# Patient Record
Sex: Female | Born: 1965 | Race: White | Hispanic: No | Smoking: Current every day smoker
Health system: Southern US, Community
[De-identification: ages and names within clinical notes are randomized; demographics above are authoritative.]

## PROBLEM LIST (undated history)

## (undated) DIAGNOSIS — J449 Chronic obstructive pulmonary disease, unspecified: Secondary | ICD-10-CM

## (undated) DIAGNOSIS — Z803 Family history of malignant neoplasm of breast: Secondary | ICD-10-CM

## (undated) DIAGNOSIS — T7840XA Allergy, unspecified, initial encounter: Secondary | ICD-10-CM

## (undated) DIAGNOSIS — I10 Essential (primary) hypertension: Secondary | ICD-10-CM

## (undated) DIAGNOSIS — Z860100 Personal history of colon polyps, unspecified: Secondary | ICD-10-CM

## (undated) HISTORY — DX: Family history of malignant neoplasm of breast: Z80.3

## (undated) HISTORY — DX: Personal history of colonic polyps: Z86.010

## (undated) HISTORY — DX: Personal history of colon polyps, unspecified: Z86.0100

## (undated) HISTORY — DX: Allergy, unspecified, initial encounter: T78.40XA

## (undated) HISTORY — DX: Essential (primary) hypertension: I10

## (undated) HISTORY — PX: BREAST BIOPSY: SHX20

## (undated) HISTORY — PX: REVISION OF SCAR ON FACE/HEAD: SHX2349

## (undated) SURGERY — Surgical Case
Anesthesia: *Unknown

---

## 2001-06-30 ENCOUNTER — Emergency Department (HOSPITAL_COMMUNITY): Admission: EM | Admit: 2001-06-30 | Discharge: 2001-06-30 | Payer: Self-pay | Admitting: Emergency Medicine

## 2004-09-12 ENCOUNTER — Ambulatory Visit (HOSPITAL_COMMUNITY): Admission: RE | Admit: 2004-09-12 | Discharge: 2004-09-12 | Payer: Self-pay | Admitting: *Deleted

## 2004-09-28 ENCOUNTER — Ambulatory Visit: Payer: Self-pay | Admitting: Obstetrics & Gynecology

## 2004-10-05 ENCOUNTER — Ambulatory Visit (HOSPITAL_COMMUNITY): Admission: RE | Admit: 2004-10-05 | Discharge: 2004-10-05 | Payer: Self-pay | Admitting: *Deleted

## 2004-10-05 ENCOUNTER — Ambulatory Visit: Payer: Self-pay | Admitting: *Deleted

## 2004-10-09 ENCOUNTER — Inpatient Hospital Stay (HOSPITAL_COMMUNITY): Admission: AD | Admit: 2004-10-09 | Discharge: 2004-10-09 | Payer: Self-pay | Admitting: *Deleted

## 2004-10-12 ENCOUNTER — Ambulatory Visit: Payer: Self-pay | Admitting: Obstetrics & Gynecology

## 2004-10-18 ENCOUNTER — Ambulatory Visit: Payer: Self-pay | Admitting: Obstetrics and Gynecology

## 2004-10-18 ENCOUNTER — Inpatient Hospital Stay (HOSPITAL_COMMUNITY): Admission: AD | Admit: 2004-10-18 | Discharge: 2004-10-20 | Payer: Self-pay | Admitting: *Deleted

## 2004-11-02 ENCOUNTER — Ambulatory Visit: Payer: Self-pay | Admitting: Obstetrics and Gynecology

## 2004-12-11 ENCOUNTER — Ambulatory Visit (HOSPITAL_COMMUNITY): Admission: RE | Admit: 2004-12-11 | Discharge: 2004-12-11 | Payer: Self-pay | Admitting: Obstetrics and Gynecology

## 2004-12-11 ENCOUNTER — Ambulatory Visit: Payer: Self-pay | Admitting: Obstetrics and Gynecology

## 2004-12-28 ENCOUNTER — Ambulatory Visit: Payer: Self-pay | Admitting: Obstetrics and Gynecology

## 2011-01-19 ENCOUNTER — Emergency Department (HOSPITAL_COMMUNITY)
Admission: EM | Admit: 2011-01-19 | Discharge: 2011-01-19 | Disposition: A | Discharge status: Home / Self Care | Payer: PRIVATE HEALTH INSURANCE | Attending: Emergency Medicine | Admitting: Emergency Medicine

## 2011-01-19 ENCOUNTER — Emergency Department (HOSPITAL_COMMUNITY): Payer: PRIVATE HEALTH INSURANCE

## 2011-01-19 DIAGNOSIS — R0989 Other specified symptoms and signs involving the circulatory and respiratory systems: Secondary | ICD-10-CM | POA: Insufficient documentation

## 2011-01-19 DIAGNOSIS — R079 Chest pain, unspecified: Secondary | ICD-10-CM | POA: Insufficient documentation

## 2011-01-19 DIAGNOSIS — I10 Essential (primary) hypertension: Secondary | ICD-10-CM | POA: Insufficient documentation

## 2011-01-19 DIAGNOSIS — R05 Cough: Secondary | ICD-10-CM | POA: Insufficient documentation

## 2011-01-19 DIAGNOSIS — R0609 Other forms of dyspnea: Secondary | ICD-10-CM | POA: Insufficient documentation

## 2011-01-19 DIAGNOSIS — R0602 Shortness of breath: Secondary | ICD-10-CM | POA: Insufficient documentation

## 2011-01-19 DIAGNOSIS — M25519 Pain in unspecified shoulder: Secondary | ICD-10-CM | POA: Insufficient documentation

## 2011-01-19 DIAGNOSIS — R059 Cough, unspecified: Secondary | ICD-10-CM | POA: Insufficient documentation

## 2011-01-19 DIAGNOSIS — J40 Bronchitis, not specified as acute or chronic: Secondary | ICD-10-CM | POA: Insufficient documentation

## 2011-01-19 LAB — DIFFERENTIAL
Basophils Absolute: 0 10*3/uL (ref 0.0–0.1)
Basophils Relative: 1 % (ref 0–1)
Eosinophils Absolute: 0.9 10*3/uL — ABNORMAL HIGH (ref 0.0–0.7)
Eosinophils Relative: 11 % — ABNORMAL HIGH (ref 0–5)
Lymphocytes Relative: 25 % (ref 12–46)
Lymphs Abs: 2.2 10*3/uL (ref 0.7–4.0)
Monocytes Absolute: 0.4 10*3/uL (ref 0.1–1.0)
Monocytes Relative: 4 % (ref 3–12)
Neutro Abs: 5.3 10*3/uL (ref 1.7–7.7)
Neutrophils Relative %: 60 % (ref 43–77)

## 2011-01-19 LAB — CBC
HCT: 39.2 % (ref 36.0–46.0)
Hemoglobin: 13.4 g/dL (ref 12.0–15.0)
MCH: 30.8 pg (ref 26.0–34.0)
MCHC: 34.2 g/dL (ref 30.0–36.0)
MCV: 90.1 fL (ref 78.0–100.0)
Platelets: 236 10*3/uL (ref 150–400)
RBC: 4.35 MIL/uL (ref 3.87–5.11)
RDW: 14.1 % (ref 11.5–15.5)
WBC: 8.8 10*3/uL (ref 4.0–10.5)

## 2011-01-19 LAB — POCT I-STAT, CHEM 8
BUN: 4 mg/dL — ABNORMAL LOW (ref 6–23)
Calcium, Ion: 1.11 mmol/L — ABNORMAL LOW (ref 1.12–1.32)
Chloride: 106 mEq/L (ref 96–112)
Creatinine, Ser: 0.8 mg/dL (ref 0.4–1.2)
Glucose, Bld: 93 mg/dL (ref 70–99)
HCT: 43 % (ref 36.0–46.0)
Hemoglobin: 14.6 g/dL (ref 12.0–15.0)
Potassium: 3.6 mEq/L (ref 3.5–5.1)
Sodium: 140 mEq/L (ref 135–145)
TCO2: 22 mmol/L (ref 0–100)

## 2011-04-05 ENCOUNTER — Other Ambulatory Visit: Payer: Self-pay | Admitting: Emergency Medicine

## 2011-04-05 DIAGNOSIS — Z1231 Encounter for screening mammogram for malignant neoplasm of breast: Secondary | ICD-10-CM

## 2011-04-12 ENCOUNTER — Ambulatory Visit
Admission: RE | Admit: 2011-04-12 | Discharge: 2011-04-12 | Disposition: A | Discharge status: Home / Self Care | Payer: PRIVATE HEALTH INSURANCE | Source: Ambulatory Visit | Attending: Emergency Medicine | Admitting: Emergency Medicine

## 2011-04-12 DIAGNOSIS — Z1231 Encounter for screening mammogram for malignant neoplasm of breast: Secondary | ICD-10-CM

## 2011-04-13 ENCOUNTER — Other Ambulatory Visit: Payer: Self-pay | Admitting: Emergency Medicine

## 2011-04-13 DIAGNOSIS — R928 Other abnormal and inconclusive findings on diagnostic imaging of breast: Secondary | ICD-10-CM

## 2011-04-20 ENCOUNTER — Other Ambulatory Visit: Payer: Self-pay | Admitting: Diagnostic Radiology

## 2011-04-20 ENCOUNTER — Other Ambulatory Visit: Payer: Self-pay | Admitting: Emergency Medicine

## 2011-04-20 ENCOUNTER — Ambulatory Visit
Admission: RE | Admit: 2011-04-20 | Discharge: 2011-04-20 | Disposition: A | Discharge status: Home / Self Care | Payer: PRIVATE HEALTH INSURANCE | Source: Ambulatory Visit | Attending: Emergency Medicine | Admitting: Emergency Medicine

## 2011-04-20 ENCOUNTER — Ambulatory Visit (HOSPITAL_COMMUNITY): Admission: RE | Admit: 2011-04-20 | Payer: PRIVATE HEALTH INSURANCE | Source: Ambulatory Visit

## 2011-04-20 DIAGNOSIS — R928 Other abnormal and inconclusive findings on diagnostic imaging of breast: Secondary | ICD-10-CM

## 2011-11-26 ENCOUNTER — Other Ambulatory Visit: Payer: Self-pay

## 2011-11-26 ENCOUNTER — Emergency Department (HOSPITAL_COMMUNITY)
Admission: EM | Admit: 2011-11-26 | Discharge: 2011-11-26 | Disposition: A | Discharge status: Home / Self Care | Payer: No Typology Code available for payment source | Attending: Emergency Medicine | Admitting: Emergency Medicine

## 2011-11-26 ENCOUNTER — Emergency Department (HOSPITAL_COMMUNITY): Payer: No Typology Code available for payment source

## 2011-11-26 ENCOUNTER — Encounter (HOSPITAL_COMMUNITY): Payer: Self-pay | Admitting: Emergency Medicine

## 2011-11-26 DIAGNOSIS — M2550 Pain in unspecified joint: Secondary | ICD-10-CM | POA: Insufficient documentation

## 2011-11-26 DIAGNOSIS — J45909 Unspecified asthma, uncomplicated: Secondary | ICD-10-CM | POA: Insufficient documentation

## 2011-11-26 DIAGNOSIS — R0609 Other forms of dyspnea: Secondary | ICD-10-CM | POA: Insufficient documentation

## 2011-11-26 DIAGNOSIS — IMO0001 Reserved for inherently not codable concepts without codable children: Secondary | ICD-10-CM | POA: Insufficient documentation

## 2011-11-26 DIAGNOSIS — R509 Fever, unspecified: Secondary | ICD-10-CM | POA: Insufficient documentation

## 2011-11-26 DIAGNOSIS — J159 Unspecified bacterial pneumonia: Secondary | ICD-10-CM | POA: Insufficient documentation

## 2011-11-26 DIAGNOSIS — R05 Cough: Secondary | ICD-10-CM | POA: Insufficient documentation

## 2011-11-26 DIAGNOSIS — F17200 Nicotine dependence, unspecified, uncomplicated: Secondary | ICD-10-CM | POA: Insufficient documentation

## 2011-11-26 DIAGNOSIS — R197 Diarrhea, unspecified: Secondary | ICD-10-CM | POA: Insufficient documentation

## 2011-11-26 DIAGNOSIS — R0602 Shortness of breath: Secondary | ICD-10-CM | POA: Insufficient documentation

## 2011-11-26 DIAGNOSIS — R059 Cough, unspecified: Secondary | ICD-10-CM | POA: Insufficient documentation

## 2011-11-26 DIAGNOSIS — R0989 Other specified symptoms and signs involving the circulatory and respiratory systems: Secondary | ICD-10-CM | POA: Insufficient documentation

## 2011-11-26 DIAGNOSIS — R1110 Vomiting, unspecified: Secondary | ICD-10-CM | POA: Insufficient documentation

## 2011-11-26 HISTORY — DX: Chronic obstructive pulmonary disease, unspecified: J44.9

## 2011-11-26 MED ORDER — ALBUTEROL SULFATE (5 MG/ML) 0.5% IN NEBU
INHALATION_SOLUTION | RESPIRATORY_TRACT | Status: AC
  Filled 2011-11-26: qty 0.5

## 2011-11-26 MED ORDER — ALBUTEROL SULFATE HFA 108 (90 BASE) MCG/ACT IN AERS: 2.0000 | INHALATION_SPRAY | RESPIRATORY_TRACT | Status: DC | PRN

## 2011-11-26 MED ORDER — AMOXICILLIN 500 MG PO CAPS
ORAL_CAPSULE | ORAL | Status: AC
  Filled 2011-11-26: qty 2

## 2011-11-26 MED ORDER — AMOXICILLIN 500 MG PO CAPS
1000.0000 mg | ORAL_CAPSULE | Freq: Once | ORAL | Status: AC
  Administered 2011-11-26: 1000 mg via ORAL

## 2011-11-26 MED ORDER — IPRATROPIUM BROMIDE 0.02 % IN SOLN
RESPIRATORY_TRACT | Status: AC
  Filled 2011-11-26: qty 2.5

## 2011-11-26 MED ORDER — ALBUTEROL SULFATE (5 MG/ML) 0.5% IN NEBU
2.5000 mg | INHALATION_SOLUTION | Freq: Once | RESPIRATORY_TRACT | Status: AC
  Administered 2011-11-26: 2.5 mg via RESPIRATORY_TRACT

## 2011-11-26 MED ORDER — PREDNISONE 50 MG PO TABS: 50.0000 mg | ORAL_TABLET | Freq: Every day | ORAL | Status: DC

## 2011-11-26 MED ORDER — PREDNISONE 20 MG PO TABS
60.0000 mg | ORAL_TABLET | Freq: Once | ORAL | Status: AC
  Administered 2011-11-26: 60 mg via ORAL
  Filled 2011-11-26: qty 3

## 2011-11-26 MED ORDER — IPRATROPIUM BROMIDE 0.02 % IN SOLN
0.5000 mg | Freq: Once | RESPIRATORY_TRACT | Status: AC
  Administered 2011-11-26: 0.5 mg via RESPIRATORY_TRACT

## 2011-11-26 MED ORDER — AMOXICILLIN 500 MG PO CAPS: 1000.0000 mg | ORAL_CAPSULE | Freq: Three times a day (TID) | ORAL | Status: AC

## 2011-11-26 NOTE — ED Notes (Signed)
Per Pt- Pt states that she has been getting increasingly short of breath especially when she tries to go to bed. Pt states she has COPD, current smoker, approx.  1 pack a day.

## 2011-11-26 NOTE — ED Provider Notes (Addendum)
History     CSN: [id number]  Arrival date & time 11/26/11  0508   First MD Initiated Contact with Patient 11/26/11 [id number]      Chief Complaint  Patient presents with  . Shortness of Breath    (Consider location/radiation/quality/duration/timing/severity/associated sxs/prior treatment) Patient is a 45 y.o. female presenting with shortness of breath. The history is provided by the patient.  Shortness of Breath  Associated symptoms include shortness of breath.   For the last 3 days, she has had a cough productive of green sputum and low-grade fevers and chills. She's also complaining of post tussive emesis and diarrhea. She has had generalized myalgias and arthralgias. Body pains are severe and she rates them at 8/10. She has noted increasing dyspnea with this although that would dyspnea tends to wax and wane. She gets temporary relief with using her inhalers. Breathing tends to be especially worse at night. She continues to smoke one pack of cigarettes a day. She was diagnosed with COPD one year ago.  No past medical history on file.  No past surgical history on file.  No family history on file.  History  Substance Use Topics  . Smoking status: Not on file  . Smokeless tobacco: Not on file  . Alcohol Use: Not on file    OB History    Grav Para Term Preterm Abortions TAB SAB Ect Mult Living                  Review of Systems  Respiratory: Positive for shortness of breath.   All other systems reviewed and are negative.    Allergies  Review of patient's allergies indicates no known allergies.  Home Medications  No current outpatient prescriptions on file.  There were no vitals taken for this visit.  Physical Exam  Nursing note and vitals reviewed.  45 year old female who appears moderately dyspneic. Vital signs are significant for tachycardia with heart rate 108. Oxygen saturation is 95% which is normal. Head is normocephalic and atraumatic. There is no sinus  tenderness. PERRLA, EOMI. Oropharynx is clear. Neck is supple without adenopathy or JVD. Lungs have diminished air flow, prolonged exhalation phase, with slight expiratory wheezing. Heart has regular rate and rhythm without murmur. Abdomen is soft, flat, nontender without masses or hepatosplenomegaly and peristalsis is present. Extremities have no cyanosis or edema, full range of motion present. Skin is warm and moist without rash. Neurologic: Mental status is normal, cranial nerves are intact, there no focal motor or sensory deficits. Psychiatric: No abnormalities of mood or affect.  ED Course  Procedures (including critical care time)  Labs Reviewed - No data to display No results found.  Results for orders placed during the hospital encounter of 01/19/11  POCT I-STAT, CHEM 8      Component Value Range   Sodium 140  135 - 145 (mEq/L)   Potassium 3.6  3.5 - 5.1 (mEq/L)   Chloride 106  96 - 112 (mEq/L)   BUN 4 (*) 6 - 23 (mg/dL)   Creatinine, Ser 0.8  0.4 - 1.2 (mg/dL)   Glucose, Bld 93  70 - 99 (mg/dL)   Calcium, Ion 5.78 (*) 1.12 - 1.32 (mmol/L)   TCO2 22  0 - 100 (mmol/L)   Hemoglobin 14.6  12.0 - 15.0 (g/dL)   HCT 46.9  62.9 - 52.8 (%)  DIFFERENTIAL      Component Value Range   Neutrophils Relative 60  43 - 77 (%)   Neutro Abs 5.3  1.7 - 7.7 (K/uL)   Lymphocytes Relative 25  12 - 46 (%)   Lymphs Abs 2.2  0.7 - 4.0 (K/uL)   Monocytes Relative 4  3 - 12 (%)   Monocytes Absolute 0.4  0.1 - 1.0 (K/uL)   Eosinophils Relative 11 (*) 0 - 5 (%)   Eosinophils Absolute 0.9 (*) 0.0 - 0.7 (K/uL)   Basophils Relative 1  0 - 1 (%)   Basophils Absolute 0.0  0.0 - 0.1 (K/uL)  CBC      Component Value Range   WBC 8.8  4.0 - 10.5 (K/uL)   RBC 4.35  3.87 - 5.11 (MIL/uL)   Hemoglobin 13.4  12.0 - 15.0 (g/dL)   HCT 62.9  52.8 - 41.3 (%)   MCV 90.1  78.0 - 100.0 (fL)   MCH 30.8  26.0 - 34.0 (pg)   MCHC 34.2  30.0 - 36.0 (g/dL)   RDW 24.4  01.0 - 27.2 (%)   Platelets 236  150 - 400 (K/uL)    Dg Chest 2 View  11/26/2011  *RADIOLOGY REPORT*  Clinical Data: Cough and fever; history of smoking.  CHEST - 2 VIEW  Comparison: Chest radiograph performed 01/19/2011  Findings: The lungs are well-aerated.  There is suggestion of minimal right basilar opacity, on comparison with the prior study, also characterized on the lateral view.  There is no evidence of pleural effusion or pneumothorax.  The heart is normal in size; the mediastinal contour is within normal limits.  No acute osseous abnormalities are seen.  IMPRESSION: Suspect minimal right lower lobe pneumonia.  Original Report Authenticated By: Tonia Ghent, M.D.   ECG shows normal sinus rhythm with a rate of 100, no ectopy. Normal axis. Normal P wave. Normal QRS. Normal intervals. Normal ST and T waves. Impression: normal ECG. When compared with ECG of 01/19/2011, no significant change is seen.   No diagnosis found.  She's given an albuterol and Atrovent nebulizer treatment and an oral dose of prednisone. Following this she is feeling much better and on auscultation the lungs are clear. Chest x-ray has shown a probable early pneumonia. She is started on amoxicillin for this. She will be sent home with a five-day course of prednisone 50 mg a day and a prescription for amoxicillin 1 g 3 times a day. She's advised to stop smoking.  MDM  Exacerbation of COPD which is possibly related to influenza, rule out pneumonia.        Dione Booze, MD 11/26/11 5366  Dione Booze, MD 11/26/11 [phone]

## 2016-01-07 ENCOUNTER — Encounter (HOSPITAL_COMMUNITY): Payer: Self-pay | Admitting: Emergency Medicine

## 2016-01-07 ENCOUNTER — Emergency Department (HOSPITAL_COMMUNITY)
Admission: EM | Admit: 2016-01-07 | Discharge: 2016-01-07 | Disposition: A | Discharge status: Home / Self Care | Payer: No Typology Code available for payment source | Attending: Emergency Medicine | Admitting: Emergency Medicine

## 2016-01-07 ENCOUNTER — Emergency Department (HOSPITAL_COMMUNITY): Payer: No Typology Code available for payment source

## 2016-01-07 DIAGNOSIS — F17200 Nicotine dependence, unspecified, uncomplicated: Secondary | ICD-10-CM | POA: Insufficient documentation

## 2016-01-07 DIAGNOSIS — Z7951 Long term (current) use of inhaled steroids: Secondary | ICD-10-CM | POA: Insufficient documentation

## 2016-01-07 DIAGNOSIS — Z79899 Other long term (current) drug therapy: Secondary | ICD-10-CM | POA: Insufficient documentation

## 2016-01-07 DIAGNOSIS — J441 Chronic obstructive pulmonary disease with (acute) exacerbation: Secondary | ICD-10-CM | POA: Insufficient documentation

## 2016-01-07 DIAGNOSIS — R059 Cough, unspecified: Secondary | ICD-10-CM

## 2016-01-07 DIAGNOSIS — M549 Dorsalgia, unspecified: Secondary | ICD-10-CM | POA: Insufficient documentation

## 2016-01-07 DIAGNOSIS — R05 Cough: Secondary | ICD-10-CM

## 2016-01-07 DIAGNOSIS — R0602 Shortness of breath: Secondary | ICD-10-CM

## 2016-01-07 LAB — CBC
HEMATOCRIT: 40.7 % (ref 36.0–46.0)
HEMOGLOBIN: 13.6 g/dL (ref 12.0–15.0)
MCH: 31.3 pg (ref 26.0–34.0)
MCHC: 33.4 g/dL (ref 30.0–36.0)
MCV: 93.6 fL (ref 78.0–100.0)
Platelets: 243 10*3/uL (ref 150–400)
RBC: 4.35 MIL/uL (ref 3.87–5.11)
RDW: 13.3 % (ref 11.5–15.5)
WBC: 7.2 10*3/uL (ref 4.0–10.5)

## 2016-01-07 LAB — BASIC METABOLIC PANEL
ANION GAP: 11 (ref 5–15)
BUN: 8 mg/dL (ref 6–20)
CHLORIDE: 108 mmol/L (ref 101–111)
CO2: 25 mmol/L (ref 22–32)
Calcium: 9.4 mg/dL (ref 8.9–10.3)
Creatinine, Ser: 0.79 mg/dL (ref 0.44–1.00)
GFR calc Af Amer: >60 mL/min (ref >60)
GFR calc non Af Amer: >60 mL/min (ref >60)
GLUCOSE: 100 mg/dL — AB (ref 65–99)
POTASSIUM: 3.7 mmol/L (ref 3.5–5.1)
Sodium: 144 mmol/L (ref 135–145)

## 2016-01-07 LAB — BRAIN NATRIURETIC PEPTIDE: B Natriuretic Peptide: 37.6 pg/mL (ref 0.0–100.0)

## 2016-01-07 LAB — I-STAT TROPONIN, ED: Troponin i, poc: 0 ng/mL (ref 0.00–0.08)

## 2016-01-07 MED ORDER — PREDNISONE 20 MG PO TABS
60.0000 mg | ORAL_TABLET | Freq: Once | ORAL | Status: AC
  Administered 2016-01-07: 60 mg via ORAL
  Filled 2016-01-07: qty 3

## 2016-01-07 MED ORDER — ALBUTEROL SULFATE HFA 108 (90 BASE) MCG/ACT IN AERS
2.0000 | INHALATION_SPRAY | Freq: Once | RESPIRATORY_TRACT | Status: AC
  Administered 2016-01-07: 2 via RESPIRATORY_TRACT
  Filled 2016-01-07: qty 6.7

## 2016-01-07 MED ORDER — BUDESONIDE-FORMOTEROL FUMARATE 160-4.5 MCG/ACT IN AERO
2.0000 | INHALATION_SPRAY | Freq: Two times a day (BID) | RESPIRATORY_TRACT | Status: DC
  Administered 2016-01-07: 2 via RESPIRATORY_TRACT
  Filled 2016-01-07: qty 6

## 2016-01-07 MED ORDER — GUAIFENESIN ER 600 MG PO TB12: 600.0000 mg | ORAL_TABLET | Freq: Two times a day (BID) | ORAL | Status: DC

## 2016-01-07 MED ORDER — BENZONATATE 100 MG PO CAPS: 100.0000 mg | ORAL_CAPSULE | Freq: Three times a day (TID) | ORAL | Status: DC

## 2016-01-07 MED ORDER — IPRATROPIUM-ALBUTEROL 0.5-2.5 (3) MG/3ML IN SOLN
3.0000 mL | Freq: Once | RESPIRATORY_TRACT | Status: AC
  Administered 2016-01-07: 3 mL via RESPIRATORY_TRACT
  Filled 2016-01-07: qty 3

## 2016-01-07 MED ORDER — ALBUTEROL SULFATE HFA 108 (90 BASE) MCG/ACT IN AERS: 2.0000 | INHALATION_SPRAY | RESPIRATORY_TRACT | Status: DC | PRN

## 2016-01-07 MED ORDER — ALBUTEROL SULFATE (2.5 MG/3ML) 0.083% IN NEBU
2.5000 mg | INHALATION_SOLUTION | Freq: Once | RESPIRATORY_TRACT | Status: AC
  Administered 2016-01-07: 2.5 mg via RESPIRATORY_TRACT
  Filled 2016-01-07: qty 3

## 2016-01-07 MED ORDER — AZITHROMYCIN 250 MG PO TABS
500.0000 mg | ORAL_TABLET | Freq: Once | ORAL | Status: AC
  Administered 2016-01-07: 500 mg via ORAL
  Filled 2016-01-07: qty 2

## 2016-01-07 MED ORDER — AZITHROMYCIN 250 MG PO TABS: 250.0000 mg | ORAL_TABLET | Freq: Every day | ORAL | Status: DC

## 2016-01-07 MED ORDER — BUDESONIDE-FORMOTEROL FUMARATE 160-4.5 MCG/ACT IN AERO: 2.0000 | INHALATION_SPRAY | Freq: Two times a day (BID) | RESPIRATORY_TRACT | Status: DC

## 2016-01-07 NOTE — ED Notes (Signed)
Pt A&OX4, ambulatory at d/c with steady gait, NAD 

## 2016-01-07 NOTE — ED Notes (Signed)
This RN just called pharmacy to find out when the inhaler would be sent and this RN was informed they will tube it now.

## 2016-01-07 NOTE — ED Notes (Signed)
This RN has called case management and left a message regarding patient.

## 2016-01-07 NOTE — Discharge Instructions (Signed)
You have been seen today for shortness of breath and a cough. Your imaging and lab tests showed no abnormalities. Follow up with PCP as soon as possible for reevaluation and chronic management of this issue. Return to ED should symptoms worsen. Please take all of your antibiotics until finished!   You may develop abdominal discomfort or diarrhea from the antibiotic.  You may help offset this with probiotics which you can buy or get in yogurt. Do not eat or take the probiotics until 2 hours after your antibiotic.    Emergency Department Resource Guide 1) Find a Doctor and Pay Out of Pocket Although you won't have to find out who is covered by your insurance plan, it is a good idea to ask around and get recommendations. You will then need to call the office and see if the doctor you have chosen will accept you as a new patient and what types of options they offer for patients who are self-pay. Some doctors offer discounts or will set up payment plans for their patients who do not have insurance, but you will need to ask so you aren't surprised when you get to your appointment.  2) Contact Your Local Health Department Not all health departments have doctors that can see patients for sick visits, but many do, so it is worth a call to see if yours does. If you don't know where your local health department is, you can check in your phone book. The CDC also has a tool to help you locate your state's health department, and many state websites also have listings of all of their local health departments.  3) Find a [facility] If your illness is not likely to be very severe or complicated, you may want to try a walk in clinic. These are popping up all over the country in pharmacies, drugstores, and shopping centers. They're usually staffed by nurse practitioners or physician assistants that have been trained to treat common illnesses and complaints. They're usually fairly quick and inexpensive. However, if you  have serious medical issues or chronic medical problems, these are probably not your best option.  No Primary Care Doctor: - Call Health Connect at  [phone] - they can help you locate a primary care doctor that  accepts your insurance, provides certain services, etc. - Physician Referral Service- [phone]  Chronic Pain Problems: Organization         Address  Phone   Notes  [facility]  [phone] Patients need to be referred by their primary care doctor.   Medication Assistance: Organization         Address  Phone   Notes  [facility] Medication [facility] [address] [place], [place] [address] [phone] --Must be a resident of [facility] -- Must have NO insurance coverage whatsoever (no Medicaid/ Medicare, etc.) -- The pt. MUST have a primary care doctor that directs their care regularly and follows them in the community   MedAssist  [phone]   [employer]  [phone]    Agencies that provide inexpensive medical care: Organization         Address  Phone   Notes  [place]  [phone]   [facility]    [phone]   [facility] [place],  [address] [phone]   [place] [address] [place] [phone]   Planned  P[facility]thood    [phone]   [facility]    [phone]   Community [facility] and [place] [address], [place] Phone:  [phone], Fax:  [phone] Hours of Operation:  9 am - 6 pm, M-F.  Also accepts Medicaid/Medicare and self-pay.  [facility] [place], [address], [place] Phone: [phone], Fax: [phone]. Hours of Operation:  8:30 am - 5:30 pm, M-F.  Also accepts Medicaid and self-pay.  [facility] [place] [address], [place] Phone: [phone]   [place], [place], [place] [phone], Ext. [phone] Mondays & Thursdays: 7-9 AM.  First 15 patients are seen on a first come, first serve basis.    [place] Providers:  Organization         Address  Phone   Notes  [facility] [address], [place] [phone] Also accepts self-pay patients.  [facility] [address], [place]  [phone]   [place], [address], [place] [phone]   [facility] [address] [place] [phone]   Lucianne Lei [address], [place]   [phone] Only accepts [place] Access [place] patients after they have their name applied to their card.   Self-Pay (no insurance) in [facility]:  Organization         Address  Phone   Notes  Sickle Cell Patients, [facility] Internal [facility] [place] [phone]   [facility] [place] [phone]   [facility] [place]  [place], [place], [place] [phone]   [facility]/Dr. Osei-Bonsu  [address] [place] or [address], [place] [phone] Phone number for both [place] and [place] locations is the same.  Urgent Medical and [facility] [address] [place] [phone]   [facility] [address] [place] or [address] [phone]   [facility] [address] [place] [phone], phone; [phone], fax Sees patients 1st and 3rd Saturday of every month.  Must not qualify for public or private insurance (i.e. Medicaid, Medicare, [place] Health Choice, Veterans' Benefits)  Household income should be no more than 200% of the poverty level The clinic cannot treat you if you are pregnant or think you are pregnant   Sexually transmitted diseases are not treated at the clinic.    Dental Care: Organization         Address  Phone  Notes  [facility] Department of [facility] [phone] Accepts children up to age 4 who are enrolled in [place] or [place]; pregnant women with a Medicaid card; and children who have applied for Medicaid or [place] Health Choice, but were declined, whose parents can pay a reduced fee at time of service.  [facility] Department of [facility]  [address], [place] [phone] Accepts children up to age 19 who are enrolled in [place] or [place]; pregnant women with a Medicaid card; and children who have applied for Medicaid or [place], but were declined, whose  parents can pay a reduced fee at time of service.  [place] Adult Dental Access PROGRAM  [place] [phone] Patients are seen by appointment only. Walk-ins are not accepted. [place] will see patients 69 years of age and older. Monday - Tuesday (8am-5pm) Most Wednesdays (8:30-5pm) $30 per visit, cash only  [facility] Adult Dental Access PROGRAM  [address], [facility] [phone] Patients are seen by appointment only. Walk-ins are not accepted. [place] will see patients 85 years of age and older. One Wednesday Evening (Monthly: Volunteer Based).  $30 per visit, cash only  [place]  [phone] for adults; Children under age 3, call Graduate Pediatric Dentistry at [phone]. Children aged 68-14, please call [phone] to request a pediatric application.  Dental services are provided in all areas of dental care including fillings, crowns and bridges, complete and partial dentures, implants, gum treatment, root canals, and extractions. Preventive care is also provided. Treatment is provided to both adults and children. Patients  are selected via a lottery and there is often a waiting list.   [facility] [address], [place]  [phone] [email or web address]   [facility] [address] [place], [place] [phone], Ext. [phone] Second and Fourth Thursday of each month, opens at 6:30 AM; Clinic ends at 9 AM.  Patients are seen on a first-come first-served basis, and a limited number are seen during each clinic.   [facility]  [address], [place] [phone]   Eligibility Requirements You must have lived in [place], [place], or [place] counties for at least the last three months.   You cannot be eligible for state or federal sponsored [employer], including [employer], [place], or [employer].   You generally cannot be eligible for healthcare insurance through your employer.    How to apply: Eligibility screenings are held every Tuesday and Wednesday afternoon from 1:00 pm until 4:00 pm. You do not need an appointment for the interview!  [facility] [address], [place], [place]   [place]  [place] Department  [place]  [phone]    Behavioral Health Resources in the Community: Intensive Outpatient Programs Organization         Address  Phone  Notes  [facility] [place]. [address], [place], [place] [phone]   [facility] Outpatient [address] [place], [place]   ADS: Alcohol & Drug Svcs [address], [place], [place]   [place] [address]  [place], [place] or [phone]   Substance Abuse Resources Organization         Address  Phone  Notes  Alcohol and Drug Services  [phone]   [place]  [phone]   [place]   Chinita Pester  [phone]    Residential & Outpatient Substance Abuse Program  [phone]   Psychological Services Organization         Address  Phone  Notes  [facility] [place]  [place]  [phone]   [place] [address], [place] or [phone]    Mobile Crisis Teams Organization         Address  Phone  Notes  [facility]rapeutic Alternatives, Mobile Crisis  Care Unit  [phone]   [facility] Psychotherapeutic Services  [address] [place], [place]   [facility] [address] [place] [phone]    Self-Help/Support Groups Organization         Address  Phone             Notes  [place]. of [place] - variety of support groups  [place] Call for more information  Narcotics Anonymous (NA), [facility] [address], [employer] [place]  2 meetings at this location   Special educational needs teacher         Address  Phone  Notes  ASAP Residential Treatment [place],    [place]  [phone]   [facility]  [address] [place] [id number], [place], [place]   [place], [place] [phone] Admissions: 8am-3pm M-F  Incentives Substance [place] [address]    [place], [place] [phone]   [facility] [address] [place], [place], [place]   [facility] [address]  [place], [place]   Insight Programs - Intensive Outpatient [address], [place], [place]   [facility] [place].,  [place], [place] [phone] or [phone]   [facility]idential Treatment Services (RTS) [address], [place], [place] Accepts Medicaid  [facility] [address],  [place] [phone] Substance Abuse/Addiction Treatment   [facility] Organization         Address  Phone  Notes  [facility]  [phone]   Domenic Schwab, PhD [address] [place]lis [place], [place]   [phone] or [phone]   [place], [place] [phone]   [facility] [address] [place], [place] [phone] Insurance/Medicaid/sponsorship through [facility] and Families [address]                                    [place], [place] [phone] [place] [address], [place] [phone]    Dr. Adele Schilder  [phone]   [facility] Dept. 1) [address], [place] 2) [place] 3)  [place] [address], [place] [phone]  [phone]   [place] [phone] or [phone] (After Hours)

## 2016-01-07 NOTE — ED Provider Notes (Signed)
CSN: [id number]     Arrival date & time 01/07/16  1437 History   First MD Initiated Contact with Patient 01/07/16 1653     Chief Complaint  Patient presents with  . Shortness of Breath  . Cough  . Back Pain     (Consider location/radiation/quality/duration/timing/severity/associated sxs/prior Treatment) HPI   Tanya Jordan is a 50 y.o. female, with a history of COPD, presenting to the ED with increased shortness of breath accompanied by a productive cough with clear sputum for the past 5 days. Pt states that she has not had her inhalers for weeks, due to not having insurance. Pt has not taken anything for the cough or other symptoms. Pt denies fever/chills, N/V, chest pain, abdominal pain, or any other complaints. Pt is a 1.5 PPD smoker.    Past Medical History  Diagnosis Date  . COPD (chronic obstructive pulmonary disease) ([place])    History reviewed. No pertinent past surgical history. No family history on file. Social History  Substance Use Topics  . Smoking status: Current Every Day Smoker -- 1.00 packs/day  . Smokeless tobacco: None  . Alcohol Use: None   OB History    No data available     Review of Systems  Constitutional: Negative for fever, chills and diaphoresis.  Respiratory: Positive for cough and shortness of breath.   Cardiovascular: Negative for chest pain.  Gastrointestinal: Negative for nausea and vomiting.  Neurological: Negative for dizziness, light-headedness and headaches.  All other systems reviewed and are negative.     Allergies  Review of patient's allergies indicates no known allergies.  Home Medications   Prior to Admission medications   Medication Sig Start Date End Date Taking? Authorizing Provider  albuterol (PROVENTIL HFA;VENTOLIN HFA) 108 (90 BASE) MCG/ACT inhaler Inhale 2 puffs into the lungs every 6 (six) hours as needed. For shortness of breath    Historical Provider, MD  albuterol (PROVENTIL HFA;VENTOLIN HFA) 108 (90 BASE) MCG/ACT  inhaler Inhale 2 puffs into the lungs every 4 (four) hours as needed for wheezing (or cough). 11/26/11 34/74/25  Delora Fuel, MD  albuterol (PROVENTIL HFA;VENTOLIN HFA) 108 (90 Base) MCG/ACT inhaler Inhale 2 puffs into the lungs every 4 (four) hours as needed for wheezing or shortness of breath. 01/07/16   Shawn C Joy, PA-C  azithromycin (ZITHROMAX Z-PAK) 250 MG tablet Take 1 tablet (250 mg total) by mouth daily. 01/07/16   Shawn C Joy, PA-C  benzonatate (TESSALON) 100 MG capsule Take 1 capsule (100 mg total) by mouth every 8 (eight) hours. 01/07/16   Shawn C Joy, PA-C  budesonide-formoterol (SYMBICORT) 160-4.5 MCG/ACT inhaler Inhale 2 puffs into the lungs 2 (two) times daily.      Historical Provider, MD  budesonide-formoterol (SYMBICORT) 160-4.5 MCG/ACT inhaler Inhale 2 puffs into the lungs 2 (two) times daily. 01/07/16   Shawn C Joy, PA-C  guaiFENesin (MUCINEX) 600 MG 12 hr tablet Take 1 tablet (600 mg total) by mouth 2 (two) times daily. 01/07/16   Shawn C Joy, PA-C   BP 146/62 mmHg  Pulse 72  Temp(Src) 98 F (36.7 C) (Oral)  Resp 25  Ht '5\' 4"'$  (1.626 m)  Wt 60.13 kg  BMI 22.74 kg/m2  SpO2 98%  LMP 03/29/2011 Physical Exam  Constitutional: She appears well-developed and well-nourished. No distress.  HENT:  Head: Normocephalic and atraumatic.  Eyes: Conjunctivae are normal. Pupils are equal, round, and reactive to light.  Neck: Neck supple.  Cardiovascular: Normal rate, regular rhythm, normal heart sounds and intact  distal pulses.   Pulmonary/Chest: Effort normal. No respiratory distress. She has wheezes in the right upper field, the right middle field, the right lower field, the left upper field, the left middle field and the left lower field.  Patient shows no increased work of breathing and speaks in full sentences without difficulty.  Abdominal: Soft. Bowel sounds are normal. There is no tenderness. There is no guarding.  Musculoskeletal: She exhibits no edema or tenderness.   Lymphadenopathy:    She has no cervical adenopathy.  Neurological: She is alert.  Skin: Skin is warm and dry. She is not diaphoretic.  Nursing note and vitals reviewed.   ED Course  Procedures (including critical care time) Labs Review Labs Reviewed  BASIC METABOLIC PANEL - Abnormal; Notable for the following:    Glucose, Bld 100 (*)    All other components within normal limits  CBC  BRAIN NATRIURETIC PEPTIDE  I-STAT TROPOININ, ED    Imaging Review Dg Chest 2 View  01/07/2016  CLINICAL DATA:  Shortness of breath, cough, and lower back pain for 3 days. EXAM: CHEST  2 VIEW COMPARISON:  11/26/2011 FINDINGS: The cardiomediastinal silhouette is within normal limits. The lungs are hyperinflated. Minimal right lower lung opacity described on the prior study has resolved. There is no evidence of airspace consolidation, edema, pleural effusion, or pneumothorax. Slight peribronchial thickening is chronic. No acute osseous abnormality is identified. IMPRESSION: No active cardiopulmonary disease. Electronically Signed   By: Logan Bores M.D.   On: 01/07/2016 15:58   I have personally reviewed and evaluated these images and lab results as part of my medical decision-making.   EKG Interpretation   Date/Time:  Saturday January 07 2016 14:45:58 EST Ventricular Rate:  91 PR Interval:  110 QRS Duration: 84 QT Interval:  354 QTC Calculation: 435 R Axis:   43 Text Interpretation:  Sinus rhythm with short PR Otherwise normal ECG  agree. no change from old Confirmed by Johnney Killian, MD, Jeannie Done [phone]) on  01/07/2016 6:05:49 PM      MDM   Final diagnoses:  Shortness of breath  Cough    Tanya Jordan presents with a productive cough and increased shortness of breath over the past 5 days.  Findings and plan of care discussed with Charlesetta Shanks, MD.  Suspect an acute exacerbation of COPD and part of this patient's issue is that she has been out of her medications. Case management consult was  entered. Patient is nontoxic appearing, afebrile, not tachycardic, not tachypneic, maintains SPO2 of 97% on room air, and is in no apparent distress. Patient has no signs of sepsis or other serious or life-threatening condition. Chest x-ray shows no evidence of acute infiltrate. Patient treated with breathing treatment, prednisone, and azithromycin. Patient was advised that she would need to follow-up with PCP as soon as possible for chronic management and was given resources to help her do so. Patient was reassessed multiple times while here in the ED and found to improve each time. Patient's lung sounds also improved. The patient was given instructions for home care as well as return precautions. Patient voices understanding of these instructions, accepts the plan, and is comfortable with discharge.   Filed Vitals:   01/07/16 1447 01/07/16 1859 01/07/16 1900  BP: 168/109  146/62  Pulse: 85 87 72  Temp: 98 F (36.7 C)    TempSrc: Oral    Resp: '20 25 25  '$ Height: '5\' 4"'$  (1.626 m)    Weight: 60.13 kg  SpO2: 97% 94% 98%       Lorayne Bender, PA-C 01/07/16 1949  Charlesetta Shanks, MD 01/08/16 2354

## 2016-01-07 NOTE — ED Notes (Signed)
Pt c/o history of COPD. Reports shortness of breath, cough and back pain. Symptoms worse than her normal COPD symptoms.

## 2016-01-14 ENCOUNTER — Encounter (HOSPITAL_COMMUNITY): Payer: Self-pay

## 2016-01-14 ENCOUNTER — Emergency Department (HOSPITAL_COMMUNITY): Payer: No Typology Code available for payment source

## 2016-01-14 ENCOUNTER — Inpatient Hospital Stay (HOSPITAL_COMMUNITY)
Admission: EM | Admit: 2016-01-14 | Discharge: 2016-01-16 | DRG: 190 | Disposition: A | Discharge status: Home / Self Care | Payer: Self-pay | Attending: Internal Medicine | Admitting: Internal Medicine

## 2016-01-14 DIAGNOSIS — J9601 Acute respiratory failure with hypoxia: Secondary | ICD-10-CM | POA: Diagnosis present

## 2016-01-14 DIAGNOSIS — J441 Chronic obstructive pulmonary disease with (acute) exacerbation: Principal | ICD-10-CM | POA: Diagnosis present

## 2016-01-14 DIAGNOSIS — Z23 Encounter for immunization: Secondary | ICD-10-CM

## 2016-01-14 DIAGNOSIS — Z6822 Body mass index (BMI) 22.0-22.9, adult: Secondary | ICD-10-CM

## 2016-01-14 DIAGNOSIS — F17200 Nicotine dependence, unspecified, uncomplicated: Secondary | ICD-10-CM | POA: Diagnosis present

## 2016-01-14 DIAGNOSIS — Z7951 Long term (current) use of inhaled steroids: Secondary | ICD-10-CM

## 2016-01-14 DIAGNOSIS — Z720 Tobacco use: Secondary | ICD-10-CM | POA: Diagnosis present

## 2016-01-14 DIAGNOSIS — J4541 Moderate persistent asthma with (acute) exacerbation: Secondary | ICD-10-CM

## 2016-01-14 DIAGNOSIS — R0602 Shortness of breath: Secondary | ICD-10-CM

## 2016-01-14 DIAGNOSIS — E669 Obesity, unspecified: Secondary | ICD-10-CM | POA: Diagnosis present

## 2016-01-14 DIAGNOSIS — Z79899 Other long term (current) drug therapy: Secondary | ICD-10-CM

## 2016-01-14 LAB — BASIC METABOLIC PANEL
ANION GAP: 13 (ref 5–15)
BUN: 11 mg/dL (ref 6–20)
CALCIUM: 9.5 mg/dL (ref 8.9–10.3)
CO2: 24 mmol/L (ref 22–32)
Chloride: 108 mmol/L (ref 101–111)
Creatinine, Ser: 0.9 mg/dL (ref 0.44–1.00)
GFR calc Af Amer: >60 mL/min (ref >60)
GFR calc non Af Amer: >60 mL/min (ref >60)
GLUCOSE: 146 mg/dL — AB (ref 65–99)
POTASSIUM: 3.9 mmol/L (ref 3.5–5.1)
SODIUM: 145 mmol/L (ref 135–145)

## 2016-01-14 LAB — CBC
HEMATOCRIT: 41.3 % (ref 36.0–46.0)
HEMOGLOBIN: 13.7 g/dL (ref 12.0–15.0)
MCH: 31.2 pg (ref 26.0–34.0)
MCHC: 33.2 g/dL (ref 30.0–36.0)
MCV: 94.1 fL (ref 78.0–100.0)
PLATELETS: 258 10*3/uL (ref 150–400)
RBC: 4.39 MIL/uL (ref 3.87–5.11)
RDW: 13.1 % (ref 11.5–15.5)
WBC: 12.3 10*3/uL — ABNORMAL HIGH (ref 4.0–10.5)

## 2016-01-14 MED ORDER — ALBUTEROL SULFATE (2.5 MG/3ML) 0.083% IN NEBU: 5.0000 mg | INHALATION_SOLUTION | Freq: Once | RESPIRATORY_TRACT | Status: DC

## 2016-01-14 MED ORDER — ALBUTEROL (5 MG/ML) CONTINUOUS INHALATION SOLN
10.0000 mg/h | INHALATION_SOLUTION | RESPIRATORY_TRACT | Status: DC
  Administered 2016-01-14: 10 mg/h via RESPIRATORY_TRACT
  Filled 2016-01-14: qty 20

## 2016-01-14 NOTE — ED Notes (Signed)
Bed: RESB Expected date:  Expected time:  Means of arrival:  Comments: 50 yo F  Shortness of breath

## 2016-01-14 NOTE — ED Notes (Signed)
Pt desaturated to 85% on RA following breathing treatment. Pt placed on 4L [place], SPO2 92% as result

## 2016-01-14 NOTE — ED Notes (Signed)
According to EMS, pt was smoking a cigarette and became SOB. Pt also had anxiety attack. Pt was given '125mg'$  Solumedrol, Epi IM, 2g of Mag IV. Pt has received '15mg'$  of Albuterol. Sinus Tach rate of 124.    SPO2 RA 82%

## 2016-01-14 NOTE — ED Provider Notes (Signed)
CSN: [id number]     Arrival date & time 01/14/16  2204 History   First MD Initiated Contact with Patient 01/14/16 2215     Chief Complaint  Patient presents with  . Shortness of Breath  . Cough     (Consider location/radiation/quality/duration/timing/severity/associated sxs/prior Treatment) HPI Comments: Patient here complaining of acute onset of shortness of breath after smoking a cigarette. Patient states that she became very anxious and EMS was called. Patient found to be a respirator distress and was given Solu-Medrol 125 IV push as well as epinephrine, 2 g of magnesium citrate as well as 15 on his albuterol. Patient has a history of COPD continues to smoke about a pack and half of cigarettes a day. Seen here week ago for similar symptoms and treated for COPD exacerbation. Denies any fever or chills. No vomiting or diarrhea. No recent travel history  Patient is a 50 y.o. female presenting with shortness of breath and cough. The history is provided by the patient.  Shortness of Breath Associated symptoms: cough   Cough Associated symptoms: shortness of breath     Past Medical History  Diagnosis Date  . COPD (chronic obstructive pulmonary disease) ([place])    History reviewed. No pertinent past surgical history. History reviewed. No pertinent family history. Social History  Substance Use Topics  . Smoking status: Current Every Day Smoker -- 1.00 packs/day  . Smokeless tobacco: None  . Alcohol Use: None   OB History    No data available     Review of Systems  Respiratory: Positive for cough and shortness of breath.   All other systems reviewed and are negative.     Allergies  Review of patient's allergies indicates no known allergies.  Home Medications   Prior to Admission medications   Medication Sig Start Date End Date Taking? Authorizing Provider  albuterol (PROVENTIL HFA;VENTOLIN HFA) 108 (90 BASE) MCG/ACT inhaler Inhale 2 puffs into the lungs every 6 (six) hours  as needed. For shortness of breath    Historical Provider, MD  albuterol (PROVENTIL HFA;VENTOLIN HFA) 108 (90 BASE) MCG/ACT inhaler Inhale 2 puffs into the lungs every 4 (four) hours as needed for wheezing (or cough). 11/26/11 40/81/44  Delora Fuel, MD  albuterol (PROVENTIL HFA;VENTOLIN HFA) 108 (90 Base) MCG/ACT inhaler Inhale 2 puffs into the lungs every 4 (four) hours as needed for wheezing or shortness of breath. 01/07/16   Shawn C Joy, PA-C  azithromycin (ZITHROMAX Z-PAK) 250 MG tablet Take 1 tablet (250 mg total) by mouth daily. 01/07/16   Shawn C Joy, PA-C  benzonatate (TESSALON) 100 MG capsule Take 1 capsule (100 mg total) by mouth every 8 (eight) hours. 01/07/16   Shawn C Joy, PA-C  budesonide-formoterol (SYMBICORT) 160-4.5 MCG/ACT inhaler Inhale 2 puffs into the lungs 2 (two) times daily.      Historical Provider, MD  budesonide-formoterol (SYMBICORT) 160-4.5 MCG/ACT inhaler Inhale 2 puffs into the lungs 2 (two) times daily. 01/07/16   Shawn C Joy, PA-C  guaiFENesin (MUCINEX) 600 MG 12 hr tablet Take 1 tablet (600 mg total) by mouth 2 (two) times daily. 01/07/16   Shawn C Joy, PA-C   BP 116/72 mmHg  Pulse 112  Resp 19  SpO2 86%  LMP 03/29/2011 Physical Exam  Constitutional: She is oriented to person, place, and time. She appears well-developed and well-nourished.  Non-toxic appearance. No distress.  HENT:  Head: Normocephalic and atraumatic.  Eyes: Conjunctivae, EOM and lids are normal. Pupils are equal, round, and reactive to light.  Neck: Normal range of motion. Neck supple. No tracheal deviation present. No thyroid mass present.  Cardiovascular: Normal rate, regular rhythm and normal heart sounds.  Exam reveals no gallop.   No murmur heard. Pulmonary/Chest: Effort normal. No stridor. No respiratory distress. She has decreased breath sounds. She has wheezes. She has no rhonchi. She has no rales.  Abdominal: Soft. Normal appearance and bowel sounds are normal. She exhibits no distension.  There is no tenderness. There is no rebound and no CVA tenderness.  Musculoskeletal: Normal range of motion. She exhibits no edema or tenderness.  Neurological: She is alert and oriented to person, place, and time. She has normal strength. No cranial nerve deficit or sensory deficit. GCS eye subscore is 4. GCS verbal subscore is 5. GCS motor subscore is 6.  Skin: Skin is warm and dry. No abrasion and no rash noted.  Psychiatric: She has a normal mood and affect. Her speech is normal and behavior is normal.  Nursing note and vitals reviewed.   ED Course  Procedures (including critical care time) Labs Review Labs Reviewed  BASIC METABOLIC PANEL - Abnormal; Notable for the following:    Glucose, Bld 146 (*)    All other components within normal limits  CBC - Abnormal; Notable for the following:    WBC 12.3 (*)    All other components within normal limits    Imaging Review Dg Chest Port 1 View  01/14/2016  CLINICAL DATA:  Acute onset of shortness of breath. Initial encounter. EXAM: PORTABLE CHEST 1 VIEW COMPARISON:  Chest radiograph performed 01/07/2016 FINDINGS: The lungs are well-aerated. Vascular congestion is noted. Peribronchial thickening is seen. A small right pleural effusion is noted. No pneumothorax is seen. The cardiomediastinal silhouette is normal in size. No acute osseous abnormalities are identified. IMPRESSION: Vascular congestion noted. Peribronchial thickening seen. Small right pleural effusion noted. Electronically Signed   By: Garald Balding M.D.   On: 01/14/2016 22:29   I have personally reviewed and evaluated these images and lab results as part of my medical decision-making.   EKG Interpretation None      MDM   Final diagnoses:  SOB (shortness of breath)    Patient had initially arrived on BiPAP when she arrived. Oxygenation had improved after treatment by EMS and patient was transitioned to a nonrebreather. She was given a 10 mg continuous albuterol  treatment. She was reassessed afterwards and pulse oximetry was 90% on 2 L but she desats to 85%. She was increased to 4 L nasal cannula. Will be admitted for COPD exacerbation.   CRITICAL CARE Performed by: Leota Jacobsen Total critical care time: 50 minutes Critical care time was exclusive of separately billable procedures and treating other patients. Critical care was necessary to treat or prevent imminent or life-threatening deterioration. Critical care was time spent personally by me on the following activities: development of treatment plan with patient and/or surrogate as well as nursing, discussions with consultants, evaluation of patient's response to treatment, examination of patient, obtaining history from patient or surrogate, ordering and performing treatments and interventions, ordering and review of laboratory studies, ordering and review of radiographic studies, pulse oximetry and re-evaluation of patient's condition.    Lacretia Leigh, MD 01/14/16 2351

## 2016-01-15 ENCOUNTER — Encounter (HOSPITAL_COMMUNITY): Payer: Self-pay | Admitting: Internal Medicine

## 2016-01-15 DIAGNOSIS — R0602 Shortness of breath: Secondary | ICD-10-CM

## 2016-01-15 DIAGNOSIS — J45909 Unspecified asthma, uncomplicated: Secondary | ICD-10-CM | POA: Insufficient documentation

## 2016-01-15 DIAGNOSIS — Z720 Tobacco use: Secondary | ICD-10-CM | POA: Diagnosis present

## 2016-01-15 DIAGNOSIS — J441 Chronic obstructive pulmonary disease with (acute) exacerbation: Principal | ICD-10-CM

## 2016-01-15 DIAGNOSIS — J9601 Acute respiratory failure with hypoxia: Secondary | ICD-10-CM | POA: Diagnosis present

## 2016-01-15 LAB — CBC
HCT: 39.1 % (ref 36.0–46.0)
HEMOGLOBIN: 12.7 g/dL (ref 12.0–15.0)
MCH: 31.1 pg (ref 26.0–34.0)
MCHC: 32.5 g/dL (ref 30.0–36.0)
MCV: 95.6 fL (ref 78.0–100.0)
PLATELETS: 273 10*3/uL (ref 150–400)
RBC: 4.09 MIL/uL (ref 3.87–5.11)
RDW: 13.5 % (ref 11.5–15.5)
WBC: 4.4 10*3/uL (ref 4.0–10.5)

## 2016-01-15 LAB — BASIC METABOLIC PANEL
Anion gap: 12 (ref 5–15)
BUN: 13 mg/dL (ref 6–20)
CHLORIDE: 105 mmol/L (ref 101–111)
CO2: 23 mmol/L (ref 22–32)
CREATININE: 0.8 mg/dL (ref 0.44–1.00)
Calcium: 9.3 mg/dL (ref 8.9–10.3)
GFR calc Af Amer: >60 mL/min (ref >60)
GFR calc non Af Amer: >60 mL/min (ref >60)
Glucose, Bld: 170 mg/dL — ABNORMAL HIGH (ref 65–99)
Potassium: 3.7 mmol/L (ref 3.5–5.1)
SODIUM: 140 mmol/L (ref 135–145)

## 2016-01-15 MED ORDER — HYDROMORPHONE HCL 1 MG/ML IJ SOLN: 0.5000 mg | INTRAMUSCULAR | Status: DC | PRN

## 2016-01-15 MED ORDER — SODIUM CHLORIDE 0.9% FLUSH: 3.0000 mL | INTRAVENOUS | Status: DC | PRN

## 2016-01-15 MED ORDER — METHYLPREDNISOLONE SODIUM SUCC 40 MG IJ SOLR
40.0000 mg | Freq: Four times a day (QID) | INTRAMUSCULAR | Status: DC
  Administered 2016-01-15 – 2016-01-16 (×4): 40 mg via INTRAVENOUS
  Filled 2016-01-15 (×8): qty 1

## 2016-01-15 MED ORDER — OXYCODONE HCL 5 MG PO TABS
5.0000 mg | ORAL_TABLET | ORAL | Status: DC | PRN
Start: 2016-01-15 — End: 2016-01-16

## 2016-01-15 MED ORDER — ALUM & MAG HYDROXIDE-SIMETH 200-200-20 MG/5ML PO SUSP: 30.0000 mL | Freq: Four times a day (QID) | ORAL | Status: DC | PRN

## 2016-01-15 MED ORDER — IPRATROPIUM-ALBUTEROL 0.5-2.5 (3) MG/3ML IN SOLN: 3.0000 mL | Freq: Three times a day (TID) | RESPIRATORY_TRACT | Status: DC

## 2016-01-15 MED ORDER — ALBUTEROL SULFATE (2.5 MG/3ML) 0.083% IN NEBU: 2.5000 mg | INHALATION_SOLUTION | Freq: Three times a day (TID) | RESPIRATORY_TRACT | Status: DC

## 2016-01-15 MED ORDER — SODIUM CHLORIDE 0.9% FLUSH
3.0000 mL | Freq: Two times a day (BID) | INTRAVENOUS | Status: DC
  Administered 2016-01-15: 3 mL via INTRAVENOUS

## 2016-01-15 MED ORDER — ACETAMINOPHEN 650 MG RE SUPP: 650.0000 mg | Freq: Four times a day (QID) | RECTAL | Status: DC | PRN

## 2016-01-15 MED ORDER — SODIUM CHLORIDE 0.9 % IV SOLN: 250.0000 mL | INTRAVENOUS | Status: DC | PRN

## 2016-01-15 MED ORDER — IPRATROPIUM-ALBUTEROL 0.5-2.5 (3) MG/3ML IN SOLN
3.0000 mL | Freq: Three times a day (TID) | RESPIRATORY_TRACT | Status: DC
  Administered 2016-01-15 – 2016-01-16 (×2): 3 mL via RESPIRATORY_TRACT
  Filled 2016-01-15 (×2): qty 3

## 2016-01-15 MED ORDER — SODIUM CHLORIDE 0.9% FLUSH
3.0000 mL | Freq: Two times a day (BID) | INTRAVENOUS | Status: DC
  Administered 2016-01-15 – 2016-01-16 (×3): 3 mL via INTRAVENOUS

## 2016-01-15 MED ORDER — PNEUMOCOCCAL VAC POLYVALENT 25 MCG/0.5ML IJ INJ
0.5000 mL | INJECTION | INTRAMUSCULAR | Status: AC
  Administered 2016-01-16: 0.5 mL via INTRAMUSCULAR
  Filled 2016-01-15 (×2): qty 0.5

## 2016-01-15 MED ORDER — ACETAMINOPHEN 325 MG PO TABS
650.0000 mg | ORAL_TABLET | Freq: Four times a day (QID) | ORAL | Status: DC | PRN
Start: 2016-01-15 — End: 2016-01-16
  Administered 2016-01-15 – 2016-01-16 (×3): 650 mg via ORAL
  Filled 2016-01-15 (×3): qty 2

## 2016-01-15 MED ORDER — ONDANSETRON HCL 4 MG/2ML IJ SOLN: 4.0000 mg | Freq: Four times a day (QID) | INTRAMUSCULAR | Status: DC | PRN

## 2016-01-15 MED ORDER — NICOTINE 21 MG/24HR TD PT24
21.0000 mg | MEDICATED_PATCH | Freq: Every day | TRANSDERMAL | Status: DC
Start: 2016-01-15 — End: 2016-01-16
  Administered 2016-01-15 – 2016-01-16 (×2): 21 mg via TRANSDERMAL
  Filled 2016-01-15 (×2): qty 1

## 2016-01-15 MED ORDER — ENOXAPARIN SODIUM 40 MG/0.4ML ~~LOC~~ SOLN
40.0000 mg | SUBCUTANEOUS | Status: DC
Start: 2016-01-15 — End: 2016-01-16
  Administered 2016-01-15 – 2016-01-16 (×2): 40 mg via SUBCUTANEOUS
  Filled 2016-01-15 (×2): qty 0.4

## 2016-01-15 MED ORDER — IPRATROPIUM-ALBUTEROL 0.5-2.5 (3) MG/3ML IN SOLN
3.0000 mL | RESPIRATORY_TRACT | Status: DC
  Administered 2016-01-15 (×3): 3 mL via RESPIRATORY_TRACT
  Filled 2016-01-15 (×3): qty 3

## 2016-01-15 MED ORDER — INFLUENZA VAC SPLIT QUAD 0.5 ML IM SUSY
0.5000 mL | PREFILLED_SYRINGE | INTRAMUSCULAR | Status: AC
  Administered 2016-01-16: 0.5 mL via INTRAMUSCULAR
  Filled 2016-01-15 (×2): qty 0.5

## 2016-01-15 MED ORDER — ONDANSETRON HCL 4 MG PO TABS: 4.0000 mg | ORAL_TABLET | Freq: Four times a day (QID) | ORAL | Status: DC | PRN

## 2016-01-15 MED ORDER — METHYLPREDNISOLONE SODIUM SUCC 125 MG IJ SOLR
125.0000 mg | Freq: Once | INTRAMUSCULAR | Status: AC
  Administered 2016-01-15: 125 mg via INTRAVENOUS
  Filled 2016-01-15: qty 2

## 2016-01-15 NOTE — Progress Notes (Signed)
Patient admitted after midnight-- please see H&P.  Plan to continue to smoke despite COPD exacerbation -wean O2 to RA -plan to change to PO abx in AM and d/c if able  Tanya Bear DO

## 2016-01-15 NOTE — H&P (Signed)
[facility] Admission History and Physical       Tanya Jordan YWV:[id number] DOB: Nov 17, 1966 DOA: 01/14/2016  Referring physician:  EDP PCP: No PCP Per Patient  Specialists:   Chief Complaint: Worsening SOB  HPI: Tanya Jordan is a 50 y.o. female with a history of COPD and Tobacco Abuse who presents to the ED with worsening SOB since the afternoon.  She reports smoking a cigarette and having increased SOB afterward with wheezing.   EMS was called and found her O2 sats at 85%, and she was placed on CPAP and administered IV Solumedrol, IV Magnesium, Albuterol Neb Rx, and Epinephrine on route to the ED.    She was weaned down to 4 liters NCO2 in the ED and was referred for admission.   Her O2 sats were 92% on 4 liters NCO2, and she reports mild improvement.       Review of Systems:  Constitutional: No Weight Loss, No Weight Gain, Night Sweats, Fevers, Chills, Dizziness, Light Headedness, Fatigue, or Generalized Weakness HEENT: No Headaches, Difficulty Swallowing,Tooth/Dental Problems,Sore Throat,  No Sneezing, Rhinitis, Ear Ache, Nasal Congestion, or Post Nasal Drip,  Cardio-vascular:  No Chest pain, Orthopnea, PND, Edema in Lower Extremities, Anasarca, Dizziness, Palpitations  Resp:  +Dyspnea, No DOE, No Productive Cough, No Non-Productive Cough, No Hemoptysis, +Wheezing.    GI: No Heartburn, Indigestion, Abdominal Pain, Nausea, Vomiting, Diarrhea, Constipation, Hematemesis, Hematochezia, Melena, Change in Bowel Habits,  Loss of Appetite  GU: No Dysuria, No Change in Color of Urine, No Urgency or Urinary Frequency, No Flank pain.  Musculoskeletal: No Joint Pain or Swelling, No Decreased Range of Motion, No Back Pain.  Neurologic: No Syncope, No Seizures, Muscle Weakness, Paresthesia, Vision Disturbance or Loss, No Diplopia, No Vertigo, No Difficulty Walking,  Skin: No Rash or Lesions. Psych: No Change in Mood or Affect, No Depression or Anxiety, No Memory loss, No Confusion, or  Hallucinations   Past Medical History  Diagnosis Date  . COPD (chronic obstructive pulmonary disease) ([place])      History reviewed. No pertinent past surgical history.    Prior to Admission medications   Medication Sig Start Date End Date Taking? Authorizing Provider  albuterol (PROVENTIL HFA;VENTOLIN HFA) 108 (90 BASE) MCG/ACT inhaler Inhale 2 puffs into the lungs every 4 (four) hours as needed for wheezing (or cough). 11/26/11 85/46/27  Delora Fuel, MD  albuterol (PROVENTIL HFA;VENTOLIN HFA) 108 (90 Base) MCG/ACT inhaler Inhale 2 puffs into the lungs every 4 (four) hours as needed for wheezing or shortness of breath. 01/07/16   Shawn C Joy, PA-C  azithromycin (ZITHROMAX Z-PAK) 250 MG tablet Take 1 tablet (250 mg total) by mouth daily. 01/07/16   Shawn C Joy, PA-C  benzonatate (TESSALON) 100 MG capsule Take 1 capsule (100 mg total) by mouth every 8 (eight) hours. 01/07/16   Shawn C Joy, PA-C  budesonide-formoterol (SYMBICORT) 160-4.5 MCG/ACT inhaler Inhale 2 puffs into the lungs 2 (two) times daily. 01/07/16   Shawn C Joy, PA-C  guaiFENesin (MUCINEX) 600 MG 12 hr tablet Take 1 tablet (600 mg total) by mouth 2 (two) times daily. 01/07/16   Shawn C Joy, PA-C     No Known Allergies  Social History:  reports that she has been smoking.  She does not have any smokeless tobacco history on file. Her alcohol and drug histories are not on file.    History reviewed. No pertinent family history.     Physical Exam:  GEN:  Pleasant Obese 50 y.o. Caucasian female  examined and in no acute distress; cooperative with exam Filed Vitals:   01/14/16 2257 01/14/16 2300 01/14/16 2339 01/15/16 0000  BP: 146/82 143/84 116/72 116/66  Pulse: 108 114 112 100  Resp: '20 19 19 25  '$ SpO2: 100% 91% 86% 95%   Blood pressure 116/66, pulse 100, resp. rate 25, last menstrual period 03/29/2011, SpO2 95 %. PSYCH: She is alert and oriented x4; does not appear anxious does not appear depressed; affect is normal HEENT:  Normocephalic and Atraumatic, Mucous membranes pink; PERRLA; EOM intact; Fundi:  Benign;  No scleral icterus, Nares: Patent, Oropharynx: Clear, Fair Dentition,    Neck:  FROM, No Cervical Lymphadenopathy nor Thyromegaly or Carotid Bruit; No JVD; Breasts:: Not examined CHEST WALL: No tenderness CHEST: Decreased Breath Sounds with occasional Wheezes  No Rales, No Rhonchi     HEART: Regular rate and rhythm; no murmurs rubs or gallops BACK: No kyphosis or scoliosis; No CVA tenderness ABDOMEN: Positive Bowel Sounds, Obese, Soft Non-Tender, No Rebound or Guarding; No Masses, No Organomegaly. Rectal Exam: Not done EXTREMITIES: No Cyanosis, Clubbing, or Edema; No Ulcerations. Genitalia: not examined PULSES: 2+ and symmetric SKIN: Normal hydration no rash or ulceration CNS:  Alert and Oriented x 4, No Focal Deficits Vascular: pulses palpable throughout    Labs on Admission:  Basic Metabolic Panel:  Recent Labs Lab 01/14/16 2232  NA 145  K 3.9  CL 108  CO2 24  GLUCOSE 146*  BUN 11  CREATININE 0.90  CALCIUM 9.5   Liver Function Tests: No results for input(s): AST, ALT, ALKPHOS, BILITOT, PROT, ALBUMIN in the last 168 hours. No results for input(s): LIPASE, AMYLASE in the last 168 hours. No results for input(s): AMMONIA in the last 168 hours. CBC:  Recent Labs Lab 01/14/16 2232  WBC 12.3*  HGB 13.7  HCT 41.3  MCV 94.1  PLT 258   Cardiac Enzymes: No results for input(s): CKTOTAL, CKMB, CKMBINDEX, TROPONINI in the last 168 hours.  BNP (last 3 results)  Recent Labs  01/07/16 1453  BNP 37.6    ProBNP (last 3 results) No results for input(s): PROBNP in the last 8760 hours.  CBG: No results for input(s): GLUCAP in the last 168 hours.  Radiological Exams on Admission: Dg Chest Port 1 View  01/14/2016  CLINICAL DATA:  Acute onset of shortness of breath. Initial encounter. EXAM: PORTABLE CHEST 1 VIEW COMPARISON:  Chest radiograph performed 01/07/2016 FINDINGS: The lungs  are well-aerated. Vascular congestion is noted. Peribronchial thickening is seen. A small right pleural effusion is noted. No pneumothorax is seen. The cardiomediastinal silhouette is normal in size. No acute osseous abnormalities are identified. IMPRESSION: Vascular congestion noted. Peribronchial thickening seen. Small right pleural effusion noted. Electronically Signed   By: Garald Balding M.D.   On: 01/14/2016 22:29      Assessment/Plan:      50 y.o. female with  Active Problems:    1.    COPD exacerbation ([place])    DuoNebs    IV Steroids then taper    NCO2 PRN      2.    Acute respiratory failure with hypoxia (H[facility])    Monitor O2 Sats    O2 PRN     3.   Tobacco abuse    Discussed and Counseled Patient     Patient is in Flat out Denial    4.   DVT Prophylaxis    Lovenox    Code Status:     FULL CODE  Family Communication:   Daughter at Bedside     Disposition Plan:    Inpatient Status    Time spent:  [facility] Pager [phone]   If [place] Please Contact the Day Rounding Team MD for [facility]  If 7PM-7AM, Please Contact Night-Floor Coverage  [email or web address] Password [email or web address] 01/15/2016, 12:23 AM     ADDENDUM:   Patient was seen and examined on 01/15/2016

## 2016-01-16 MED ORDER — PREDNISONE 10 MG PO TABS: ORAL_TABLET | ORAL | Status: DC

## 2016-01-16 MED ORDER — ALBUTEROL SULFATE (2.5 MG/3ML) 0.083% IN NEBU: 2.5000 mg | INHALATION_SOLUTION | Freq: Four times a day (QID) | RESPIRATORY_TRACT | Status: DC | PRN

## 2016-01-16 MED ORDER — PREDNISONE 50 MG PO TABS
60.0000 mg | ORAL_TABLET | Freq: Every day | ORAL | Status: DC
  Filled 2016-01-16: qty 1

## 2016-01-16 MED ORDER — IPRATROPIUM BROMIDE 0.02 % IN SOLN: 0.5000 mg | Freq: Four times a day (QID) | RESPIRATORY_TRACT | Status: DC

## 2016-01-16 MED FILL — predniSONE 10 MG TABS: 10 | 12 days supply | Qty: 42 | Fill #0

## 2016-01-16 MED FILL — ?ALBUTEROL SUL 2.5 MG/3 MLS: (2.5 MG/3ML | 30 days supply | Qty: 90 | Fill #0

## 2016-01-16 MED FILL — ?IPRATROPIUM BR 0.02% SOLN: 0.02 | 30 days supply | Qty: 188 | Fill #0

## 2016-01-16 NOTE — Care Management Note (Signed)
Case Management Note  Patient Details  Name: Tanya Jordan MRN: [id number] Date of Birth: 11-Dec-1965  Subjective/Objective:        50 yo admitted with COPD exacerbation            Action/Plan: From home alone  Expected Discharge Date:                  Expected Discharge Plan:  Home/Self Care  In-House Referral:     Discharge planning Services  CM Consult, [facility]  Post Acute Care Choice:    Choice offered to:     DME Arranged:    DME Agency:     HH Arranged:    [place] Agency:     Status of Service:  Completed, signed off  Medicare Important Message Given:    Date Medicare IM Given:    Medicare IM give by:    Date Additional Medicare IM Given:    Additional Medicare Important Message give by:     If discussed at [place] of Stay Meetings, dates discussed:    Additional Comments: This CM met with pt at bedside. Pt states she does not have a PCP and no insurance.  Pt states she does not have 02 at home and she uses a family member's nebulizer machine when she needs it.  This CM made pt a follow up appointment at the [facility] and placed on the AVS.  Pt given [facility] packet and services were explained to her. No other CM needs communicated.  Per RN, pt sats were 94% on room air with ambulation so will not need home 02. Lynnell Catalan, RN 01/16/2016, 1:34 PM

## 2016-01-16 NOTE — Progress Notes (Signed)
Tanya Jordan to be D/C'd Home per MD order.  Discussed prescriptions and follow up appointments with the patient. Prescriptions given to patient, medication list explained in detail. Pt verbalized understanding.    Medication List    STOP taking these medications        albuterol 108 (90 Base) MCG/ACT inhaler  Commonly known as:  PROVENTIL HFA;VENTOLIN HFA  Replaced by:  albuterol (2.5 MG/3ML) 0.083% nebulizer solution  You also have another medication with the same name that you need to continue taking as instructed.     azithromycin 250 MG tablet  Commonly known as:  ZITHROMAX Z-PAK      TAKE these medications        albuterol 108 (90 Base) MCG/ACT inhaler  Commonly known as:  PROVENTIL HFA;VENTOLIN HFA  Inhale 2 puffs into the lungs every 4 (four) hours as needed for wheezing or shortness of breath.     albuterol (2.5 MG/3ML) 0.083% nebulizer solution  Commonly known as:  PROVENTIL  Take 3 mLs (2.5 mg total) by nebulization every 6 (six) hours as needed for wheezing or shortness of breath.     benzonatate 100 MG capsule  Commonly known as:  TESSALON  Take 1 capsule (100 mg total) by mouth every 8 (eight) hours.     budesonide-formoterol 160-4.5 MCG/ACT inhaler  Commonly known as:  SYMBICORT  Inhale 2 puffs into the lungs 2 (two) times daily.     guaiFENesin 600 MG 12 hr tablet  Commonly known as:  MUCINEX  Take 1 tablet (600 mg total) by mouth 2 (two) times daily.     ipratropium 0.02 % nebulizer solution  Commonly known as:  ATROVENT  Take 2.5 mLs (0.5 mg total) by nebulization 4 (four) times daily.     predniSONE 10 MG tablet  Commonly known as:  DELTASONE  60 mg x 2 days, 50 mg x 2 days, 40 mg x 2 days, 30 mg x 2 days, 20 mg x 2 day, 81mg x 2 days then stop        Filed Vitals:   01/16/16 0224 01/16/16 0601  BP: 138/78 140/72  Pulse: 77 86  Temp: 97.5 F (36.4 C) 98 F (36.7 C)  Resp: 18 18    Skin clean, dry and intact without evidence of skin  break down, no evidence of skin tears noted. IV catheter discontinued intact. Site without signs and symptoms of complications. Dressing and pressure applied. Pt denies pain at this time. No complaints noted.  An After Visit Summary was printed and given to the patient. Patient escorted via [place] and D/C home via private auto.  DNonie HoyerS 01/16/2016 2:16 PM

## 2016-01-16 NOTE — Discharge Summary (Signed)
Physician Discharge Summary  Tanya Jordan SWF:[id number] DOB: Nov 17, 1966 DOA: 01/14/2016  PCP: No PCP Per Patient  Admit date: 01/14/2016 Discharge date: 01/16/2016  Time spent: 35 minutes  Recommendations for Outpatient Follow-up:  1. Stop smoking   Discharge Diagnoses:  Active Problems:   COPD exacerbation (HCC)   Tobacco abuse   Acute respiratory failure with hypoxia ([place])   Discharge Condition: improved  Diet recommendation: cardiac  Filed Weights   01/15/16 1400  Weight: 59.875 kg (132 lb)    History of present illness:  Tanya Jordan is a 50 y.o. female with a history of COPD and Tobacco Abuse who presents to the ED with worsening SOB since the afternoon. She reports smoking a cigarette and having increased SOB afterward with wheezing. EMS was called and found her O2 sats at 85%, and she was placed on CPAP and administered IV Solumedrol, IV Magnesium, Albuterol Neb Rx, and Epinephrine on route to the ED. She was weaned down to 4 liters NCO2 in the ED and was referred for admission. Her O2 sats were 92% on 4 liters NCO2, and she reports mild improvement.  Hospital Course:  COPD exacerbation due to tobacco abuse -wean steroids -nebs- $4 from [employer] -stop smoking -not requiring O2, feeling better  Procedures:    Consultations:    Discharge Exam: Filed Vitals:   01/16/16 0224 01/16/16 0601  BP: 138/78 140/72  Pulse: 77 86  Temp: 97.5 F (36.4 C) 98 F (36.7 C)  Resp: 18 18    General: awake, walking around room Cardiovascular: rrr Respiratory: no wheeezing  Discharge Instructions   Discharge Instructions    Diet - low sodium heart healthy    Complete by:  As directed      Discharge instructions    Complete by:  As directed   Stop smoking     Increase activity slowly    Complete by:  As directed           Current Discharge Medication List    START taking these medications   Details  albuterol (PROVENTIL) (2.5 MG/3ML) 0.083%  nebulizer solution Take 3 mLs (2.5 mg total) by nebulization every 6 (six) hours as needed for wheezing or shortness of breath. Qty: 75 mL, Refills: 12    ipratropium (ATROVENT) 0.02 % nebulizer solution Take 2.5 mLs (0.5 mg total) by nebulization 4 (four) times daily. Qty: 75 mL, Refills: 12    predniSONE (DELTASONE) 10 MG tablet 60 mg x 2 days, 50 mg x 2 days, 40 mg x 2 days, 30 mg x 2 days, 20 mg x 2 day, 104mg x 2 days then stop Qty: 42 tablet, Refills: 0      CONTINUE these medications which have NOT CHANGED   Details  albuterol (PROVENTIL HFA;VENTOLIN HFA) 108 (90 Base) MCG/ACT inhaler Inhale 2 puffs into the lungs every 4 (four) hours as needed for wheezing or shortness of breath. Qty: 1 Inhaler, Refills: 4    benzonatate (TESSALON) 100 MG capsule Take 1 capsule (100 mg total) by mouth every 8 (eight) hours. Qty: 21 capsule, Refills: 0    budesonide-formoterol (SYMBICORT) 160-4.5 MCG/ACT inhaler Inhale 2 puffs into the lungs 2 (two) times daily. Qty: 1 Inhaler, Refills: 12    guaiFENesin (MUCINEX) 600 MG 12 hr tablet Take 1 tablet (600 mg total) by mouth 2 (two) times daily. Qty: 30 tablet, Refills: 0      STOP taking these medications     azithromycin (ZITHROMAX Z-PAK) 250 MG tablet  No Known Allergies    The results of significant diagnostics from this hospitalization (including imaging, microbiology, ancillary and laboratory) are listed below for reference.    Significant Diagnostic Studies: Dg Chest 2 View  01/07/2016  CLINICAL DATA:  Shortness of breath, cough, and lower back pain for 3 days. EXAM: CHEST  2 VIEW COMPARISON:  11/26/2011 FINDINGS: The cardiomediastinal silhouette is within normal limits. The lungs are hyperinflated. Minimal right lower lung opacity described on the prior study has resolved. There is no evidence of airspace consolidation, edema, pleural effusion, or pneumothorax. Slight peribronchial thickening is chronic. No acute osseous  abnormality is identified. IMPRESSION: No active cardiopulmonary disease. Electronically Signed   By: Logan Bores M.D.   On: 01/07/2016 15:58   Dg Chest Port 1 View  01/14/2016  CLINICAL DATA:  Acute onset of shortness of breath. Initial encounter. EXAM: PORTABLE CHEST 1 VIEW COMPARISON:  Chest radiograph performed 01/07/2016 FINDINGS: The lungs are well-aerated. Vascular congestion is noted. Peribronchial thickening is seen. A small right pleural effusion is noted. No pneumothorax is seen. The cardiomediastinal silhouette is normal in size. No acute osseous abnormalities are identified. IMPRESSION: Vascular congestion noted. Peribronchial thickening seen. Small right pleural effusion noted. Electronically Signed   By: Garald Balding M.D.   On: 01/14/2016 22:29    Microbiology: No results found for this or any previous visit (from the past 240 hour(s)).   Labs: Basic Metabolic Panel:  Recent Labs Lab 01/14/16 2232 01/15/16 0610  NA 145 140  K 3.9 3.7  CL 108 105  CO2 24 23  GLUCOSE 146* 170*  BUN 11 13  CREATININE 0.90 0.80  CALCIUM 9.5 9.3   Liver Function Tests: No results for input(s): AST, ALT, ALKPHOS, BILITOT, PROT, ALBUMIN in the last 168 hours. No results for input(s): LIPASE, AMYLASE in the last 168 hours. No results for input(s): AMMONIA in the last 168 hours. CBC:  Recent Labs Lab 01/14/16 2232 01/15/16 0610  WBC 12.3* 4.4  HGB 13.7 12.7  HCT 41.3 39.1  MCV 94.1 95.6  PLT 258 273   Cardiac Enzymes: No results for input(s): CKTOTAL, CKMB, CKMBINDEX, TROPONINI in the last 168 hours. BNP: BNP (last 3 results)  Recent Labs  01/07/16 1453  BNP 37.6    ProBNP (last 3 results) No results for input(s): PROBNP in the last 8760 hours.  CBG: No results for input(s): GLUCAP in the last 168 hours.     Signed:  Geradine Girt DO.  [facility] 01/16/2016, 9:43 AM

## 2016-01-18 ENCOUNTER — Encounter: Payer: Self-pay | Admitting: Critical Care Medicine

## 2016-01-18 ENCOUNTER — Ambulatory Visit: Payer: Self-pay | Attending: Critical Care Medicine | Admitting: Critical Care Medicine

## 2016-01-18 VITALS — BP 154/85 | HR 85 | Temp 98.3°F | Resp 18 | Ht 64.0 in | Wt 135.0 lb

## 2016-01-18 DIAGNOSIS — J440 Chronic obstructive pulmonary disease with acute lower respiratory infection: Secondary | ICD-10-CM | POA: Insufficient documentation

## 2016-01-18 DIAGNOSIS — J45909 Unspecified asthma, uncomplicated: Secondary | ICD-10-CM | POA: Insufficient documentation

## 2016-01-18 DIAGNOSIS — F17210 Nicotine dependence, cigarettes, uncomplicated: Secondary | ICD-10-CM | POA: Insufficient documentation

## 2016-01-18 DIAGNOSIS — J449 Chronic obstructive pulmonary disease, unspecified: Secondary | ICD-10-CM

## 2016-01-18 DIAGNOSIS — J441 Chronic obstructive pulmonary disease with (acute) exacerbation: Secondary | ICD-10-CM

## 2016-01-18 DIAGNOSIS — J4489 Other specified chronic obstructive pulmonary disease: Secondary | ICD-10-CM

## 2016-01-18 DIAGNOSIS — J9601 Acute respiratory failure with hypoxia: Secondary | ICD-10-CM

## 2016-01-18 DIAGNOSIS — F17200 Nicotine dependence, unspecified, uncomplicated: Secondary | ICD-10-CM

## 2016-01-18 DIAGNOSIS — Z79899 Other long term (current) drug therapy: Secondary | ICD-10-CM | POA: Insufficient documentation

## 2016-01-18 DIAGNOSIS — Z720 Tobacco use: Secondary | ICD-10-CM

## 2016-01-18 DIAGNOSIS — J209 Acute bronchitis, unspecified: Secondary | ICD-10-CM

## 2016-01-18 MED ORDER — ALBUTEROL SULFATE HFA 108 (90 BASE) MCG/ACT IN AERS: 2.0000 | INHALATION_SPRAY | RESPIRATORY_TRACT | Status: DC | PRN

## 2016-01-18 MED ORDER — NICOTINE 21 MG/24HR TD PT24: 21.0000 mg | MEDICATED_PATCH | Freq: Every day | TRANSDERMAL | Status: DC

## 2016-01-18 MED FILL — VENTOLIN HFA 90 MCG INHALER: 108 (90 BAS | 30 days supply | Qty: 18 | Fill #0

## 2016-01-18 NOTE — Assessment & Plan Note (Signed)
COPD with asthmatic bronchitic component status post exacerbation now resolved Plan Continue Symbicort 2 puffs twice daily and nebulized therapy as needed Focus on smoking cessation with nicotine replacement therapy 5 minutes of smoking cessation counseling issued

## 2016-01-18 NOTE — Assessment & Plan Note (Signed)
Acute hypoxic respiratory failure now improved Note ambulatory saturations are normal No indication for supplemental oxygen at this time

## 2016-01-18 NOTE — Assessment & Plan Note (Signed)
Smoking cessation counseling given to the patient Patient will begin nicotine replacement therapy with nicotine patch

## 2016-01-18 NOTE — Progress Notes (Signed)
Patient is here for HFU for COPD  Patient denies pain, SOB, wheezing and swelling at this time.  Patient just filled all medications.

## 2016-01-18 NOTE — Patient Instructions (Signed)
Focus on smoking cessation, use nicoderm patch Stay on symbicort two puff twice daily Use nebulizer as needed  A primary care MD will be obtained Return 2 months

## 2016-01-18 NOTE — Progress Notes (Signed)
Subjective:    Patient ID: Tanya Jordan, female    DOB: 04-20-1966, 50 y.o.   MRN: [id number]  HPI Comments: Hosp f/u for copd [facility]11 to 2/13 [facility]. [facility] rx steroids now better.    Shortness of Breath This is a chronic (hx for several years of copd ) problem. The current episode started more than 1 year ago. The problem occurs constantly. The problem has been rapidly improving. Associated symptoms include orthopnea, sputum production and wheezing. Pertinent negatives include no abdominal pain, chest pain, claudication, coryza, ear pain, fever, headaches, hemoptysis, leg pain, leg swelling, neck pain, PND, rash, rhinorrhea, sore throat, swollen glands, syncope or vomiting. The symptoms are aggravated by smoke, fumes, lying flat, exercise, any activity, weather changes and pollens. Risk factors include smoking. She has tried beta agonist inhalers and oral steroids for the symptoms. The treatment provided significant relief. Her past medical history is significant for asthma and COPD. There is no history of allergies, CAD, chronic lung disease, DVT, a heart failure, PE, pneumonia or a recent surgery.   Past Medical History  Diagnosis Date  . COPD (chronic obstructive pulmonary disease) (HCC)      Family History  Problem Relation Age of Onset  . Lung cancer Father   . Hypertension Mother      Social History   Social History  . Marital Status: Divorced    Spouse Name: N/A  . Number of Children: N/A  . Years of Education: N/A   Occupational History  . cashier    Social History Main Topics  . Smoking status: Current Every Day Smoker -- 1.00 packs/day  . Smokeless tobacco: Not on file  . Alcohol Use: Not on file  . Drug Use: Not on file  . Sexual Activity: Not on file   Other Topics Concern  . Not on file   Social History Narrative     No Known Allergies   Outpatient Prescriptions Prior to Visit  Medication Sig Dispense Refill  . albuterol (PROVENTIL) (2.5 MG/3ML)  0.083% nebulizer solution Take 3 mLs (2.5 mg total) by nebulization every 6 (six) hours as needed for wheezing or shortness of breath. 75 mL 12  . benzonatate (TESSALON) 100 MG capsule Take 1 capsule (100 mg total) by mouth every 8 (eight) hours. 21 capsule 0  . budesonide-formoterol (SYMBICORT) 160-4.5 MCG/ACT inhaler Inhale 2 puffs into the lungs 2 (two) times daily. 1 Inhaler 12  . guaiFENesin (MUCINEX) 600 MG 12 hr tablet Take 1 tablet (600 mg total) by mouth 2 (two) times daily. 30 tablet 0  . ipratropium (ATROVENT) 0.02 % nebulizer solution Take 2.5 mLs (0.5 mg total) by nebulization 4 (four) times daily. 75 mL 12  . predniSONE (DELTASONE) 10 MG tablet 60 mg x 2 days, 50 mg x 2 days, 40 mg x 2 days, 30 mg x 2 days, 20 mg x 2 day, 57mg x 2 days then stop 42 tablet 0  . albuterol (PROVENTIL HFA;VENTOLIN HFA) 108 (90 Base) MCG/ACT inhaler Inhale 2 puffs into the lungs every 4 (four) hours as needed for wheezing or shortness of breath. 1 Inhaler 4   No facility-administered medications prior to visit.       Review of Systems  Constitutional: Negative for fever, appetite change and fatigue.  HENT: Negative for ear discharge, ear pain, nosebleeds, postnasal drip, rhinorrhea, sore throat and trouble swallowing.   Respiratory: Positive for sputum production, shortness of breath and wheezing. Negative for hemoptysis.   Cardiovascular:  Positive for orthopnea. Negative for chest pain, claudication, leg swelling, syncope and PND.  Gastrointestinal: Negative for vomiting and abdominal pain.       No chronic heartburn  Musculoskeletal: Negative for neck pain.  Skin: Negative for rash.  Neurological: Negative for headaches.       Objective:   Physical Exam  Filed Vitals:   01/18/16 1635  BP: 154/85  Pulse: 85  Temp: 98.3 F (36.8 C)  TempSrc: Oral  Resp: 18  Height: '5\' 4"'$  (1.626 m)  Weight: 135 lb (61.236 kg)  SpO2: 100%    Gen: Pleasant, well-nourished, in no distress,  normal  affect  ENT: No lesions,  mouth clear,  oropharynx clear, no postnasal drip  Neck: No JVD, no TMG, no carotid bruits  Lungs: No use of accessory muscles, no dullness to percussion, clear without rales or rhonchi  Cardiovascular: RRR, heart sounds normal, no murmur or gallops, no peripheral edema  Abdomen: soft and NT, no HSM,  BS normal  Musculoskeletal: No deformities, no cyanosis or clubbing  Neuro: alert, non focal  Skin: Warm, no lesions or rashes  No results found.   amb sats normal on RA 96%     Assessment & Plan:  I personally reviewed all images and lab data in the [facility] system as well as any outside material available during this office visit and agree with the  radiology impressions.   Acute respiratory failure with hypoxia (HCC) Acute hypoxic respiratory failure now improved Note ambulatory saturations are normal No indication for supplemental oxygen at this time  COPD exacerbation (HCC) COPD with asthmatic bronchitic component status post exacerbation now resolved Plan Continue Symbicort 2 puffs twice daily and nebulized therapy as needed Focus on smoking cessation with nicotine replacement therapy 5 minutes of smoking cessation counseling issued   Tobacco abuse Smoking cessation counseling given to the patient Patient will begin nicotine replacement therapy with nicotine patch    Shelia was seen today for hospitalization follow-up.  Diagnoses and all orders for this visit:  COPD with chronic bronchitis ([place])  Acute bronchitis, unspecified organism  Tobacco use disorder  Acute respiratory failure with hypoxia (HCC)  COPD exacerbation (HCC)  Tobacco abuse  Other orders -     albuterol (PROVENTIL HFA;VENTOLIN HFA) 108 (90 Base) MCG/ACT inhaler; Inhale 2 puffs into the lungs every 4 (four) hours as needed for wheezing or shortness of breath. -     nicotine (NICODERM CQ) 21 mg/24hr patch; Place 1 patch (21 mg total) onto the skin  daily.

## 2016-02-16 MED FILL — VENTOLIN HFA 90 MCG INHALER: 108 (90 BAS | 30 days supply | Qty: 18 | Fill #1

## 2016-03-02 ENCOUNTER — Ambulatory Visit: Payer: PRIVATE HEALTH INSURANCE | Admitting: Family Medicine

## 2016-03-12 MED FILL — SYMBICORT 160-4.5 MCG INH: 160-4.5 | 30 days supply | Qty: 10 | Fill #0

## 2016-03-12 MED FILL — !VENTOLIN HFA INHALER: 108 (90 BAS | 30 days supply | Qty: 18 | Fill #2

## 2016-03-12 MED FILL — ALBUTEROL 0.083% INHAL SOLN: (2.5 MG/3ML | 30 days supply | Qty: 90 | Fill #1

## 2016-03-21 ENCOUNTER — Ambulatory Visit: Payer: Self-pay | Attending: Critical Care Medicine | Admitting: Critical Care Medicine

## 2016-03-21 ENCOUNTER — Encounter: Payer: Self-pay | Admitting: Critical Care Medicine

## 2016-03-21 VITALS — BP 138/82 | HR 77 | Temp 98.0°F | Resp 18 | Ht 64.0 in | Wt 136.0 lb

## 2016-03-21 DIAGNOSIS — J4489 Other specified chronic obstructive pulmonary disease: Secondary | ICD-10-CM

## 2016-03-21 DIAGNOSIS — J449 Chronic obstructive pulmonary disease, unspecified: Secondary | ICD-10-CM

## 2016-03-21 DIAGNOSIS — F17210 Nicotine dependence, cigarettes, uncomplicated: Secondary | ICD-10-CM | POA: Insufficient documentation

## 2016-03-21 DIAGNOSIS — J45901 Unspecified asthma with (acute) exacerbation: Secondary | ICD-10-CM | POA: Insufficient documentation

## 2016-03-21 DIAGNOSIS — Z0001 Encounter for general adult medical examination with abnormal findings: Secondary | ICD-10-CM | POA: Insufficient documentation

## 2016-03-21 DIAGNOSIS — J441 Chronic obstructive pulmonary disease with (acute) exacerbation: Secondary | ICD-10-CM

## 2016-03-21 MED ORDER — IPRATROPIUM BROMIDE 0.02 % IN SOLN: 0.5000 mg | Freq: Two times a day (BID) | RESPIRATORY_TRACT | Status: DC

## 2016-03-21 NOTE — Patient Instructions (Signed)
Use symbicort two puff twice daily USe atrovent ipratroprium in nebulizer twice daily Use albuterol as needed nebulizer or inhaler 4 times daily as needed Focus on reducing tobacco intake Return 4 months

## 2016-03-21 NOTE — Progress Notes (Signed)
Patient is here for 2 month FU  Patient denies pain at this time.  Patient complains of allergies.  Patient has not taken any medication nor has the patient eaten today.

## 2016-03-21 NOTE — Progress Notes (Signed)
   Subjective:    Patient ID: Tanya Jordan, female    DOB: 07-28-1966, 50 y.o.   MRN: [id number]  HPI Comments: Hosp f/u for copd [facility] to 2/13 [facility]. [facility] rx steroids now better.    03/21/2016 Chief Complaint  Patient presents with  . Follow-up    2 month  F/u copd   Pt on symbicort and was to focus on smoking cessation. Pt is down to < 10cig per day.  Pt with some cough past few days, nose is stuffy.  Notes mild wheeze.  Dyspnea is at baseline.  Pt now at work and doing well Pt denies any significant sore throat, nasal congestion or excess secretions, fever, chills, sweats, unintended weight loss, pleurtic or exertional chest pain, orthopnea PND, or leg swelling Pt denies any increase in rescue therapy over baseline, denies waking up needing it or having any early am or nocturnal exacerbations of coughing/wheezing/or dyspnea. Pt also denies any obvious fluctuation in symptoms with  weather or environmental change or other alleviating or aggravating factors      Review of Systems  Constitutional: Negative for appetite change and fatigue.  HENT: Negative for ear discharge, nosebleeds, postnasal drip and trouble swallowing.   Respiratory: Positive for cough and shortness of breath.   Gastrointestinal:       No chronic heartburn       Objective:   Physical Exam   Filed Vitals:   03/21/16 1201  BP: 138/82  Pulse: 77  Temp: 98 F (36.7 C)  TempSrc: Oral  Resp: 18  Height: '5\' 4"'$  (1.626 m)  Weight: 136 lb (61.689 kg)  SpO2: 98%    Gen: Pleasant, well-nourished, in no distress,  normal affect  ENT: No lesions,  mouth clear,  oropharynx clear, no postnasal drip  Neck: No JVD, no TMG, no carotid bruits  Lungs: No use of accessory muscles, no dullness to percussion, distant BS  Cardiovascular: RRR, heart sounds normal, no murmur or gallops, no peripheral edema  Abdomen: soft and NT, no HSM,  BS normal  Musculoskeletal: No deformities, no cyanosis or  clubbing  Neuro: alert, non focal  Skin: Warm, no lesions or rashes  No results found.        Assessment & Plan:  I personally reviewed all images and lab data in the [facility] system as well as any outside material available during this office visit and agree with the  radiology impressions.   COPD exacerbation (HCC) Recent copd exacerbation Resolved Tobacco use Plan Use symbicort two puff twice daily USe atrovent ipratroprium in nebulizer twice daily Use albuterol as needed nebulizer or inhaler 4 times daily as needed Focus on reducing tobacco intake Return 4 months   COPD with chronic bronchitis (HCC) Use symbicort two puff twice daily USe atrovent ipratroprium in nebulizer twice daily Use albuterol as needed nebulizer or inhaler 4 times daily as needed Focus on reducing tobacco intake Return 4 months     Shelia was seen today for follow-up.  Diagnoses and all orders for this visit:  COPD with chronic bronchitis ([place])  COPD exacerbation ([place])  Other orders -     ipratropium (ATROVENT) 0.02 % nebulizer solution; Take 2.5 mLs (0.5 mg total) by nebulization 2 (two) times daily.

## 2016-03-22 DIAGNOSIS — J449 Chronic obstructive pulmonary disease, unspecified: Secondary | ICD-10-CM | POA: Insufficient documentation

## 2016-03-22 NOTE — Assessment & Plan Note (Signed)
Recent copd exacerbation Resolved Tobacco use Plan Use symbicort two puff twice daily USe atrovent ipratroprium in nebulizer twice daily Use albuterol as needed nebulizer or inhaler 4 times daily as needed Focus on reducing tobacco intake Return 4 months

## 2016-03-22 NOTE — Assessment & Plan Note (Signed)
Use symbicort two puff twice daily USe atrovent ipratroprium in nebulizer twice daily Use albuterol as needed nebulizer or inhaler 4 times daily as needed Focus on reducing tobacco intake Return 4 months

## 2016-03-26 MED FILL — ?IPRATROPIUM BR 0.02% SOLN: 0.02 | 30 days supply | Qty: 188 | Fill #1

## 2016-04-11 MED FILL — VENTOLIN HFA 90 MCG INHALER: 108 (90 BAS | 30 days supply | Qty: 18 | Fill #3

## 2016-04-16 ENCOUNTER — Other Ambulatory Visit: Payer: Self-pay | Admitting: Internal Medicine

## 2016-04-16 MED ORDER — BUDESONIDE-FORMOTEROL FUMARATE 160-4.5 MCG/ACT IN AERO: 2.0000 | INHALATION_SPRAY | Freq: Two times a day (BID) | RESPIRATORY_TRACT | Status: DC

## 2016-04-16 MED ORDER — ALBUTEROL SULFATE HFA 108 (90 BASE) MCG/ACT IN AERS: 2.0000 | INHALATION_SPRAY | RESPIRATORY_TRACT | Status: DC | PRN

## 2016-04-17 ENCOUNTER — Telehealth: Payer: Self-pay | Admitting: General Practice

## 2016-04-17 MED FILL — ALBUTEROL 0.083% INHAL SOLN: (2.5 MG/3ML | 8 days supply | Qty: 90 | Fill #2

## 2016-04-17 NOTE — Telephone Encounter (Signed)
Pt. Came into facility stating that she would like a stronger medication b/c the ipratropium (ATROVENT) 0.02 % nebulizer solution is not helping her. Pt. Stated she sometimes it is hard for her to breathe.Please f/u with pt.

## 2016-05-11 MED FILL — VENTOLIN HFA 90 MCG INHALER: 108 (90 BAS | 30 days supply | Qty: 18 | Fill #4

## 2016-05-11 MED FILL — ?IPRATROPIUM BR 0.02% SOLN: 0.02 | 30 days supply | Qty: 188 | Fill #2

## 2016-05-11 MED FILL — ALBUTEROL 0.083% INHAL SOLN: (2.5 MG/3ML | 8 days supply | Qty: 90 | Fill #3

## 2016-06-08 MED FILL — ?IPRATROPIUM BR 0.02% SOLN: 0.02 | 30 days supply | Qty: 188 | Fill #3

## 2016-06-08 MED FILL — !VENTOLIN HFA INHALER: 108 (90 BAS | 30 days supply | Qty: 18 | Fill #0

## 2016-06-18 MED FILL — ALBUTEROL 0.083% INHAL SOLN: (2.5 MG/3ML | 8 days supply | Qty: 90 | Fill #4

## 2016-06-18 MED FILL — SYMBICORT 160-4.5 MCG INH: 160-4.5 | 30 days supply | Qty: 10 | Fill #1

## 2016-06-27 NOTE — Telephone Encounter (Signed)
Dr. Joya Gaskins is there an alternative for this patient? Patient was only seen by Dr. Joya Gaskins. I am unable to route messages to him do to them being rerouted throughout [facility].

## 2016-07-09 ENCOUNTER — Other Ambulatory Visit: Payer: Self-pay | Admitting: Critical Care Medicine

## 2016-07-09 MED FILL — VENTOLIN HFA 90 MCG INHALER: 108 (90 BAS | 30 days supply | Qty: 18 | Fill #1

## 2016-07-10 MED FILL — IPRATROPIUM BR 0.02% SOLN: 0.02 | 30 days supply | Qty: 188 | Fill #4

## 2016-07-18 ENCOUNTER — Ambulatory Visit: Payer: Self-pay | Attending: Critical Care Medicine | Admitting: Critical Care Medicine

## 2016-07-18 ENCOUNTER — Encounter: Payer: Self-pay | Admitting: Critical Care Medicine

## 2016-07-18 VITALS — BP 145/83 | HR 72 | Temp 98.6°F | Resp 20 | Ht 64.0 in | Wt 133.0 lb

## 2016-07-18 DIAGNOSIS — Z720 Tobacco use: Secondary | ICD-10-CM

## 2016-07-18 DIAGNOSIS — J4489 Other specified chronic obstructive pulmonary disease: Secondary | ICD-10-CM

## 2016-07-18 DIAGNOSIS — J449 Chronic obstructive pulmonary disease, unspecified: Secondary | ICD-10-CM | POA: Insufficient documentation

## 2016-07-18 DIAGNOSIS — F17210 Nicotine dependence, cigarettes, uncomplicated: Secondary | ICD-10-CM | POA: Insufficient documentation

## 2016-07-18 MED ORDER — IPRATROPIUM BROMIDE 0.02 % IN SOLN: 0.5000 mg | Freq: Four times a day (QID) | RESPIRATORY_TRACT | 12 refills | Status: DC

## 2016-07-18 MED ORDER — BUDESONIDE-FORMOTEROL FUMARATE 160-4.5 MCG/ACT IN AERO: 2.0000 | INHALATION_SPRAY | Freq: Two times a day (BID) | RESPIRATORY_TRACT | 4 refills | Status: DC

## 2016-07-18 NOTE — Assessment & Plan Note (Signed)
Copd with chronic bronchitis Still smoking Plan  Cont symbicort and atrovent scheduled Prn albuterol  Focus on smoking cessation

## 2016-07-18 NOTE — Progress Notes (Signed)
Patient is here for FU COPD  Patient denies pain at this time.  Patient has taken OTC cold and FLU. Patient has not eaten today.

## 2016-07-18 NOTE — Progress Notes (Signed)
Subjective:    Patient ID: Tanya Jordan, female    DOB: [birth year], 50 y.o.   MRN: [id number]  HPI 03/21/2016 Chief Complaint  Patient presents with  . Follow-up    2 month  F/u copd   Pt on symbicort and was to focus on smoking cessation. Pt is down to < 10cig per day.  Pt with some cough past few days, nose is stuffy.  Notes mild wheeze.  Dyspnea is at baseline.  Pt now at work and doing well Pt denies any significant sore throat, nasal congestion or excess secretions, fever, chills, sweats, unintended weight loss, pleurtic or exertional chest pain, orthopnea PND, or leg swelling Pt denies any increase in rescue therapy over baseline, denies waking up needing it or having any early am or nocturnal exacerbations of coughing/wheezing/or dyspnea. Pt also denies any obvious fluctuation in symptoms with  weather or environmental change or other alleviating or aggravating factors  07/18/2016 Now off pred tessalon mucinex. Overall is doing better since exposed to animals at daughters.  Pt is still on atrovent nebulizer.  The rescue inhaler not as effective Pt on symbicort and helps.  Now not much cough.  Not much wheezing recently Now down to 5 cigs per day.   Pt denies any significant sore throat, nasal congestion or excess secretions, fever, chills, sweats, unintended weight loss, pleurtic or exertional chest pain, orthopnea PND, or leg swelling Pt denies any increase in rescue therapy over baseline, denies waking up needing it or having any early am or nocturnal exacerbations of coughing/wheezing/or dyspnea. Pt also denies any obvious fluctuation in symptoms with  weather or environmental change or other alleviating or aggravating factors  Has access to nicoderm patch     Review of Systems  Constitutional: Negative for appetite change and fatigue.  HENT: Negative for ear discharge, nosebleeds, postnasal drip and trouble swallowing.   Respiratory: Positive for shortness of breath and  wheezing. Negative for cough and chest tightness.   Gastrointestinal:       No chronic heartburn       Objective:   Physical Exam  Vitals:   07/18/16 1013  BP: (!) 145/83  Pulse: 72  Resp: 20  Temp: 98.6 F (37 C)  TempSrc: Oral  SpO2: 97%  Weight: 133 lb (60.3 kg)  Height: '5\' 4"'$  (1.626 m)    Gen: Pleasant, well-nourished, in no distress,  normal affect  ENT: No lesions,  mouth clear,  oropharynx clear, no postnasal drip  Neck: No JVD, no TMG, no carotid bruits  Lungs: No use of accessory muscles, no dullness to percussion, distant BS  Cardiovascular: RRR, heart sounds normal, no murmur or gallops, no peripheral edema  Abdomen: soft and NT, no HSM,  BS normal  Musculoskeletal: No deformities, no cyanosis or clubbing  Neuro: alert, non focal  Skin: Warm, no lesions or rashes  No results found.        Assessment & Plan:  I personally reviewed all images and lab data in the [facility] system as well as any outside material available during this office visit and agree with the  radiology impressions.   COPD with chronic bronchitis ([place]) Copd with chronic bronchitis Still smoking Plan  Cont symbicort and atrovent scheduled Prn albuterol  Focus on smoking cessation    Tobacco abuse 5 min smoking cessation counseling given   Tanya Jordan was seen today for copd.  Diagnoses and all orders for this visit:  COPD with chronic bronchitis ([place])  Tobacco abuse  Other orders -     ipratropium (ATROVENT) 0.02 % nebulizer solution; Take 2.5 mLs (0.5 mg total) by nebulization 4 (four) times daily. -     budesonide-formoterol (SYMBICORT) 160-4.5 MCG/ACT inhaler; Inhale 2 puffs into the lungs 2 (two) times daily.

## 2016-07-18 NOTE — Assessment & Plan Note (Signed)
5 min smoking cessation counseling given

## 2016-07-18 NOTE — Patient Instructions (Signed)
Stay on symbicort and atrovent as before on schedule  Work on smoking cessation Return 4 months

## 2016-08-02 MED FILL — IPRATROPIUM BR 0.02% SOLN: 0.02 | 30 days supply | Qty: 188 | Fill #5

## 2016-08-02 MED FILL — VENTOLIN HFA 90 MCG INHALER: 108 (90 BAS | 30 days supply | Qty: 18 | Fill #2

## 2016-08-02 MED FILL — SYMBICORT 160-4.5 MCG INH: 160-4.5 | 30 days supply | Qty: 10 | Fill #2

## 2016-08-28 MED FILL — IPRATROPIUM BR 0.02% SOLN: 0.02 | 30 days supply | Qty: 188 | Fill #6

## 2016-08-28 MED FILL — VENTOLIN HFA 90 MCG INHALER: 108 (90 BAS | 30 days supply | Qty: 18 | Fill #3

## 2016-09-24 MED FILL — SYMBICORT 160-4.5 MCG INH: 160-4.5 | 30 days supply | Qty: 10 | Fill #3

## 2016-09-24 MED FILL — IPRATROPIUM BR 0.02% SOLN: 0.02 | 7 days supply | Qty: 188 | Fill #7

## 2016-09-24 MED FILL — VENTOLIN HFA 90 MCG INHALER: 108 (90 BAS | 30 days supply | Qty: 18 | Fill #4

## 2016-09-26 MED FILL — ALBUTEROL 0.083% INHAL SOLN: (2.5 MG/3ML | 8 days supply | Qty: 90 | Fill #5

## 2016-10-22 MED FILL — SYMBICORT 160-4.5 MCG INH: 160-4.5 | 30 days supply | Qty: 10 | Fill #4

## 2016-10-24 ENCOUNTER — Other Ambulatory Visit: Payer: Self-pay | Admitting: Critical Care Medicine

## 2016-10-29 ENCOUNTER — Telehealth: Payer: Self-pay | Admitting: Internal Medicine

## 2016-10-29 MED ORDER — ALBUTEROL SULFATE HFA 108 (90 BASE) MCG/ACT IN AERS: 2.0000 | INHALATION_SPRAY | RESPIRATORY_TRACT | 0 refills | Status: DC | PRN

## 2016-10-29 MED FILL — VENTOLIN HFA 90 MCG INHALER: 108 (90 BAS | 30 days supply | Qty: 18 | Fill #0

## 2016-10-29 NOTE — Telephone Encounter (Signed)
Patient came to request medication refill for albuterol (PROVENTIL HFA;VENTOLIN HFA) 108 (90 Base) MCG/ACT inhaler.   Please contact pt if medication is approved.    Thank you

## 2016-11-05 MED FILL — IPRATROPIUM BR 0.02% SOLN: 0.02 | 7 days supply | Qty: 188 | Fill #8

## 2016-11-05 MED FILL — ALBUTEROL 0.083% INHAL SOLN: (2.5 MG/3ML | 8 days supply | Qty: 90 | Fill #6

## 2016-11-09 ENCOUNTER — Ambulatory Visit: Payer: Self-pay | Attending: Family Medicine | Admitting: Family Medicine

## 2016-11-09 ENCOUNTER — Encounter: Payer: Self-pay | Admitting: Family Medicine

## 2016-11-09 VITALS — BP 170/97 | HR 87 | Temp 98.5°F | Ht 64.0 in | Wt 134.4 lb

## 2016-11-09 DIAGNOSIS — F17210 Nicotine dependence, cigarettes, uncomplicated: Secondary | ICD-10-CM | POA: Insufficient documentation

## 2016-11-09 DIAGNOSIS — J449 Chronic obstructive pulmonary disease, unspecified: Secondary | ICD-10-CM

## 2016-11-09 DIAGNOSIS — I10 Essential (primary) hypertension: Secondary | ICD-10-CM | POA: Insufficient documentation

## 2016-11-09 DIAGNOSIS — R0981 Nasal congestion: Secondary | ICD-10-CM | POA: Insufficient documentation

## 2016-11-09 DIAGNOSIS — Z7951 Long term (current) use of inhaled steroids: Secondary | ICD-10-CM | POA: Insufficient documentation

## 2016-11-09 DIAGNOSIS — Z720 Tobacco use: Secondary | ICD-10-CM

## 2016-11-09 DIAGNOSIS — J42 Unspecified chronic bronchitis: Secondary | ICD-10-CM | POA: Insufficient documentation

## 2016-11-09 MED ORDER — FLUTICASONE PROPIONATE 50 MCG/ACT NA SUSP: 2.0000 | Freq: Every day | NASAL | 6 refills | Status: DC

## 2016-11-09 MED ORDER — ALBUTEROL SULFATE HFA 108 (90 BASE) MCG/ACT IN AERS: 2.0000 | INHALATION_SPRAY | RESPIRATORY_TRACT | 5 refills | Status: DC | PRN

## 2016-11-09 MED ORDER — BUDESONIDE-FORMOTEROL FUMARATE 160-4.5 MCG/ACT IN AERO: 2.0000 | INHALATION_SPRAY | Freq: Two times a day (BID) | RESPIRATORY_TRACT | 4 refills | Status: DC

## 2016-11-09 MED ORDER — IPRATROPIUM BROMIDE 0.02 % IN SOLN: 0.5000 mg | Freq: Four times a day (QID) | RESPIRATORY_TRACT | 3 refills | Status: DC

## 2016-11-09 MED ORDER — ALBUTEROL SULFATE (2.5 MG/3ML) 0.083% IN NEBU: 2.5000 mg | INHALATION_SOLUTION | Freq: Four times a day (QID) | RESPIRATORY_TRACT | 5 refills | Status: DC | PRN

## 2016-11-09 MED ORDER — AMLODIPINE BESYLATE 10 MG PO TABS: 10.0000 mg | ORAL_TABLET | Freq: Every day | ORAL | 3 refills | Status: DC

## 2016-11-09 MED FILL — AMLODIPINE BESYLATE 10 MG T: 10 | 30 days supply | Qty: 30 | Fill #0

## 2016-11-09 MED FILL — FLUTICASONE PROP 50 MCG SPR: 50 | 30 days supply | Qty: 16 | Fill #0

## 2016-11-09 NOTE — Progress Notes (Signed)
Subjective:  Patient ID: Tanya Jordan, female    DOB: 1965-12-28  Age: 50 y.o. MRN: [id number]  CC: COPD   HPI Tanya Jordan is a 50 year old female with a history of COPD, tobacco abuse who presents to the clinic for follow-up visit.  She has been compliant with her medications but has had some cough (sometimes productive) and shortness of breath which is worse at night. She has cut back on her smoking from 10 cigarettes per day and is now doing 3-5 cigarettes per day. Denies wheezing or chest pain. Used Mucinex in the past which did not help; also endorses nasal congestion.  She was seen by Dr. Joya Gaskins (pulmonary) 4 months ago  Blood pressure is elevated today and was also elevated at her last office visit but she is not on any antihypertensive.  Past Medical History:  Diagnosis Date  . COPD (chronic obstructive pulmonary disease) ([place])     History reviewed. No pertinent surgical history.  No Known Allergies   Outpatient Medications Prior to Visit  Medication Sig Dispense Refill  . albuterol (PROVENTIL HFA;VENTOLIN HFA) 108 (90 Base) MCG/ACT inhaler Inhale 2 puffs into the lungs every 4 (four) hours as needed for wheezing or shortness of breath. 18 g 0  . albuterol (PROVENTIL) (2.5 MG/3ML) 0.083% nebulizer solution Take 3 mLs (2.5 mg total) by nebulization every 6 (six) hours as needed for wheezing or shortness of breath. 75 mL 12  . budesonide-formoterol (SYMBICORT) 160-4.5 MCG/ACT inhaler Inhale 2 puffs into the lungs 2 (two) times daily. 3 Inhaler 4  . ipratropium (ATROVENT) 0.02 % nebulizer solution Take 2.5 mLs (0.5 mg total) by nebulization 4 (four) times daily. 75 mL 12  . nicotine (NICODERM CQ) 21 mg/24hr patch Place 1 patch (21 mg total) onto the skin daily. (Patient not taking: Reported on 11/09/2016) 28 patch 0   No facility-administered medications prior to visit.     ROS Review of Systems  Constitutional: Negative for activity change, appetite change and  fatigue.  HENT: Positive for congestion. Negative for sinus pressure and sore throat.   Eyes: Negative for visual disturbance.  Respiratory: Positive for cough and shortness of breath. Negative for chest tightness and wheezing.   Cardiovascular: Negative for chest pain and palpitations.  Gastrointestinal: Negative for abdominal distention, abdominal pain and constipation.  Endocrine: Negative for polydipsia.  Genitourinary: Negative for dysuria and frequency.  Musculoskeletal: Negative for arthralgias and back pain.  Skin: Negative for rash.  Neurological: Negative for tremors, light-headedness and numbness.  Hematological: Does not bruise/bleed easily.  Psychiatric/Behavioral: Negative for agitation and behavioral problems.    Objective:  BP (!) 170/97 (BP Location: Right Arm, Patient Position: Sitting, Cuff Size: Small)   Pulse 87   Temp 98.5 F (36.9 C) (Oral)   Ht '5\' 4"'$  (1.626 m)   Wt 134 lb 6.4 oz (61 kg)   LMP 03/29/2011   SpO2 97%   BMI 23.07 kg/m   BP/Weight 11/09/2016 07/18/2016 8/75/6433  Systolic BP 295 188 416  Diastolic BP 97 83 82  Wt. (Lbs) 134.4 133 136  BMI 23.07 22.83 23.33      Physical Exam  Constitutional: She is oriented to person, place, and time. She appears well-developed and well-nourished.  Neck: No JVD present.  Cardiovascular: Normal rate, normal heart sounds and intact distal pulses.   No murmur heard. Pulmonary/Chest: Effort normal and breath sounds normal. She has no wheezes. She has no rales. She exhibits no tenderness.  Abdominal: Soft. Bowel sounds  are normal. She exhibits no distension and no mass. There is no tenderness.  Musculoskeletal: Normal range of motion.  Neurological: She is alert and oriented to person, place, and time.  Skin: Skin is warm and dry.  Psychiatric: She has a normal mood and affect.     Assessment & Plan:   1. Tobacco abuse Spent 3 minutes counseling on cessation and hazardous effect of smoking She has cut  back and is working on quitting  2. Essential hypertension Newly diagnosed Low-sodium diet Discussed side effect profile - amLODipine (NORVASC) 10 MG tablet; Take 1 tablet (10 mg total) by mouth daily.  Dispense: 30 tablet; Refill: 3 - COMPLETE METABOLIC PANEL WITH GFR; Future - Lipid panel; Future  3. Nasal congestion - fluticasone (FLONASE) 50 MCG/ACT nasal spray; Place 2 sprays into both nostrils daily.  Dispense: 16 g; Refill: 6  4. COPD with chronic bronchitis (HCC) No acute exacerbation with oxygen saturation currently stable Advised that smoking cessation will retard progression of condition. We'll consider PFT at next visit. - budesonide-formoterol (SYMBICORT) 160-4.5 MCG/ACT inhaler; Inhale 2 puffs into the lungs 2 (two) times daily.  Dispense: 3 Inhaler; Refill: 4 - ipratropium (ATROVENT) 0.02 % nebulizer solution; Take 2.5 mLs (0.5 mg total) by nebulization 4 (four) times daily.  Dispense: 75 mL; Refill: 3 - albuterol (PROVENTIL) (2.5 MG/3ML) 0.083% nebulizer solution; Take 3 mLs (2.5 mg total) by nebulization every 6 (six) hours as needed for wheezing or shortness of breath.  Dispense: 75 mL; Refill: 5 - albuterol (PROVENTIL HFA;VENTOLIN HFA) 108 (90 Base) MCG/ACT inhaler; Inhale 2 puffs into the lungs every 4 (four) hours as needed for wheezing or shortness of breath.  Dispense: 18 g; Refill: 5   Meds ordered this encounter  Medications  . amLODipine (NORVASC) 10 MG tablet    Sig: Take 1 tablet (10 mg total) by mouth daily.    Dispense:  30 tablet    Refill:  3  . budesonide-formoterol (SYMBICORT) 160-4.5 MCG/ACT inhaler    Sig: Inhale 2 puffs into the lungs 2 (two) times daily.    Dispense:  3 Inhaler    Refill:  4  . ipratropium (ATROVENT) 0.02 % nebulizer solution    Sig: Take 2.5 mLs (0.5 mg total) by nebulization 4 (four) times daily.    Dispense:  75 mL    Refill:  3  . albuterol (PROVENTIL) (2.5 MG/3ML) 0.083% nebulizer solution    Sig: Take 3 mLs (2.5 mg  total) by nebulization every 6 (six) hours as needed for wheezing or shortness of breath.    Dispense:  75 mL    Refill:  5  . albuterol (PROVENTIL HFA;VENTOLIN HFA) 108 (90 Base) MCG/ACT inhaler    Sig: Inhale 2 puffs into the lungs every 4 (four) hours as needed for wheezing or shortness of breath.    Dispense:  18 g    Refill:  5  . fluticasone (FLONASE) 50 MCG/ACT nasal spray    Sig: Place 2 sprays into both nostrils daily.    Dispense:  16 g    Refill:  6    Follow-up: Return in about 1 month (around 12/10/2016) for Follow-up on hypertension.   Arnoldo Morale MD

## 2016-11-09 NOTE — Patient Instructions (Signed)
Hypertension Hypertension, commonly called high blood pressure, is when the force of blood pumping through your arteries is too strong. Your arteries are the blood vessels that carry blood from your heart throughout your body. A blood pressure reading consists of a higher number over a lower number, such as 110/72. The higher number (systolic) is the pressure inside your arteries when your heart pumps. The lower number (diastolic) is the pressure inside your arteries when your heart relaxes. Ideally you want your blood pressure below 120/80. Hypertension forces your heart to work harder to pump blood. Your arteries may become narrow or stiff. Having untreated or uncontrolled hypertension can cause heart attack, stroke, kidney disease, and other problems. What increases the risk? Some risk factors for high blood pressure are controllable. Others are not. Risk factors you cannot control include:  Race. You may be at higher risk if you are African American.  Age. Risk increases with age.  Gender. Men are at higher risk than women before age 45 years. After age 65, women are at higher risk than men. Risk factors you can control include:  Not getting enough exercise or physical activity.  Being overweight.  Getting too much fat, sugar, calories, or salt in your diet.  Drinking too much alcohol. What are the signs or symptoms? Hypertension does not usually cause signs or symptoms. Extremely high blood pressure (hypertensive crisis) may cause headache, anxiety, shortness of breath, and nosebleed. How is this diagnosed? To check if you have hypertension, your health care provider will measure your blood pressure while you are seated, with your arm held at the level of your heart. It should be measured at least twice using the same arm. Certain conditions can cause a difference in blood pressure between your right and left arms. A blood pressure reading that is higher than normal on one occasion does  not mean that you need treatment. If it is not clear whether you have high blood pressure, you may be asked to return on a different day to have your blood pressure checked again. Or, you may be asked to monitor your blood pressure at home for 1 or more weeks. How is this treated? Treating high blood pressure includes making lifestyle changes and possibly taking medicine. Living a healthy lifestyle can help lower high blood pressure. You may need to change some of your habits. Lifestyle changes may include:  Following the DASH diet. This diet is high in fruits, vegetables, and whole grains. It is low in salt, red meat, and added sugars.  Keep your sodium intake below 2,300 mg per day.  Getting at least 30-45 minutes of aerobic exercise at least 4 times per week.  Losing weight if necessary.  Not smoking.  Limiting alcoholic beverages.  Learning ways to reduce stress. Your health care provider may prescribe medicine if lifestyle changes are not enough to get your blood pressure under control, and if one of the following is true:  You are 18-59 years of age and your systolic blood pressure is above 140.  You are 60 years of age or older, and your systolic blood pressure is above 150.  Your diastolic blood pressure is above 90.  You have diabetes, and your systolic blood pressure is over 140 or your diastolic blood pressure is over 90.  You have kidney disease and your blood pressure is above 140/90.  You have heart disease and your blood pressure is above 140/90. Your personal target blood pressure may vary depending on your medical   conditions, your age, and other factors. Follow these instructions at home:  Have your blood pressure rechecked as directed by your health care provider.  Take medicines only as directed by your health care provider. Follow the directions carefully. Blood pressure medicines must be taken as prescribed. The medicine does not work as well when you skip  doses. Skipping doses also puts you at risk for problems.  Do not smoke.  Monitor your blood pressure at home as directed by your health care provider. Contact a health care provider if:  You think you are having a reaction to medicines taken.  You have recurrent headaches or feel dizzy.  You have swelling in your ankles.  You have trouble with your vision. Get help right away if:  You develop a severe headache or confusion.  You have unusual weakness, numbness, or feel faint.  You have severe chest or abdominal pain.  You vomit repeatedly.  You have trouble breathing. This information is not intended to replace advice given to you by your health care provider. Make sure you discuss any questions you have with your health care provider. Document Released: 11/19/2005 Document Revised: 04/26/2016 Document Reviewed: 09/11/2013 Elsevier Interactive Patient Education  2017 Elsevier Inc.  

## 2016-11-16 ENCOUNTER — Ambulatory Visit: Payer: Self-pay | Attending: Family Medicine

## 2016-11-16 DIAGNOSIS — I10 Essential (primary) hypertension: Secondary | ICD-10-CM | POA: Insufficient documentation

## 2016-11-16 LAB — COMPLETE METABOLIC PANEL WITH GFR
ALT: 16 U/L (ref 6–29)
AST: 17 U/L (ref 10–35)
Albumin: 4.7 g/dL (ref 3.6–5.1)
Alkaline Phosphatase: 71 U/L (ref 33–130)
BUN: 10 mg/dL (ref 7–25)
CALCIUM: 9.6 mg/dL (ref 8.6–10.4)
CHLORIDE: 108 mmol/L (ref 98–110)
CO2: 26 mmol/L (ref 20–31)
Creat: 0.82 mg/dL (ref 0.50–1.05)
GFR, EST NON AFRICAN AMERICAN: 83 mL/min (ref >=60)
Glucose, Bld: 105 mg/dL — ABNORMAL HIGH (ref 65–99)
POTASSIUM: 4.1 mmol/L (ref 3.5–5.3)
Sodium: 144 mmol/L (ref 135–146)
Total Bilirubin: 0.4 mg/dL (ref 0.2–1.2)
Total Protein: 7.1 g/dL (ref 6.1–8.1)

## 2016-11-16 LAB — LIPID PANEL
CHOL/HDL RATIO: 2.8 ratio (ref <5.0)
CHOLESTEROL: 187 mg/dL (ref <200)
HDL: 67 mg/dL (ref >50)
LDL Cholesterol: 104 mg/dL — ABNORMAL HIGH (ref <100)
TRIGLYCERIDES: 80 mg/dL (ref <150)
VLDL: 16 mg/dL (ref <30)

## 2016-11-16 NOTE — Progress Notes (Signed)
Patient here for lab visit only 

## 2016-11-21 ENCOUNTER — Telehealth: Payer: Self-pay | Admitting: Family Medicine

## 2016-11-21 ENCOUNTER — Telehealth: Payer: Self-pay

## 2016-11-21 NOTE — Telephone Encounter (Signed)
Writer called patient and LVM regarding her lab results and asked her to please call back to discuss.

## 2016-11-21 NOTE — Telephone Encounter (Signed)
-----   Message from Arnoldo Morale, MD sent at 11/20/2016  2:18 PM EST ----- Please inform the patient that labs are normal. Thank you.

## 2016-11-21 NOTE — Telephone Encounter (Signed)
Patient is returning call to nurse regarding results...patient stated if she does not answer please leave results on voicemail

## 2016-11-22 ENCOUNTER — Other Ambulatory Visit: Payer: Self-pay | Admitting: *Deleted

## 2016-11-22 ENCOUNTER — Telehealth: Payer: Self-pay

## 2016-11-22 DIAGNOSIS — J449 Chronic obstructive pulmonary disease, unspecified: Secondary | ICD-10-CM

## 2016-11-22 MED ORDER — BUDESONIDE-FORMOTEROL FUMARATE 160-4.5 MCG/ACT IN AERO: 2.0000 | INHALATION_SPRAY | Freq: Two times a day (BID) | RESPIRATORY_TRACT | 3 refills | Status: DC

## 2016-11-22 MED FILL — $VENTOLIN HFA 18G INHALER: 108 (90 BAS | 18 days supply | Qty: 18 | Fill #0

## 2016-11-22 NOTE — Telephone Encounter (Signed)
Writer called patient back and LVM as patient requested with lab results.  Encouraged patient to call back with any questions.

## 2016-11-22 NOTE — Telephone Encounter (Signed)
PRINTED FOR PASS PROGRAM 

## 2016-11-22 NOTE — Telephone Encounter (Signed)
-----   Message from Arnoldo Morale, MD sent at 11/20/2016  2:18 PM EST ----- Please inform the patient that labs are normal. Thank you.

## 2016-11-23 ENCOUNTER — Other Ambulatory Visit: Payer: Self-pay | Admitting: *Deleted

## 2016-11-23 DIAGNOSIS — J449 Chronic obstructive pulmonary disease, unspecified: Secondary | ICD-10-CM

## 2016-11-23 MED ORDER — ALBUTEROL SULFATE HFA 108 (90 BASE) MCG/ACT IN AERS: 2.0000 | INHALATION_SPRAY | RESPIRATORY_TRACT | 3 refills | Status: DC | PRN

## 2016-11-23 NOTE — Telephone Encounter (Signed)
PRINTED FOR PASS PROGRAM 

## 2016-12-06 MED FILL — ALBUTEROL 0.083% INHAL SOLN: (2.5 MG/3ML | 8 days supply | Qty: 90 | Fill #7

## 2016-12-06 MED FILL — IPRATROPIUM BR 0.02% SOLN: 0.02 | 7 days supply | Qty: 188 | Fill #9

## 2016-12-06 MED FILL — AMLODIPINE BESYLATE 10 MG T: 10 | 30 days supply | Qty: 30 | Fill #1

## 2016-12-06 MED FILL — SYMBICORT 160-4.5 MCG INH: 160-4.5 | 30 days supply | Qty: 10 | Fill #5

## 2016-12-14 ENCOUNTER — Ambulatory Visit: Payer: Self-pay | Attending: Family Medicine | Admitting: Family Medicine

## 2016-12-14 ENCOUNTER — Encounter: Payer: Self-pay | Admitting: Family Medicine

## 2016-12-14 VITALS — BP 132/81 | HR 77 | Temp 98.0°F | Ht 64.0 in | Wt 131.6 lb

## 2016-12-14 DIAGNOSIS — I10 Essential (primary) hypertension: Secondary | ICD-10-CM | POA: Insufficient documentation

## 2016-12-14 DIAGNOSIS — J42 Unspecified chronic bronchitis: Secondary | ICD-10-CM | POA: Insufficient documentation

## 2016-12-14 DIAGNOSIS — J449 Chronic obstructive pulmonary disease, unspecified: Secondary | ICD-10-CM

## 2016-12-14 DIAGNOSIS — J0120 Acute ethmoidal sinusitis, unspecified: Secondary | ICD-10-CM

## 2016-12-14 DIAGNOSIS — J322 Chronic ethmoidal sinusitis: Secondary | ICD-10-CM | POA: Insufficient documentation

## 2016-12-14 MED ORDER — AMOXICILLIN 500 MG PO CAPS: 500.0000 mg | ORAL_CAPSULE | Freq: Three times a day (TID) | ORAL | 0 refills | Status: DC

## 2016-12-14 MED ORDER — ALBUTEROL SULFATE HFA 108 (90 BASE) MCG/ACT IN AERS: 2.0000 | INHALATION_SPRAY | RESPIRATORY_TRACT | 3 refills | Status: DC | PRN

## 2016-12-14 MED FILL — ?AMOXICILLIN 500 MG CAPSULE: 500 | 10 days supply | Qty: 30 | Fill #0

## 2016-12-14 MED FILL — $VENTOLIN HFA 18G INHALER: 108 (90 BAS | 25 days supply | Qty: 18 | Fill #0

## 2016-12-14 NOTE — Progress Notes (Signed)
Subjective:  Patient ID: Tanya Jordan, female    DOB: [birth year]  Age: 51 y.o. MRN: [id number]  CC: COPD; Follow-up; Hypertension; Cough (productive- clear-yellow); and Nasal Congestion   HPI ALIESE BRANNUM is 51 year old female with a history of COPD who presents today for a follow-up after being commenced on amlodipine for newly diagnosed hypertension. Blood pressure is normal today however she complains of a two-week history of cough productive of yellowish sputum, nasal congestion, right-sided headache but no fever. Symptoms have not responded to the use of OTC NyQuil or DayQuil.  Past Medical History:  Diagnosis Date  . COPD (chronic obstructive pulmonary disease) ([place])     History reviewed. No pertinent surgical history.  No Known Allergies   Outpatient Medications Prior to Visit  Medication Sig Dispense Refill  . albuterol (PROVENTIL) (2.5 MG/3ML) 0.083% nebulizer solution Take 3 mLs (2.5 mg total) by nebulization every 6 (six) hours as needed for wheezing or shortness of breath. 75 mL 5  . amLODipine (NORVASC) 10 MG tablet Take 1 tablet (10 mg total) by mouth daily. 30 tablet 3  . budesonide-formoterol (SYMBICORT) 160-4.5 MCG/ACT inhaler Inhale 2 puffs into the lungs 2 (two) times daily. 3 Inhaler 3  . fluticasone (FLONASE) 50 MCG/ACT nasal spray Place 2 sprays into both nostrils daily. 16 g 6  . ipratropium (ATROVENT) 0.02 % nebulizer solution Take 2.5 mLs (0.5 mg total) by nebulization 4 (four) times daily. 75 mL 3  . albuterol (PROVENTIL HFA;VENTOLIN HFA) 108 (90 Base) MCG/ACT inhaler Inhale 2 puffs into the lungs every 4 (four) hours as needed for wheezing or shortness of breath. 54 g 3  . nicotine (NICODERM CQ) 21 mg/24hr patch Place 1 patch (21 mg total) onto the skin daily. (Patient not taking: Reported on 12/14/2016) 28 patch 0   No facility-administered medications prior to visit.     ROS Review of Systems  Constitutional: Negative for activity change and  appetite change.  HENT:       See history of present illness  Respiratory: Negative for chest tightness, shortness of breath and wheezing.   Cardiovascular: Negative for chest pain and palpitations.  Gastrointestinal: Negative for abdominal distention, abdominal pain and constipation.  Genitourinary: Negative.   Musculoskeletal: Negative.   Neurological: Positive for headaches.  Psychiatric/Behavioral: Negative for behavioral problems and dysphoric mood.    Objective:  BP 132/81 (BP Location: Right Arm, Patient Position: Sitting, Cuff Size: Small)   Pulse 77   Temp 98 F (36.7 C) (Oral)   Ht '5\' 4"'$  (1.626 m)   Wt 131 lb 9.6 oz (59.7 kg)   LMP 03/29/2011   SpO2 95%   BMI 22.59 kg/m   BP/Weight 12/14/2016 11/09/2016 4/00/8676  Systolic BP 195 093 267  Diastolic BP 81 97 83  Wt. (Lbs) 131.6 134.4 133  BMI 22.59 23.07 22.83      Physical Exam  Constitutional: She is oriented to person, place, and time. She appears well-developed and well-nourished.  Nasal speech  HENT:  Right Ear: External ear normal.  Left Ear: External ear normal.  Mouth/Throat: Oropharynx is clear and moist.  Cardiovascular: Normal rate, normal heart sounds and intact distal pulses.   No murmur heard. Pulmonary/Chest: Effort normal and breath sounds normal. She has no wheezes. She has no rales. She exhibits no tenderness.  Abdominal: Soft. Bowel sounds are normal. She exhibits no distension and no mass. There is no tenderness.  Musculoskeletal: Normal range of motion.  Neurological: She is alert and oriented to  person, place, and time.     Assessment & Plan:   1. COPD with chronic bronchitis (HCC) - albuterol (PROVENTIL HFA;VENTOLIN HFA) 108 (90 Base) MCG/ACT inhaler; Inhale 2 puffs into the lungs every 4 (four) hours as needed for wheezing or shortness of breath.  Dispense: 18 g; Refill: 3  2. Acute non-recurrent ethmoidal sinusitis - amoxicillin (AMOXIL) 500 MG capsule; Take 1 capsule (500 mg  total) by mouth 3 (three) times daily.  Dispense: 30 capsule; Refill: 0   Meds ordered this encounter  Medications  . amoxicillin (AMOXIL) 500 MG capsule    Sig: Take 1 capsule (500 mg total) by mouth 3 (three) times daily.    Dispense:  30 capsule    Refill:  0  . albuterol (PROVENTIL HFA;VENTOLIN HFA) 108 (90 Base) MCG/ACT inhaler    Sig: Inhale 2 puffs into the lungs every 4 (four) hours as needed for wheezing or shortness of breath.    Dispense:  18 g    Refill:  3    Follow-up: Return in about 3 months (around 03/14/2017) for Follow-up on chronic medical conditions.   Arnoldo Morale MD

## 2016-12-14 NOTE — Patient Instructions (Signed)

## 2017-01-07 MED FILL — $VENTOLIN HFA 18G INHALER: 108 (90 BAS | 18 days supply | Qty: 18 | Fill #1

## 2017-01-07 MED FILL — AMLODIPINE BESYLATE 10 MG T: 10 | 30 days supply | Qty: 30 | Fill #2

## 2017-01-07 MED FILL — IPRATROPIUM BR 0.02% SOLN: 0.02 | 7 days supply | Qty: 188 | Fill #10

## 2017-01-25 MED FILL — $VENTOLIN HFA 18G INHALER: 108 (90 BAS | 16 days supply | Qty: 18 | Fill #1

## 2017-02-07 MED FILL — ALBUTEROL 0.083% INHAL SOLN: (2.5 MG/3ML | 7 days supply | Qty: 90 | Fill #0

## 2017-02-07 MED FILL — ?AMLODIPINE BESYLATE 10 MG: 10 | 30 days supply | Qty: 30 | Fill #3

## 2017-02-07 MED FILL — IPRATROPIUM BR 0.02% SOLN: 0.02 | 6 days supply | Qty: 3 | Fill #0

## 2017-02-07 MED FILL — $VENTOLIN HFA 18G INHALER: 108 (90 BAS | 18 days supply | Qty: 18 | Fill #2

## 2017-02-08 MED FILL — SYMBICORT 160-4.5 MCG INH: 160-4.5 | 30 days supply | Qty: 10 | Fill #6

## 2017-02-25 MED FILL — $VENTOLIN HFA 18G INHALER: 108 (90 BAS | 16 days supply | Qty: 18 | Fill #2

## 2017-03-11 ENCOUNTER — Ambulatory Visit: Payer: Self-pay | Attending: Family Medicine | Admitting: Family Medicine

## 2017-03-11 ENCOUNTER — Encounter: Payer: Self-pay | Admitting: Family Medicine

## 2017-03-11 ENCOUNTER — Telehealth: Payer: Self-pay

## 2017-03-11 VITALS — BP 137/88 | HR 79 | Temp 98.0°F | Ht 64.0 in | Wt 128.8 lb

## 2017-03-11 DIAGNOSIS — F17210 Nicotine dependence, cigarettes, uncomplicated: Secondary | ICD-10-CM | POA: Insufficient documentation

## 2017-03-11 DIAGNOSIS — J309 Allergic rhinitis, unspecified: Secondary | ICD-10-CM | POA: Insufficient documentation

## 2017-03-11 DIAGNOSIS — I10 Essential (primary) hypertension: Secondary | ICD-10-CM | POA: Insufficient documentation

## 2017-03-11 DIAGNOSIS — Z1211 Encounter for screening for malignant neoplasm of colon: Secondary | ICD-10-CM

## 2017-03-11 DIAGNOSIS — J449 Chronic obstructive pulmonary disease, unspecified: Secondary | ICD-10-CM

## 2017-03-11 DIAGNOSIS — J302 Other seasonal allergic rhinitis: Secondary | ICD-10-CM

## 2017-03-11 DIAGNOSIS — Z720 Tobacco use: Secondary | ICD-10-CM

## 2017-03-11 DIAGNOSIS — J42 Unspecified chronic bronchitis: Secondary | ICD-10-CM | POA: Insufficient documentation

## 2017-03-11 MED ORDER — AMLODIPINE BESYLATE 10 MG PO TABS: 10.0000 mg | ORAL_TABLET | Freq: Every day | ORAL | 6 refills | Status: DC

## 2017-03-11 MED ORDER — CETIRIZINE HCL 10 MG PO TABS: 10.0000 mg | ORAL_TABLET | Freq: Every day | ORAL | 1 refills | Status: DC

## 2017-03-11 MED ORDER — FLUTICASONE PROPIONATE 50 MCG/ACT NA SUSP: 2.0000 | Freq: Every day | NASAL | 6 refills | Status: DC

## 2017-03-11 MED ORDER — ALBUTEROL SULFATE (2.5 MG/3ML) 0.083% IN NEBU: 2.5000 mg | INHALATION_SOLUTION | Freq: Four times a day (QID) | RESPIRATORY_TRACT | 6 refills | Status: DC | PRN

## 2017-03-11 MED ORDER — IPRATROPIUM BROMIDE 0.02 % IN SOLN: 0.5000 mg | Freq: Four times a day (QID) | RESPIRATORY_TRACT | 5 refills | Status: DC

## 2017-03-11 MED ORDER — ALBUTEROL SULFATE HFA 108 (90 BASE) MCG/ACT IN AERS: 2.0000 | INHALATION_SPRAY | RESPIRATORY_TRACT | 6 refills | Status: DC | PRN

## 2017-03-11 MED ORDER — BUDESONIDE-FORMOTEROL FUMARATE 160-4.5 MCG/ACT IN AERO: 2.0000 | INHALATION_SPRAY | Freq: Two times a day (BID) | RESPIRATORY_TRACT | 3 refills | Status: DC

## 2017-03-11 MED ORDER — TIOTROPIUM BROMIDE MONOHYDRATE 18 MCG IN CAPS: 18.0000 ug | ORAL_CAPSULE | Freq: Every day | RESPIRATORY_TRACT | 6 refills | Status: DC

## 2017-03-11 MED FILL — FLUTICASONE PROP 50 MCG SPR: 50 | 30 days supply | Qty: 16 | Fill #0

## 2017-03-11 MED FILL — AMLODIPINE BESYLATE 10 MG T: 10 | 30 days supply | Qty: 30 | Fill #0

## 2017-03-11 MED FILL — ?ALBUTEROL SUL 2.5 MG/3 MLS: (2.5 MG/3ML | 7 days supply | Qty: 90 | Fill #0

## 2017-03-11 MED FILL — ?CETIRIZINE HCL 10 MG TABLE: 10 | 30 days supply | Qty: 30 | Fill #0

## 2017-03-11 NOTE — Progress Notes (Signed)
Refills needed-albuterol, amlodipine  Needs two albuteral inhalers a month- our pharmacy said they won't refill for 30 more days.  Only has enough til this weekend  Taking alka seltzer allergy/cough- would like to know if this is okay

## 2017-03-11 NOTE — Patient Instructions (Signed)
Steps to Quit Smoking Smoking tobacco can be harmful to your health and can affect almost every organ in your body. Smoking puts you, and those around you, at risk for developing many serious chronic diseases. Quitting smoking is difficult, but it is one of the best things that you can do for your health. It is never too late to quit. What are the benefits of quitting smoking? When you quit smoking, you lower your risk of developing serious diseases and conditions, such as:  Lung cancer or lung disease, such as COPD.  Heart disease.  Stroke.  Heart attack.  Infertility.  Osteoporosis and bone fractures.  Additionally, symptoms such as coughing, wheezing, and shortness of breath may get better when you quit. You may also find that you get sick less often because your body is stronger at fighting off colds and infections. If you are pregnant, quitting smoking can help to reduce your chances of having a baby of low birth weight. How do I get ready to quit? When you decide to quit smoking, create a plan to make sure that you are successful. Before you quit:  Pick a date to quit. Set a date within the next two weeks to give you time to prepare.  Write down the reasons why you are quitting. Keep this list in places where you will see it often, such as on your bathroom mirror or in your car or wallet.  Identify the people, places, things, and activities that make you want to smoke (triggers) and avoid them. Make sure to take these actions: ? Throw away all cigarettes at home, at work, and in your car. ? Throw away smoking accessories, such as ashtrays and lighters. ? Clean your car and make sure to empty the ashtray. ? Clean your home, including curtains and carpets.  Tell your family, friends, and coworkers that you are quitting. Support from your loved ones can make quitting easier.  Talk with your health care provider about your options for quitting smoking.  Find out what treatment  options are covered by your health insurance.  What strategies can I use to quit smoking? Talk with your healthcare provider about different strategies to quit smoking. Some strategies include:  Quitting smoking altogether instead of gradually lessening how much you smoke over a period of time. Research shows that quitting "cold turkey" is more successful than gradually quitting.  Attending in-person counseling to help you build problem-solving skills. You are more likely to have success in quitting if you attend several counseling sessions. Even short sessions of 10 minutes can be effective.  Finding resources and support systems that can help you to quit smoking and remain smoke-free after you quit. These resources are most helpful when you use them often. They can include: ? Online chats with a counselor. ? Telephone quitlines. ? Printed self-help materials. ? Support groups or group counseling. ? Text messaging programs. ? Mobile phone applications.  Taking medicines to help you quit smoking. (If you are pregnant or breastfeeding, talk with your health care provider first.) Some medicines contain nicotine and some do not. Both types of medicines help with cravings, but the medicines that include nicotine help to relieve withdrawal symptoms. Your health care provider may recommend: ? Nicotine patches, gum, or lozenges. ? Nicotine inhalers or sprays. ? Non-nicotine medicine that is taken by mouth.  Talk with your health care provider about combining strategies, such as taking medicines while you are also receiving in-person counseling. Using these two strategies together   makes you more likely to succeed in quitting than if you used either strategy on its own. If you are pregnant or breastfeeding, talk with your health care provider about finding counseling or other support strategies to quit smoking. Do not take medicine to help you quit smoking unless told to do so by your health care  provider. What things can I do to make it easier to quit? Quitting smoking might feel overwhelming at first, but there is a lot that you can do to make it easier. Take these important actions:  Reach out to your family and friends and ask that they support and encourage you during this time. Call telephone quitlines, reach out to support groups, or work with a counselor for support.  Ask people who smoke to avoid smoking around you.  Avoid places that trigger you to smoke, such as bars, parties, or smoke-break areas at work.  Spend time around people who do not smoke.  Lessen stress in your life, because stress can be a smoking trigger for some people. To lessen stress, try: ? Exercising regularly. ? Deep-breathing exercises. ? Yoga. ? Meditating. ? Performing a body scan. This involves closing your eyes, scanning your body from head to toe, and noticing which parts of your body are particularly tense. Purposefully relax the muscles in those areas.  Download or purchase mobile phone or tablet apps (applications) that can help you stick to your quit plan by providing reminders, tips, and encouragement. There are many free apps, such as QuitGuide from the CDC (Centers for Disease Control and Prevention). You can find other support for quitting smoking (smoking cessation) through smokefree.gov and other websites.  How will I feel when I quit smoking? Within the first 24 hours of quitting smoking, you may start to feel some withdrawal symptoms. These symptoms are usually most noticeable 2-3 days after quitting, but they usually do not last beyond 2-3 weeks. Changes or symptoms that you might experience include:  Mood swings.  Restlessness, anxiety, or irritation.  Difficulty concentrating.  Dizziness.  Strong cravings for sugary foods in addition to nicotine.  Mild weight gain.  Constipation.  Nausea.  Coughing or a sore throat.  Changes in how your medicines work in your  body.  A depressed mood.  Difficulty sleeping (insomnia).  After the first 2-3 weeks of quitting, you may start to notice more positive results, such as:  Improved sense of smell and taste.  Decreased coughing and sore throat.  Slower heart rate.  Lower blood pressure.  Clearer skin.  The ability to breathe more easily.  Fewer sick days.  Quitting smoking is very challenging for most people. Do not get discouraged if you are not successful the first time. Some people need to make many attempts to quit before they achieve long-term success. Do your best to stick to your quit plan, and talk with your health care provider if you have any questions or concerns. This information is not intended to replace advice given to you by your health care provider. Make sure you discuss any questions you have with your health care provider. Document Released: 11/13/2001 Document Revised: 07/17/2016 Document Reviewed: 04/05/2015 Elsevier Interactive Patient Education  2017 [employer] Inc.  

## 2017-03-11 NOTE — Telephone Encounter (Signed)
Called to find out if pt would like to have colonoscopy scheduled

## 2017-03-11 NOTE — Progress Notes (Signed)
Subjective:  Patient ID: Tanya Jordan, female    DOB: 05-24-1966  Age: 51 y.o. MRN: [id number]  CC: COPD (hard time breathing at night- wakes up at night); headaches; sinus problems; and Allergies   Tanya Jordan  is 51 year old female with a history of COPD, Hypertension who presents today for a follow-up. Blood pressure is normal today and she has been compliant with her antihypertensives.  She complains of persistent cough productive of yellowish sputum occasionally , nasal congestion, using her MDIs daily despite use of Symbicort. Symptoms are worse at night. Continues to smoke 5 cigarettes/day. Flonase has not helped much.  Would like a referral for colonoscopy.  Past Medical History:  Diagnosis Date  . COPD (chronic obstructive pulmonary disease) ([place])     History reviewed. No pertinent surgical history.   Outpatient Medications Prior to Visit  Medication Sig Dispense Refill  . albuterol (PROVENTIL HFA;VENTOLIN HFA) 108 (90 Base) MCG/ACT inhaler Inhale 2 puffs into the lungs every 4 (four) hours as needed for wheezing or shortness of breath. 18 g 3  . albuterol (PROVENTIL) (2.5 MG/3ML) 0.083% nebulizer solution Take 3 mLs (2.5 mg total) by nebulization every 6 (six) hours as needed for wheezing or shortness of breath. 75 mL 5  . amLODipine (NORVASC) 10 MG tablet Take 1 tablet (10 mg total) by mouth daily. 30 tablet 3  . budesonide-formoterol (SYMBICORT) 160-4.5 MCG/ACT inhaler Inhale 2 puffs into the lungs 2 (two) times daily. 3 Inhaler 3  . fluticasone (FLONASE) 50 MCG/ACT nasal spray Place 2 sprays into both nostrils daily. 16 g 6  . ipratropium (ATROVENT) 0.02 % nebulizer solution Take 2.5 mLs (0.5 mg total) by nebulization 4 (four) times daily. 75 mL 3  . nicotine (NICODERM CQ) 21 mg/24hr patch Place 1 patch (21 mg total) onto the skin daily. (Patient not taking: Reported on 12/14/2016) 28 patch 0  . amoxicillin (AMOXIL) 500 MG capsule Take 1 capsule (500 mg  total) by mouth 3 (three) times daily. (Patient not taking: Reported on 03/11/2017) 30 capsule 0   No facility-administered medications prior to visit.     ROS Review of Systems  Constitutional: Negative for activity change, appetite change and fatigue.  HENT: Negative for congestion, sinus pressure and sore throat.   Eyes: Negative for visual disturbance.  Respiratory: Positive for cough. Negative for chest tightness, shortness of breath and wheezing.   Cardiovascular: Negative for chest pain and palpitations.  Gastrointestinal: Negative for abdominal distention, abdominal pain and constipation.  Endocrine: Negative for polydipsia.  Genitourinary: Negative for dysuria and frequency.  Musculoskeletal: Negative for arthralgias and back pain.  Skin: Negative for rash.  Neurological: Negative for tremors, light-headedness and numbness.  Hematological: Does not bruise/bleed easily.  Psychiatric/Behavioral: Negative for agitation and behavioral problems.     Objective:  BP 137/88 (BP Location: Right Arm, Patient Position: Sitting, Cuff Size: Small)   Pulse 79   Temp 98 F (36.7 C) (Oral)   Ht '5\' 4"'$  (1.626 m)   Wt 128 lb 12.8 oz (58.4 kg)   LMP 03/29/2011   SpO2 95%   BMI 22.11 kg/m   BP/Weight 03/11/2017 12/14/2016 35/02/2991  Systolic BP 426 834 196  Diastolic BP 88 81 97  Wt. (Lbs) 128.8 131.6 134.4  BMI 22.11 22.59 23.07      Physical Exam Constitutional: She is oriented to person, place, and time. She appears well-developed and well-nourished.  Nasal speech  HENT:  Right Ear: External ear normal.  Left Ear: External  ear normal.  Mouth/Throat: Oropharynx is clear and moist.  Cardiovascular: Normal rate, normal heart sounds and intact distal pulses.   No murmur heard. Pulmonary/Chest: Effort normal and breath sounds normal. She has no wheezes. She has no rales. She exhibits no tenderness.  Abdominal: Soft. Bowel sounds are normal. She exhibits no distension and no mass.  There is no tenderness.  Musculoskeletal: Normal range of motion.  Neurological: She is alert and oriented to person, place, and time.    Assessment & Plan:   1. COPD with chronic bronchitis (HCC) Uncontrolled Spiriva added - albuterol (PROVENTIL HFA;VENTOLIN HFA) 108 (90 Base) MCG/ACT inhaler; Inhale 2 puffs into the lungs every 4 (four) hours as needed for wheezing or shortness of breath.  Dispense: 18 g; Refill: 6 - ipratropium (ATROVENT) 0.02 % nebulizer solution; Take 2.5 mLs (0.5 mg total) by nebulization 4 (four) times daily.  Dispense: 75 mL; Refill: 5 - budesonide-formoterol (SYMBICORT) 160-4.5 MCG/ACT inhaler; Inhale 2 puffs into the lungs 2 (two) times daily.  Dispense: 3 Inhaler; Refill: 3 - albuterol (PROVENTIL) (2.5 MG/3ML) 0.083% nebulizer solution; Take 3 mLs (2.5 mg total) by nebulization every 6 (six) hours as needed for wheezing or shortness of breath.  Dispense: 75 mL; Refill: 6 - tiotropium (SPIRIVA HANDIHALER) 18 MCG inhalation capsule; Place 1 capsule (18 mcg total) into inhaler and inhale daily.  Dispense: 30 capsule; Refill: 6  2. Essential hypertension Controlled - amLODipine (NORVASC) 10 MG tablet; Take 1 tablet (10 mg total) by mouth daily.  Dispense: 30 tablet; Refill: 6  3. Chronic seasonal allergic rhinitis due to other allergen Zyrtec added - fluticasone (FLONASE) 50 MCG/ACT nasal spray; Place 2 sprays into both nostrils daily.  Dispense: 16 g; Refill: 6 - cetirizine (ZYRTEC) 10 MG tablet; Take 1 tablet (10 mg total) by mouth daily.  Dispense: 30 tablet; Refill: 1  4. Tobacco abuse Spent 3 mins counseling on cessation and she is not ready to quit.  5. HCM Referred for colonoscopy Meds ordered this encounter  Medications  . albuterol (PROVENTIL HFA;VENTOLIN HFA) 108 (90 Base) MCG/ACT inhaler    Sig: Inhale 2 puffs into the lungs every 4 (four) hours as needed for wheezing or shortness of breath.    Dispense:  18 g    Refill:  6  . ipratropium  (ATROVENT) 0.02 % nebulizer solution    Sig: Take 2.5 mLs (0.5 mg total) by nebulization 4 (four) times daily.    Dispense:  75 mL    Refill:  5  . budesonide-formoterol (SYMBICORT) 160-4.5 MCG/ACT inhaler    Sig: Inhale 2 puffs into the lungs 2 (two) times daily.    Dispense:  3 Inhaler    Refill:  3  . albuterol (PROVENTIL) (2.5 MG/3ML) 0.083% nebulizer solution    Sig: Take 3 mLs (2.5 mg total) by nebulization every 6 (six) hours as needed for wheezing or shortness of breath.    Dispense:  75 mL    Refill:  6  . amLODipine (NORVASC) 10 MG tablet    Sig: Take 1 tablet (10 mg total) by mouth daily.    Dispense:  30 tablet    Refill:  6  . fluticasone (FLONASE) 50 MCG/ACT nasal spray    Sig: Place 2 sprays into both nostrils daily.    Dispense:  16 g    Refill:  6  . tiotropium (SPIRIVA HANDIHALER) 18 MCG inhalation capsule    Sig: Place 1 capsule (18 mcg total) into inhaler and inhale daily.  Dispense:  30 capsule    Refill:  6  . cetirizine (ZYRTEC) 10 MG tablet    Sig: Take 1 tablet (10 mg total) by mouth daily.    Dispense:  30 tablet    Refill:  1    Follow-up: Return in about 3 months (around 06/10/2017) for Follow-up on COPD.   Arnoldo Morale MD

## 2017-03-12 ENCOUNTER — Other Ambulatory Visit: Payer: Self-pay

## 2017-03-12 DIAGNOSIS — J449 Chronic obstructive pulmonary disease, unspecified: Secondary | ICD-10-CM

## 2017-03-12 MED ORDER — TIOTROPIUM BROMIDE MONOHYDRATE 1.25 MCG/ACT IN AERS: 1.2500 ug | INHALATION_SPRAY | Freq: Every day | RESPIRATORY_TRACT | 6 refills | Status: DC

## 2017-03-12 MED FILL — IPRATROPIUM BR 0.02% SOLN: 0.02 | 6 days supply | Qty: 3 | Fill #0

## 2017-03-12 MED FILL — **SPIRIVA RESPIMAT 1.25 MCG: 1.25 MCG | 30 days supply | Qty: 2 | Fill #0

## 2017-03-12 MED FILL — VENTOLIN HFA 90 MCG INHALER: 108 (90 BAS | 25 days supply | Qty: 18 | Fill #0

## 2017-03-29 MED FILL — $VENTOLIN HFA 18G INHALER: 108 (90 BAS | 16 days supply | Qty: 18 | Fill #3

## 2017-04-02 MED FILL — $Symbicort 160-4.5mcg/act: 160-4.5 | 30 days supply | Qty: 1 | Fill #0

## 2017-04-10 MED FILL — AMLODIPINE BESYLATE 10 MG T: 10 | 30 days supply | Qty: 30 | Fill #1

## 2017-04-10 MED FILL — ?CETIRIZINE HCL 10 MG TABLE: 10 | 30 days supply | Qty: 30 | Fill #1

## 2017-04-11 ENCOUNTER — Encounter: Payer: Self-pay | Admitting: Family Medicine

## 2017-04-17 MED FILL — $VENTOLIN HFA 18G INHALER: 108 (90 BAS | 25 days supply | Qty: 18 | Fill #3

## 2017-05-07 MED FILL — AMLODIPINE BESYLATE 10 MG T: 10 | 30 days supply | Qty: 30 | Fill #2

## 2017-05-07 MED FILL — $Symbicort 160-4.5mcg/act: 160-4.5 | 30 days supply | Qty: 1 | Fill #1

## 2017-05-07 MED FILL — $VENTOLIN HFA 18G INHALER: 108 (90 BAS | 25 days supply | Qty: 18 | Fill #1

## 2017-05-27 MED FILL — !VENTOLIN HFA INHALER: 108 (90 BAS | 25 days supply | Qty: 18 | Fill #4

## 2017-06-10 MED FILL — AMLODIPINE BESYLATE 10 MG T: 10 | 30 days supply | Qty: 30 | Fill #3

## 2017-06-13 MED FILL — !VENTOLIN HFA INHALER: 108 (90 BAS | 25 days supply | Qty: 18 | Fill #2

## 2017-06-17 ENCOUNTER — Ambulatory Visit: Payer: Self-pay | Attending: Family Medicine | Admitting: Family Medicine

## 2017-06-17 ENCOUNTER — Encounter: Payer: Self-pay | Admitting: Family Medicine

## 2017-06-17 VITALS — BP 154/95 | HR 82 | Temp 98.5°F | Resp 20 | Wt 125.6 lb

## 2017-06-17 DIAGNOSIS — Z720 Tobacco use: Secondary | ICD-10-CM

## 2017-06-17 DIAGNOSIS — I10 Essential (primary) hypertension: Secondary | ICD-10-CM

## 2017-06-17 DIAGNOSIS — J449 Chronic obstructive pulmonary disease, unspecified: Secondary | ICD-10-CM | POA: Insufficient documentation

## 2017-06-17 DIAGNOSIS — F17210 Nicotine dependence, cigarettes, uncomplicated: Secondary | ICD-10-CM | POA: Insufficient documentation

## 2017-06-17 DIAGNOSIS — Z131 Encounter for screening for diabetes mellitus: Secondary | ICD-10-CM

## 2017-06-17 DIAGNOSIS — J302 Other seasonal allergic rhinitis: Secondary | ICD-10-CM | POA: Insufficient documentation

## 2017-06-17 DIAGNOSIS — Z79899 Other long term (current) drug therapy: Secondary | ICD-10-CM | POA: Insufficient documentation

## 2017-06-17 DIAGNOSIS — J3089 Other allergic rhinitis: Secondary | ICD-10-CM

## 2017-06-17 DIAGNOSIS — J4489 Other specified chronic obstructive pulmonary disease: Secondary | ICD-10-CM

## 2017-06-17 LAB — POCT GLYCOSYLATED HEMOGLOBIN (HGB A1C): HEMOGLOBIN A1C: 5.4

## 2017-06-17 MED ORDER — MONTELUKAST SODIUM 10 MG PO TABS: 10.0000 mg | ORAL_TABLET | Freq: Every day | ORAL | 3 refills | Status: DC

## 2017-06-17 MED ORDER — AMLODIPINE BESYLATE 10 MG PO TABS: 10.0000 mg | ORAL_TABLET | Freq: Every day | ORAL | 6 refills | Status: DC

## 2017-06-17 MED ORDER — FLUTICASONE PROPIONATE 50 MCG/ACT NA SUSP: 2.0000 | Freq: Every day | NASAL | 6 refills | Status: DC

## 2017-06-17 MED ORDER — LISINOPRIL 5 MG PO TABS: 5.0000 mg | ORAL_TABLET | Freq: Every day | ORAL | 6 refills | Status: DC

## 2017-06-17 MED ORDER — CETIRIZINE HCL 10 MG PO TABS: 10.0000 mg | ORAL_TABLET | Freq: Every day | ORAL | 1 refills | Status: DC

## 2017-06-17 MED ORDER — TIOTROPIUM BROMIDE MONOHYDRATE 1.25 MCG/ACT IN AERS: 1.2500 ug | INHALATION_SPRAY | Freq: Every day | RESPIRATORY_TRACT | 6 refills | Status: DC

## 2017-06-17 MED ORDER — ALBUTEROL SULFATE HFA 108 (90 BASE) MCG/ACT IN AERS: 2.0000 | INHALATION_SPRAY | RESPIRATORY_TRACT | 6 refills | Status: DC | PRN

## 2017-06-17 MED ORDER — BUDESONIDE-FORMOTEROL FUMARATE 160-4.5 MCG/ACT IN AERO: 2.0000 | INHALATION_SPRAY | Freq: Two times a day (BID) | RESPIRATORY_TRACT | 3 refills | Status: DC

## 2017-06-17 MED FILL — FLUTICASONE PROP 50 MCG SPR: 50 | 30 days supply | Qty: 16 | Fill #0

## 2017-06-17 MED FILL — **SPIRIVA RESPIMAT 1.25 MCG: 1.25 MCG | 14 days supply | Qty: 4 | Fill #0

## 2017-06-17 MED FILL — MONTELUKAST SOD 10 MG TAB: 10 | 30 days supply | Qty: 30 | Fill #0

## 2017-06-17 MED FILL — ?LISINOPRIL 5 MG TABLET: 5 | 30 days supply | Qty: 30 | Fill #0

## 2017-06-17 MED FILL — $Symbicort 160-4.5mcg/act: 160-4.5 | 30 days supply | Qty: 1 | Fill #0

## 2017-06-17 MED FILL — ?CETIRIZINE HCL 10 MG TABLE: 10 | 30 days supply | Qty: 30 | Fill #0

## 2017-06-17 NOTE — Progress Notes (Signed)
Pt here to f/u with COPD Cough, nasal and ear congestion

## 2017-06-17 NOTE — Progress Notes (Signed)
Subjective:  Patient ID: Tanya Jordan, female    DOB: Jun 14, 1966  Age: 51 y.o. MRN: [id number]  CC: Follow-up visit  HPI Tanya Jordan is 51 year old female with a history of COPD, Hypertension who presents today for a follow-up.  Blood pressure is elevated today and she has been compliant with her antihypertensives; she attributes this to just getting off work. Not compliant with exercise and low-sodium diet.  She has been compliant with her Symbicort, albuterol but not Spiriva and denies any COPD exacerbations.  She complains of allergy symptoms are not controlled on Flonase and Zyrtec as she has persisting coughing, sneezing which wakes her up at night and associated right ear itch. She sometimes has to take Alka-Seltzer OTC.  She has cut back on her smoking and now smokes 3 cigarettes per day.  Past Medical History:  Diagnosis Date  . COPD (chronic obstructive pulmonary disease) (HCC)     No past surgical history on file.  No Known Allergies   Outpatient Medications Prior to Visit  Medication Sig Dispense Refill  . albuterol (PROVENTIL) (2.5 MG/3ML) 0.083% nebulizer solution Take 3 mLs (2.5 mg total) by nebulization every 6 (six) hours as needed for wheezing or shortness of breath. 75 mL 6  . ipratropium (ATROVENT) 0.02 % nebulizer solution Take 2.5 mLs (0.5 mg total) by nebulization 4 (four) times daily. 75 mL 5  . albuterol (PROVENTIL HFA;VENTOLIN HFA) 108 (90 Base) MCG/ACT inhaler Inhale 2 puffs into the lungs every 4 (four) hours as needed for wheezing or shortness of breath. 18 g 6  . amLODipine (NORVASC) 10 MG tablet Take 1 tablet (10 mg total) by mouth daily. 30 tablet 6  . budesonide-formoterol (SYMBICORT) 160-4.5 MCG/ACT inhaler Inhale 2 puffs into the lungs 2 (two) times daily. 3 Inhaler 3  . fluticasone (FLONASE) 50 MCG/ACT nasal spray Place 2 sprays into both nostrils daily. 16 g 6  . nicotine (NICODERM CQ) 21 mg/24hr patch Place 1 patch (21 mg total) onto the  skin daily. (Patient not taking: Reported on 12/14/2016) 28 patch 0  . cetirizine (ZYRTEC) 10 MG tablet Take 1 tablet (10 mg total) by mouth daily. (Patient not taking: Reported on 06/17/2017) 30 tablet 1  . Tiotropium Bromide Monohydrate (SPIRIVA RESPIMAT) 1.25 MCG/ACT AERS Inhale 1.25 mcg into the lungs daily. 2 puffs daily (Patient not taking: Reported on 06/17/2017) 1 Inhaler 6   No facility-administered medications prior to visit.     ROS Review of Systems  Constitutional: Negative for activity change, appetite change and fatigue.  HENT: Negative for congestion, sinus pressure and sore throat.   Eyes: Negative for visual disturbance.  Respiratory: Negative for cough, chest tightness, shortness of breath and wheezing.   Cardiovascular: Negative for chest pain and palpitations.  Gastrointestinal: Negative for abdominal distention, abdominal pain and constipation.  Endocrine: Negative for polydipsia.  Genitourinary: Negative for dysuria and frequency.  Musculoskeletal: Negative for arthralgias and back pain.  Skin: Negative for rash.  Neurological: Negative for tremors, light-headedness and numbness.  Hematological: Does not bruise/bleed easily.  Psychiatric/Behavioral: Negative for agitation and behavioral problems.    Objective:  BP (!) 154/95 (BP Location: Left Arm, Patient Position: Sitting, Cuff Size: Normal)   Pulse 82   Temp 98.5 F (36.9 C) (Oral)   Resp 20   Wt 125 lb 9.6 oz (57 kg)   LMP 03/29/2011   SpO2 95%   BMI 21.56 kg/m   BP/Weight 06/17/2017 03/11/2017 6/96/2952  Systolic BP 841 324 401  Diastolic  BP 95 88 81  Wt. (Lbs) 125.6 128.8 131.6  BMI 21.56 22.11 22.59      Physical Exam  Constitutional: She is oriented to person, place, and time. She appears well-developed and well-nourished.  HENT:  Mouth/Throat: Oropharynx is clear and moist.  Slight cerumen in right eustachian tube Left ear is normal.  Cardiovascular: Normal rate, normal heart sounds and  intact distal pulses.   No murmur heard. Pulmonary/Chest: Effort normal and breath sounds normal. She has no wheezes. She has no rales. She exhibits no tenderness.  Abdominal: Soft. Bowel sounds are normal. She exhibits no distension and no mass. There is no tenderness.  Musculoskeletal: Normal range of motion.  Neurological: She is alert and oriented to person, place, and time.  Skin: Skin is warm and dry.  Psychiatric: She has a normal mood and affect.     CMP Latest Ref Rng & Units 11/16/2016 01/15/2016 01/14/2016  Glucose 65 - 99 mg/dL 105(H) 170(H) 146(H)  BUN 7 - 25 mg/dL 10 13 11   Creatinine 0.50 - 1.05 mg/dL 0.82 0.80 0.90  Sodium 135 - 146 mmol/L 144 140 145  Potassium 3.5 - 5.3 mmol/L 4.1 3.7 3.9  Chloride 98 - 110 mmol/L 108 105 108  CO2 20 - 31 mmol/L 26 23 24   Calcium 8.6 - 10.4 mg/dL 9.6 9.3 9.5  Total Protein 6.1 - 8.1 g/dL 7.1 - -  Total Bilirubin 0.2 - 1.2 mg/dL 0.4 - -  Alkaline Phos 33 - 130 U/L 71 - -  AST 10 - 35 U/L 17 - -  ALT 6 - 29 U/L 16 - -    Lipid Panel     Component Value Date/Time   CHOL 187 11/16/2016 0904   TRIG 80 11/16/2016 0904   HDL 67 11/16/2016 0904   CHOLHDL 2.8 11/16/2016 0904   VLDL 16 11/16/2016 0904   LDLCALC 104 (H) 11/16/2016 0904    Lab Results  Component Value Date   HGBA1C 5.4 06/17/2017    Assessment & Plan:   1. Screening for diabetes mellitus A1c of 5.4 - HgB A1c  2. COPD with chronic bronchitis ([place]) No acute exacerbation Advised the smoking cessation will return progression of condition She brings in a handicap form for completion - I have explained to her that she does not meet the criteria for handicap based on her COPD. - Tiotropium Bromide Monohydrate (SPIRIVA RESPIMAT) 1.25 MCG/ACT AERS; Inhale 1.25 mcg into the lungs daily. 2 puffs daily  Dispense: 1 Inhaler; Refill: 6 - budesonide-formoterol (SYMBICORT) 160-4.5 MCG/ACT inhaler; Inhale 2 puffs into the lungs 2 (two) times daily.  Dispense: 3 Inhaler;  Refill: 3 - albuterol (PROVENTIL HFA;VENTOLIN HFA) 108 (90 Base) MCG/ACT inhaler; Inhale 2 puffs into the lungs every 4 (four) hours as needed for wheezing or shortness of breath.  Dispense: 18 g; Refill: 6 - Basic metabolic panel  3. Essential hypertension Uncontrolled Lisinopril added to her regimen; advised to take antihypertensives in the morning Low-sodium diet - amLODipine (NORVASC) 10 MG tablet; Take 1 tablet (10 mg total) by mouth daily.  Dispense: 30 tablet; Refill: 6 - lisinopril (PRINIVIL,ZESTRIL) 5 MG tablet; Take 1 tablet (5 mg total) by mouth daily.  Dispense: 30 tablet; Refill: 6  4. Tobacco abuse Spent 3 minutes counseling on cessation and hazardous effect She is working on cutting back  5. Seasonal allergic rhinitis due to other allergic trigger Uncontrolled Emphasize compliance with Zyrtec and Flonase Singulair added to her regimen - cetirizine (ZYRTEC) 10 MG tablet;  Take 1 tablet (10 mg total) by mouth daily.  Dispense: 30 tablet; Refill: 1 - fluticasone (FLONASE) 50 MCG/ACT nasal spray; Place 2 sprays into both nostrils daily.  Dispense: 16 g; Refill: 6 - montelukast (SINGULAIR) 10 MG tablet; Take 1 tablet (10 mg total) by mouth at bedtime.  Dispense: 30 tablet; Refill: 3   Meds ordered this encounter  Medications  . Tiotropium Bromide Monohydrate (SPIRIVA RESPIMAT) 1.25 MCG/ACT AERS    Sig: Inhale 1.25 mcg into the lungs daily. 2 puffs daily    Dispense:  1 Inhaler    Refill:  6  . amLODipine (NORVASC) 10 MG tablet    Sig: Take 1 tablet (10 mg total) by mouth daily.    Dispense:  30 tablet    Refill:  6  . cetirizine (ZYRTEC) 10 MG tablet    Sig: Take 1 tablet (10 mg total) by mouth daily.    Dispense:  30 tablet    Refill:  1  . budesonide-formoterol (SYMBICORT) 160-4.5 MCG/ACT inhaler    Sig: Inhale 2 puffs into the lungs 2 (two) times daily.    Dispense:  3 Inhaler    Refill:  3  . albuterol (PROVENTIL HFA;VENTOLIN HFA) 108 (90 Base) MCG/ACT inhaler      Sig: Inhale 2 puffs into the lungs every 4 (four) hours as needed for wheezing or shortness of breath.    Dispense:  18 g    Refill:  6  . fluticasone (FLONASE) 50 MCG/ACT nasal spray    Sig: Place 2 sprays into both nostrils daily.    Dispense:  16 g    Refill:  6  . montelukast (SINGULAIR) 10 MG tablet    Sig: Take 1 tablet (10 mg total) by mouth at bedtime.    Dispense:  30 tablet    Refill:  3  . lisinopril (PRINIVIL,ZESTRIL) 5 MG tablet    Sig: Take 1 tablet (5 mg total) by mouth daily.    Dispense:  30 tablet    Refill:  6    Follow-up: Return in about 3 weeks (around 07/08/2017) for complete physical exam.   Arnoldo Morale MD

## 2017-06-19 LAB — BASIC METABOLIC PANEL
BUN / CREAT RATIO: 7 — AB (ref 9–23)
BUN: 6 mg/dL (ref 6–24)
CO2: 18 mmol/L — AB (ref 20–29)
CREATININE: 0.86 mg/dL (ref 0.57–1.00)
Calcium: 9.4 mg/dL (ref 8.7–10.2)
Chloride: 103 mmol/L (ref 96–106)
GFR calc Af Amer: 90 mL/min/{1.73_m2} (ref >59)
GFR calc non Af Amer: 78 mL/min/{1.73_m2} (ref >59)
GLUCOSE: 70 mg/dL (ref 65–99)
Potassium: 3.6 mmol/L (ref 3.5–5.2)
SODIUM: 141 mmol/L (ref 134–144)

## 2017-06-21 ENCOUNTER — Telehealth: Payer: Self-pay | Admitting: Family Medicine

## 2017-06-21 NOTE — Telephone Encounter (Signed)
Pt aware results.  Name and DOB verified

## 2017-06-21 NOTE — Telephone Encounter (Signed)
Pt called back to review results.Marland KitchenMarland KitchenMarland Kitchen

## 2017-06-26 ENCOUNTER — Other Ambulatory Visit: Payer: Self-pay | Admitting: Family Medicine

## 2017-06-26 DIAGNOSIS — J449 Chronic obstructive pulmonary disease, unspecified: Secondary | ICD-10-CM

## 2017-06-26 MED FILL — !VENTOLIN HFA INHALER: 108 (90 BAS | 25 days supply | Qty: 18 | Fill #5

## 2017-07-10 ENCOUNTER — Ambulatory Visit: Payer: Self-pay | Attending: Family Medicine | Admitting: Family Medicine

## 2017-07-10 VITALS — BP 133/81 | HR 82 | Temp 98.1°F | Ht 64.0 in | Wt 127.2 lb

## 2017-07-10 DIAGNOSIS — Z7951 Long term (current) use of inhaled steroids: Secondary | ICD-10-CM | POA: Insufficient documentation

## 2017-07-10 DIAGNOSIS — J449 Chronic obstructive pulmonary disease, unspecified: Secondary | ICD-10-CM | POA: Insufficient documentation

## 2017-07-10 DIAGNOSIS — Z01419 Encounter for gynecological examination (general) (routine) without abnormal findings: Secondary | ICD-10-CM | POA: Insufficient documentation

## 2017-07-10 DIAGNOSIS — Z79899 Other long term (current) drug therapy: Secondary | ICD-10-CM | POA: Insufficient documentation

## 2017-07-10 DIAGNOSIS — Z1231 Encounter for screening mammogram for malignant neoplasm of breast: Secondary | ICD-10-CM

## 2017-07-10 DIAGNOSIS — L304 Erythema intertrigo: Secondary | ICD-10-CM

## 2017-07-10 DIAGNOSIS — Z124 Encounter for screening for malignant neoplasm of cervix: Secondary | ICD-10-CM

## 2017-07-10 DIAGNOSIS — Z1239 Encounter for other screening for malignant neoplasm of breast: Secondary | ICD-10-CM

## 2017-07-10 DIAGNOSIS — Z1211 Encounter for screening for malignant neoplasm of colon: Secondary | ICD-10-CM

## 2017-07-10 DIAGNOSIS — Z0000 Encounter for general adult medical examination without abnormal findings: Secondary | ICD-10-CM

## 2017-07-11 ENCOUNTER — Encounter: Payer: Self-pay | Admitting: Family Medicine

## 2017-07-11 MED ORDER — MUPIROCIN CALCIUM 2 % EX CREA: 1.0000 "application " | TOPICAL_CREAM | Freq: Two times a day (BID) | CUTANEOUS | 1 refills | Status: DC

## 2017-07-11 MED FILL — MUPIROCIN 2% CREAM: 2 | 15 days supply | Qty: 30 | Fill #0

## 2017-07-11 NOTE — Addendum Note (Signed)
Addended by: Arnoldo Morale on: 07/11/2017 12:39 PM   Modules accepted: Orders

## 2017-07-11 NOTE — Progress Notes (Addendum)
Subjective:  Patient ID: Tanya Jordan, female    DOB: [birth year]  Age: 51 y.o. MRN: [id number]  CC: Annual Exam and Gynecologic Exam   HPI Tanya Jordan presents today for a complete physical exam.  Past Medical History:  Diagnosis Date  . COPD (chronic obstructive pulmonary disease) (HCC)     No past surgical history on file.  No Known Allergies   Outpatient Medications Prior to Visit  Medication Sig Dispense Refill  . albuterol (PROVENTIL HFA;VENTOLIN HFA) 108 (90 Base) MCG/ACT inhaler Inhale 2 puffs into the lungs every 4 (four) hours as needed for wheezing or shortness of breath. 18 g 6  . albuterol (PROVENTIL) (2.5 MG/3ML) 0.083% nebulizer solution Take 3 mLs (2.5 mg total) by nebulization every 6 (six) hours as needed for wheezing or shortness of breath. 75 mL 6  . amLODipine (NORVASC) 10 MG tablet Take 1 tablet (10 mg total) by mouth daily. 30 tablet 6  . budesonide-formoterol (SYMBICORT) 160-4.5 MCG/ACT inhaler Inhale 2 puffs into the lungs 2 (two) times daily. 3 Inhaler 3  . cetirizine (ZYRTEC) 10 MG tablet Take 1 tablet (10 mg total) by mouth daily. 30 tablet 1  . fluticasone (FLONASE) 50 MCG/ACT nasal spray Place 2 sprays into both nostrils daily. 16 g 6  . ipratropium (ATROVENT) 0.02 % nebulizer solution Take 2.5 mLs (0.5 mg total) by nebulization 4 (four) times daily. 75 mL 5  . montelukast (SINGULAIR) 10 MG tablet Take 1 tablet (10 mg total) by mouth at bedtime. 30 tablet 3  . Tiotropium Bromide Monohydrate (SPIRIVA RESPIMAT) 1.25 MCG/ACT AERS Inhale 1.25 mcg into the lungs daily. 2 puffs daily 1 Inhaler 6  . VENTOLIN HFA 108 (90 Base) MCG/ACT inhaler INHALE 2 PUFFS INTO THE LUNGS EVERY 4 (FOUR) HOURS AS NEEDED FOR WHEEZING OR SHORTNESS OF BREATH. 18 g 1  . lisinopril (PRINIVIL,ZESTRIL) 5 MG tablet Take 1 tablet (5 mg total) by mouth daily. (Patient not taking: Reported on 07/11/2017) 30 tablet 6  . nicotine (NICODERM CQ) 21 mg/24hr patch Place 1 patch (21 mg total)  onto the skin daily. (Patient not taking: Reported on 12/14/2016) 28 patch 0   No facility-administered medications prior to visit.     ROS Review of Systems  Constitutional: Negative for activity change, appetite change and fatigue.  HENT: Negative for congestion, sinus pressure and sore throat.   Eyes: Negative for visual disturbance.  Respiratory: Negative for cough, chest tightness, shortness of breath and wheezing.   Cardiovascular: Negative for chest pain and palpitations.  Gastrointestinal: Negative for abdominal distention, abdominal pain and constipation.  Endocrine: Negative for polydipsia.  Genitourinary: Negative for dysuria and frequency.  Musculoskeletal: Negative for arthralgias and back pain.  Skin: Negative for rash.  Neurological: Negative for tremors, light-headedness and numbness.  Hematological: Does not bruise/bleed easily.  Psychiatric/Behavioral: Negative for agitation and behavioral problems.    Objective:  BP 133/81   Pulse 82   Temp 98.1 F (36.7 C) (Oral)   Ht 5\' 4"  (1.626 m)   Wt 127 lb 3.2 oz (57.7 kg)   LMP 03/29/2011   SpO2 96%   BMI 21.83 kg/m   BP/Weight 07/10/2017 7/37/1062 05/11/4853  Systolic BP 627 035 009  Diastolic BP 81 95 88  Wt. (Lbs) 127.2 125.6 128.8  BMI 21.83 21.56 22.11      Physical Exam  Constitutional: She is oriented to person, place, and time. She appears well-developed and well-nourished. No distress.  HENT:  Head: Normocephalic.  Right Ear:  External ear normal.  Left Ear: External ear normal.  Nose: Nose normal.  Mouth/Throat: Oropharynx is clear and moist.  Eyes: Pupils are equal, round, and reactive to light. Conjunctivae and EOM are normal.  Neck: Normal range of motion. No JVD present.  Cardiovascular: Normal rate, regular rhythm, normal heart sounds and intact distal pulses.  Exam reveals no gallop.   No murmur heard. Pulmonary/Chest: Effort normal and breath sounds normal. No respiratory distress. She has  no wheezes. She has no rales. She exhibits no tenderness. Right breast exhibits no mass and no tenderness. Left breast exhibits no mass and no tenderness.  Abdominal: Soft. Bowel sounds are normal. She exhibits no distension and no mass. There is no tenderness.  Genitourinary:  Genitourinary Comments: External genitalia, vagina, cervix, adnexa - normal  Musculoskeletal: Normal range of motion. She exhibits no edema or tenderness.  Neurological: She is alert and oriented to person, place, and time. She has normal reflexes.  Skin: Skin is warm and dry. Rash (intertrigo rash on lower aspect of right ear) noted. She is not diaphoretic.  Psychiatric: She has a normal mood and affect.     Assessment & Plan:   1. Annual physical exam  2. Screening for cervical cancer - Cytology - PAP [place]  3. Screening for breast cancer - MM Digital Screening; Future  4. Screening for colon cancer - Ambulatory referral to Gastroenterology  5. Intertrigo Placed on topical antibiotic No orders of the defined types were placed in this encounter.   Follow-up: Return for Follow-up of chronic medical condition; please call to obtain an appointment in  3 months.   Arnoldo Morale MD

## 2017-07-15 ENCOUNTER — Telehealth: Payer: Self-pay

## 2017-07-15 LAB — CYTOLOGY - PAP
Diagnosis: NEGATIVE
HPV: NOT DETECTED

## 2017-07-15 MED FILL — AMLODIPINE BESYLATE 10 MG T: 10 | 30 days supply | Qty: 30 | Fill #4

## 2017-07-15 NOTE — Telephone Encounter (Signed)
Pt was called and informed of lab results. 

## 2017-07-16 ENCOUNTER — Encounter: Payer: Self-pay | Admitting: Gastroenterology

## 2017-07-16 MED FILL — !VENTOLIN HFA INHALER: 108 (90 BAS | 16 days supply | Qty: 18 | Fill #0

## 2017-07-18 ENCOUNTER — Other Ambulatory Visit: Payer: Self-pay | Admitting: Obstetrics and Gynecology

## 2017-07-18 DIAGNOSIS — Z1231 Encounter for screening mammogram for malignant neoplasm of breast: Secondary | ICD-10-CM

## 2017-07-24 ENCOUNTER — Other Ambulatory Visit: Payer: Self-pay | Admitting: Family Medicine

## 2017-07-24 DIAGNOSIS — J3089 Other allergic rhinitis: Secondary | ICD-10-CM

## 2017-07-24 MED FILL — ?CETIRIZINE HCL 10 MG TABLE: 10 | 30 days supply | Qty: 30 | Fill #1

## 2017-07-29 ENCOUNTER — Other Ambulatory Visit: Payer: Self-pay

## 2017-07-29 MED ORDER — UMECLIDINIUM BROMIDE 62.5 MCG/INH IN AEPB: 1.0000 | INHALATION_SPRAY | Freq: Every day | RESPIRATORY_TRACT | 3 refills | Status: DC

## 2017-08-14 MED FILL — AMLODIPINE BESYLATE 10 MG T: 10 | 30 days supply | Qty: 30 | Fill #5

## 2017-08-14 MED FILL — ?MONTELUKAST SOD 10MG TAB: 10 | 30 days supply | Qty: 30 | Fill #1

## 2017-08-15 ENCOUNTER — Encounter (HOSPITAL_COMMUNITY): Payer: Self-pay

## 2017-08-15 ENCOUNTER — Ambulatory Visit (HOSPITAL_COMMUNITY)
Admission: RE | Admit: 2017-08-15 | Discharge: 2017-08-15 | Disposition: A | Discharge status: Home / Self Care | Payer: Self-pay | Source: Ambulatory Visit | Attending: Obstetrics and Gynecology | Admitting: Obstetrics and Gynecology

## 2017-08-15 ENCOUNTER — Ambulatory Visit
Admission: RE | Admit: 2017-08-15 | Discharge: 2017-08-15 | Disposition: A | Discharge status: Home / Self Care | Payer: No Typology Code available for payment source | Source: Ambulatory Visit | Attending: Obstetrics and Gynecology | Admitting: Obstetrics and Gynecology

## 2017-08-15 VITALS — BP 120/82 | HR 83 | Temp 98.2°F | Ht 65.0 in | Wt 127.6 lb

## 2017-08-15 DIAGNOSIS — Z1239 Encounter for other screening for malignant neoplasm of breast: Secondary | ICD-10-CM

## 2017-08-15 DIAGNOSIS — Z1231 Encounter for screening mammogram for malignant neoplasm of breast: Secondary | ICD-10-CM

## 2017-08-15 NOTE — Patient Instructions (Addendum)
Explained breast self awareness with [name]. Patient did not need a Pap smear today due to last Pap smear and HPV was 07/11/2017. Let her know BCCCP will cover Pap smears and HPV Typing every 5 years unless has a history of abnormal Pap smears. Referred patient to the [facility] for a screening mammogram. Appointment scheduled for Thursday, August 15, 2017 at 1540. Let [facility] know the [facility] will follow up with her within the next couple weeks with results of mammogram by letter or phone. Discussed smoking cessation with patient. Referred patient to the [facility] Quitline and gave resources to the free smoking cessation classes at [facility]. Tanya Jordan verbalized understanding.  Tanya Jordan, Arvil [place], RN 3:22 PM

## 2017-08-15 NOTE — Progress Notes (Signed)
No complaints today.   Pap Smear: Pap smear not completed today. Last Pap smear was 07/11/2017 at [facility] and normal with negative HPV. Per patient has no history of an abnormal Pap smear. Last Pap smear result is in EPIC.  Physical exam: Breasts Breasts symmetrical. No skin abnormalities bilateral breasts. No nipple retraction bilateral breasts. No nipple discharge bilateral breasts. No lymphadenopathy. No lumps palpated bilateral breasts. No complaints of pain or tenderness on exam. Referred patient to the [facility] for a screening mammogram. Appointment scheduled for Thursday, August 15, 2017 at 1540.        Pelvic/Bimanual No Pap smear completed today since last Pap smear and HPV typing was 07/11/2017. Pap smear not indicated per BCCCP guidelines.   Smoking History: Patient is a current smoker. Discussed smoking cessation with patient. Referred patient to the [facility] Quitline and gave resources to the free smoking cessation classes at [facility].  Patient Navigation: Patient education provided. Access to services provided for patient through [facility] program.   Colorectal Cancer Screening: Patient has a had colonoscopy scheduled for Friday, September 13, 2017 at 0830 at [place]. Patient aware of appointment and will be there. No complaints today.

## 2017-08-16 MED FILL — !VENTOLIN HFA INHALER: 108 (90 BAS | 16 days supply | Qty: 18 | Fill #1

## 2017-08-16 MED FILL — $INCRUSE ELLIPTA 62.5 MCG I: 62.5 MCG | 30 days supply | Qty: 30 | Fill #0

## 2017-08-19 MED FILL — $Symbicort 160-4.5mcg/act: 160-4.5 | 30 days supply | Qty: 1 | Fill #1

## 2017-08-26 ENCOUNTER — Other Ambulatory Visit: Payer: Self-pay | Admitting: Family Medicine

## 2017-08-26 DIAGNOSIS — J3089 Other allergic rhinitis: Secondary | ICD-10-CM

## 2017-08-26 MED FILL — ?CETIRIZINE HCL 10 MG TABLE: 10 | 30 days supply | Qty: 30 | Fill #0

## 2017-08-28 ENCOUNTER — Ambulatory Visit (AMBULATORY_SURGERY_CENTER): Payer: Self-pay | Admitting: *Deleted

## 2017-08-28 VITALS — Ht 64.0 in | Wt 129.0 lb

## 2017-08-28 DIAGNOSIS — Z1211 Encounter for screening for malignant neoplasm of colon: Secondary | ICD-10-CM

## 2017-08-28 MED ORDER — NA SULFATE-K SULFATE-MG SULF 17.5-3.13-1.6 GM/177ML PO SOLN: 1.0000 | Freq: Once | ORAL | 0 refills | Status: AC

## 2017-08-28 NOTE — Progress Notes (Signed)
Patient denies any allergies to eggs or soy. Patient denies any problems with anesthesia/sedation. Patient denies any oxygen use at home and does not take any diet/weight loss medications. EMMI education assisgned to patient on colonoscopy, this was explained and instructions given to patient. Suprep sample kit given to patient.

## 2017-09-13 ENCOUNTER — Ambulatory Visit (AMBULATORY_SURGERY_CENTER): Payer: Self-pay | Admitting: Gastroenterology

## 2017-09-13 ENCOUNTER — Encounter: Payer: Self-pay | Admitting: Gastroenterology

## 2017-09-13 VITALS — BP 143/85 | HR 67 | Temp 97.5°F | Resp 21

## 2017-09-13 DIAGNOSIS — D126 Benign neoplasm of colon, unspecified: Secondary | ICD-10-CM

## 2017-09-13 DIAGNOSIS — K635 Polyp of colon: Secondary | ICD-10-CM

## 2017-09-13 DIAGNOSIS — Z1211 Encounter for screening for malignant neoplasm of colon: Secondary | ICD-10-CM

## 2017-09-13 DIAGNOSIS — Z1212 Encounter for screening for malignant neoplasm of rectum: Secondary | ICD-10-CM

## 2017-09-13 MED ORDER — SODIUM CHLORIDE 0.9 % IV SOLN: 500.0000 mL | INTRAVENOUS | Status: DC

## 2017-09-13 MED FILL — AMLODIPINE BESYLATE 10 MG T: 10 | 30 days supply | Qty: 30 | Fill #6

## 2017-09-13 MED FILL — !VENTOLIN HFA INHALER: 108 (90 BAS | 16 days supply | Qty: 18 | Fill #2

## 2017-09-13 MED FILL — ?MONTELUKAST SOD 10MG TAB: 10 | 30 days supply | Qty: 30 | Fill #2

## 2017-09-13 NOTE — Op Note (Signed)
[facility] Patient Name: Tanya Jordan Procedure Date: 09/13/2017 8:26 AM MRN: [id number] Endoscopist: Milus Banister , MD Age: 51 Referring MD:  Date of Birth: [birth year] Gender: Female Account #: [id number] Procedure:                Colonoscopy Indications:              Screening for colorectal malignant neoplasm Medicines:                Monitored Anesthesia Care Procedure:                Pre-Anesthesia Assessment:                           - Prior to the procedure, a History and Physical                            was performed, and patient medications and                            allergies were reviewed. The patient's tolerance of                            previous anesthesia was also reviewed. The risks                            and benefits of the procedure and the sedation                            options and risks were discussed with the patient.                            All questions were answered, and informed consent                            was obtained. Prior Anticoagulants: The patient has                            taken no previous anticoagulant or antiplatelet                            agents. ASA Grade Assessment: II - A patient with                            mild systemic disease. After reviewing the risks                            and benefits, the patient was deemed in                            satisfactory condition to undergo the procedure.                           After obtaining informed consent, the colonoscope  was passed under direct vision. Throughout the                            procedure, the patient's blood pressure, pulse, and                            oxygen saturations were monitored continuously. The                            Colonoscope was introduced through the anus and                            advanced to the the cecum, identified by                            appendiceal orifice and  ileocecal valve. The                            colonoscopy was performed without difficulty. The                            patient tolerated the procedure well. The quality                            of the bowel preparation was good. The ileocecal                            valve, appendiceal orifice, and rectum were                            photographed. Scope In: 8:30:55 AM Scope Out: 8:44:53 AM Scope Withdrawal Time: 0 hours 8 minutes 59 seconds  Total Procedure Duration: 0 hours 13 minutes 58 seconds  Findings:                 There were 200-300 polyps inthe colon, from distal                            rectum to the cecum and in all segments in between.                            These ranged from 47mm to 34mm sessile polyps, 54mm                            to 2cm pedunculated polyps. The polyps were sampled                            with forecep biopsy.                           The exam was otherwise without abnormality on                            direct and retroflexion views. Complications:  No immediate complications. Estimated blood loss:                            None. Estimated Blood Loss:     Estimated blood loss: none. Impression:               - 200-300 polyps throughout the colon from distal                            rectum to the cecum, ranging in size from 75mm to                            3cm. None were obviously cancer. The polyps were                            sampled with biopsy. Recommendation:           - Patient has a contact number available for                            emergencies. The signs and symptoms of potential                            delayed complications were discussed with the                            patient. Return to normal activities tomorrow.                            Written discharge instructions were provided to the                            patient.                           - Resume previous diet.                            - Continue present medications.                           - Await pathology results.                           - Referral to [facility] to consider                            total colectomy for polyposis syndrome.                           - Referral to genetic councilor at [facility]                            for polyposis syndrome.                           -  EGD next available at [facility] to check for upper GI                            pathology. Milus Banister, MD 09/13/2017 8:55:34 AM This report has been signed electronically.

## 2017-09-13 NOTE — Progress Notes (Signed)
Called to room to assist during endoscopic procedure.  Patient ID and intended procedure confirmed with present staff. Received instructions for my participation in the procedure from the performing physician.  

## 2017-09-13 NOTE — Progress Notes (Signed)
Report to PACU, RN, vss, BBS= Clear.  

## 2017-09-13 NOTE — Patient Instructions (Addendum)
YOU HAD AN ENDOSCOPIC PROCEDURE TODAY AT [facility]:   Refer to the procedure report that was given to you for any specific questions about what was found during the examination.  If the procedure report does not answer your questions, please call your gastroenterologist to clarify.  If you requested that your care partner not be given the details of your procedure findings, then the procedure report has been included in a sealed envelope for you to review at your convenience later.  YOU SHOULD EXPECT: Some feelings of bloating in the abdomen. Passage of more gas than usual.  Walking can help get rid of the air that was put into your GI tract during the procedure and reduce the bloating. If you had a lower endoscopy (such as a colonoscopy or flexible sigmoidoscopy) you may notice spotting of blood in your stool or on the toilet paper. If you underwent a bowel prep for your procedure, you may not have a normal bowel movement for a few days.  Please Note:  You might notice some irritation and congestion in your nose or some drainage.  This is from the oxygen used during your procedure.  There is no need for concern and it should clear up in a day or so.  SYMPTOMS TO REPORT IMMEDIATELY:   Following lower endoscopy (colonoscopy or flexible sigmoidoscopy):  Excessive amounts of blood in the stool  Significant tenderness or worsening of abdominal pains  Swelling of the abdomen that is new, acute  Fever of 100F or higher  For urgent or emergent issues, a gastroenterologist can be reached at any hour by calling [phone].   DIET:  We do recommend a small meal at first, but then you may proceed to your regular diet.  Drink plenty of fluids but you should avoid alcoholic beverages for 24 hours.  MEDICATIONS: Continue present medications.  Please see handouts given to you by your recovery nurse.  Follow-up: Dr. Ardis Hughs will refer you to [facility] to consider total  colectomy for polyposis syndrome. He will also refer you to a genetic counselor at [facility] for polyposis syndrome. Dr. Ardis Hughs would also like you to schedule an EGD (upper endoscopy) at next available appointment here at [facility] to check for upper GI pathology. Dr. Ardis Hughs office will call you to set up these appointments. Message left with Patty to call you.  ACTIVITY:  You should plan to take it easy for the rest of today and you should NOT DRIVE or use heavy machinery until tomorrow (because of the sedation medicines used during the test).    FOLLOW UP: Our staff will call the number listed on your records the next business day following your procedure to check on you and address any questions or concerns that you may have regarding the information given to you following your procedure. If we do not reach you, we will leave a message.  However, if you are feeling well and you are not experiencing any problems, there is no need to return our call.  We will assume that you have returned to your regular daily activities without incident.  If any biopsies were taken you will be contacted by phone or by letter within the next 1-3 weeks.  Please call us at [phone] if you have not heard about the biopsies in 3 weeks.   Thank you for allowing [place] to provide for your healthcare needs today.  SIGNATURES/CONFIDENTIALITY: You and/or your care partner have  signed paperwork which will be entered into your electronic medical record.  These signatures attest to the fact that that the information above on your After Visit Summary has been reviewed and is understood.  Full responsibility of the confidentiality of this discharge information lies with you and/or your care-partner.

## 2017-09-13 NOTE — Progress Notes (Signed)
Pt's states no medical or surgical changes since previsit

## 2017-09-16 ENCOUNTER — Telehealth: Payer: Self-pay | Admitting: *Deleted

## 2017-09-16 ENCOUNTER — Other Ambulatory Visit: Payer: Self-pay

## 2017-09-16 ENCOUNTER — Telehealth: Payer: Self-pay

## 2017-09-16 DIAGNOSIS — D126 Benign neoplasm of colon, unspecified: Secondary | ICD-10-CM

## 2017-09-16 NOTE — Telephone Encounter (Signed)
Per colon report pt needs EGD Left message on machine to call back

## 2017-09-16 NOTE — Telephone Encounter (Signed)
  Follow up Call-  Call back number 09/13/2017 09/13/2017  Post procedure Call Back phone  # [phone] -  Permission to leave phone message Yes Yes  Some recent data might be hidden     Patient questions:  Message left to call if necessary.

## 2017-09-16 NOTE — Telephone Encounter (Signed)
  Follow up Call-  Call back number 09/13/2017 09/13/2017  Post procedure Call Back phone  # [phone] -  Permission to leave phone message Yes Yes  Some recent data might be hidden     Patient questions:  Message left to call us if necessary.  Second call.

## 2017-09-19 NOTE — Telephone Encounter (Signed)
Left message on machine to call back unable to reach pt letter mailed.

## 2017-10-08 MED FILL — !VENTOLIN HFA INHALER: 108 (90 BAS | 16 days supply | Qty: 18 | Fill #3

## 2017-10-08 MED FILL — $Symbicort 160-4.5mcg/act: 160-4.5 | 30 days supply | Qty: 1 | Fill #2

## 2017-10-08 MED FILL — ?CETIRIZINE HCL 10 MG TABLE: 10 | 30 days supply | Qty: 30 | Fill #1

## 2017-10-16 ENCOUNTER — Encounter: Payer: Self-pay | Admitting: Family Medicine

## 2017-10-16 ENCOUNTER — Ambulatory Visit: Payer: Self-pay | Attending: Family Medicine | Admitting: Family Medicine

## 2017-10-16 VITALS — BP 137/85 | HR 78 | Temp 97.6°F | Ht 64.0 in | Wt 128.6 lb

## 2017-10-16 DIAGNOSIS — Z720 Tobacco use: Secondary | ICD-10-CM

## 2017-10-16 DIAGNOSIS — F1010 Alcohol abuse, uncomplicated: Secondary | ICD-10-CM | POA: Insufficient documentation

## 2017-10-16 DIAGNOSIS — J3089 Other allergic rhinitis: Secondary | ICD-10-CM

## 2017-10-16 DIAGNOSIS — Z79899 Other long term (current) drug therapy: Secondary | ICD-10-CM | POA: Insufficient documentation

## 2017-10-16 DIAGNOSIS — J302 Other seasonal allergic rhinitis: Secondary | ICD-10-CM | POA: Insufficient documentation

## 2017-10-16 DIAGNOSIS — F17210 Nicotine dependence, cigarettes, uncomplicated: Secondary | ICD-10-CM | POA: Insufficient documentation

## 2017-10-16 DIAGNOSIS — I10 Essential (primary) hypertension: Secondary | ICD-10-CM | POA: Insufficient documentation

## 2017-10-16 DIAGNOSIS — J449 Chronic obstructive pulmonary disease, unspecified: Secondary | ICD-10-CM | POA: Insufficient documentation

## 2017-10-16 DIAGNOSIS — Z23 Encounter for immunization: Secondary | ICD-10-CM | POA: Insufficient documentation

## 2017-10-16 MED ORDER — ALBUTEROL SULFATE HFA 108 (90 BASE) MCG/ACT IN AERS: 2.0000 | INHALATION_SPRAY | RESPIRATORY_TRACT | 6 refills | Status: DC | PRN

## 2017-10-16 MED ORDER — UMECLIDINIUM BROMIDE 62.5 MCG/INH IN AEPB: 1.0000 | INHALATION_SPRAY | Freq: Every day | RESPIRATORY_TRACT | 3 refills | Status: DC

## 2017-10-16 MED ORDER — LISINOPRIL 5 MG PO TABS: 5.0000 mg | ORAL_TABLET | Freq: Every day | ORAL | 6 refills | Status: DC

## 2017-10-16 MED ORDER — AMLODIPINE BESYLATE 10 MG PO TABS: 10.0000 mg | ORAL_TABLET | Freq: Every day | ORAL | 6 refills | Status: DC

## 2017-10-16 MED ORDER — MONTELUKAST SODIUM 10 MG PO TABS: 10.0000 mg | ORAL_TABLET | Freq: Every day | ORAL | 3 refills | Status: DC

## 2017-10-16 MED ORDER — BUDESONIDE-FORMOTEROL FUMARATE 160-4.5 MCG/ACT IN AERO: 2.0000 | INHALATION_SPRAY | Freq: Two times a day (BID) | RESPIRATORY_TRACT | 3 refills | Status: DC

## 2017-10-16 MED FILL — ?LISINOPRIL 5 MG TABLET: 5 | 30 days supply | Qty: 30 | Fill #0

## 2017-10-16 MED FILL — AMLODIPINE BESYLATE 10 MG T: 10 | 30 days supply | Qty: 30 | Fill #0

## 2017-10-16 MED FILL — !VENTOLIN HFA INHALER: 108 (90 BAS | 16 days supply | Qty: 18 | Fill #0

## 2017-10-16 MED FILL — ?MONTELUKAST SOD 10MG TAB: 10 | 30 days supply | Qty: 30 | Fill #0

## 2017-10-16 MED FILL — $INCRUSE ELLIPTA 62.5 MCG I: 62.5 MCG | 30 days supply | Qty: 30 | Fill #0

## 2017-10-16 NOTE — Patient Instructions (Signed)
Steps to Quit Smoking Smoking tobacco can be bad for your health. It can also affect almost every organ in your body. Smoking puts you and people around you at risk for many serious long-lasting (chronic) diseases. Quitting smoking is hard, but it is one of the best things that you can do for your health. It is never too late to quit. What are the benefits of quitting smoking? When you quit smoking, you lower your risk for getting serious diseases and conditions. They can include:  Lung cancer or lung disease.  Heart disease.  Stroke.  Heart attack.  Not being able to have children (infertility).  Weak bones (osteoporosis) and broken bones (fractures).  If you have coughing, wheezing, and shortness of breath, those symptoms may get better when you quit. You may also get sick less often. If you are pregnant, quitting smoking can help to lower your chances of having a baby of low birth weight. What can I do to help me quit smoking? Talk with your doctor about what can help you quit smoking. Some things you can do (strategies) include:  Quitting smoking totally, instead of slowly cutting back how much you smoke over a period of time.  Going to in-person counseling. You are more likely to quit if you go to many counseling sessions.  Using resources and support systems, such as: ? Online chats with a counselor. ? Phone quitlines. ? Printed self-help materials. ? Support groups or group counseling. ? Text messaging programs. ? Mobile phone apps or applications.  Taking medicines. Some of these medicines may have nicotine in them. If you are pregnant or breastfeeding, do not take any medicines to quit smoking unless your doctor says it is okay. Talk with your doctor about counseling or other things that can help you.  Talk with your doctor about using more than one strategy at the same time, such as taking medicines while you are also going to in-person counseling. This can help make  quitting easier. What things can I do to make it easier to quit? Quitting smoking might feel very hard at first, but there is a lot that you can do to make it easier. Take these steps:  Talk to your family and friends. Ask them to support and encourage you.  Call phone quitlines, reach out to support groups, or work with a counselor.  Ask people who smoke to not smoke around you.  Avoid places that make you want (trigger) to smoke, such as: ? Bars. ? Parties. ? Smoke-break areas at work.  Spend time with people who do not smoke.  Lower the stress in your life. Stress can make you want to smoke. Try these things to help your stress: ? Getting regular exercise. ? Deep-breathing exercises. ? Yoga. ? Meditating. ? Doing a body scan. To do this, close your eyes, focus on one area of your body at a time from head to toe, and notice which parts of your body are tense. Try to relax the muscles in those areas.  Download or buy apps on your mobile phone or tablet that can help you stick to your quit plan. There are many free apps, such as QuitGuide from the CDC (Centers for Disease Control and Prevention). You can find more support from smokefree.gov and other websites.  This information is not intended to replace advice given to you by your health care provider. Make sure you discuss any questions you have with your health care provider. Document Released: 09/15/2009 Document   Revised: 07/17/2016 Document Reviewed: 04/05/2015 Elsevier Interactive Patient Education  2018 Elsevier Inc.  

## 2017-10-16 NOTE — Progress Notes (Signed)
Subjective:  Patient ID: Tanya Jordan, female    DOB: [birth year]  Age: 51 y.o. MRN: [id number]  CC: Hypertension   HPI Tanya Jordan  is 51 year old female with a history of COPD, alcohol abuse, seasonal allergies, hypertension who presents today for a follow-up.  She denies exacerbation of her COPD and has been compliant with her inhalers.  She has no shortness of breath, chest pain or wheezing but continues to smoke 1 pack of cigarettes a week.  Her seasonal allergies have been controlled and she has been using her Flonase and Zyrtec.  Tolerates her antihypertensive and denies any adverse effects from her medication.  She recently underwent a colonoscopy which revealed a precancerous adenomatous polyp and she is scheduled for an upper endoscopy in 12/2017. She has no additional concerns today.  Past Medical History:  Diagnosis Date  . Allergy   . COPD (chronic obstructive pulmonary disease) ([place])   . Hypertension     Past Surgical History:  Procedure Laterality Date  . REVISION OF SCAR ON FACE/HEAD      No Known Allergies   Outpatient Medications Prior to Visit  Medication Sig Dispense Refill  . cetirizine (ZYRTEC) 10 MG tablet TAKE 1 TABLET BY MOUTH DAILY. 30 tablet 1  . fluticasone (FLONASE) 50 MCG/ACT nasal spray Place 2 sprays into both nostrils daily. 16 g 6  . albuterol (PROVENTIL HFA;VENTOLIN HFA) 108 (90 Base) MCG/ACT inhaler Inhale 2 puffs into the lungs every 4 (four) hours as needed for wheezing or shortness of breath. 18 g 6  . amLODipine (NORVASC) 10 MG tablet Take 1 tablet (10 mg total) by mouth daily. 30 tablet 6  . budesonide-formoterol (SYMBICORT) 160-4.5 MCG/ACT inhaler Inhale 2 puffs into the lungs 2 (two) times daily. 3 Inhaler 3  . montelukast (SINGULAIR) 10 MG tablet Take 1 tablet (10 mg total) by mouth at bedtime. 30 tablet 3  . VENTOLIN HFA 108 (90 Base) MCG/ACT inhaler INHALE 2 PUFFS INTO THE LUNGS EVERY 4 (FOUR) HOURS AS NEEDED FOR WHEEZING  OR SHORTNESS OF BREATH. 18 g 1  . albuterol (PROVENTIL) (2.5 MG/3ML) 0.083% nebulizer solution Take 3 mLs (2.5 mg total) by nebulization every 6 (six) hours as needed for wheezing or shortness of breath. (Patient not taking: Reported on 10/16/2017) 75 mL 6  . ipratropium (ATROVENT) 0.02 % nebulizer solution Take 2.5 mLs (0.5 mg total) by nebulization 4 (four) times daily. (Patient not taking: Reported on 10/16/2017) 75 mL 5  . mupirocin cream (BACTROBAN) 2 % Apply 1 application topically 2 (two) times daily. Apply to right ear (Patient not taking: Reported on 10/16/2017) 30 g 1  . nicotine (NICODERM CQ) 21 mg/24hr patch Place 1 patch (21 mg total) onto the skin daily. (Patient not taking: Reported on 10/16/2017) 28 patch 0  . Tiotropium Bromide Monohydrate (SPIRIVA RESPIMAT) 1.25 MCG/ACT AERS Inhale 1.25 mcg into the lungs daily. 2 puffs daily (Patient not taking: Reported on 10/16/2017) 1 Inhaler 6  . lisinopril (PRINIVIL,ZESTRIL) 5 MG tablet Take 1 tablet (5 mg total) by mouth daily. (Patient not taking: Reported on 07/11/2017) 30 tablet 6  . umeclidinium bromide (INCRUSE ELLIPTA) 62.5 MCG/INH AEPB Inhale 1 puff into the lungs daily. (Patient not taking: Reported on 10/16/2017) 90 each 3   Facility-Administered Medications Prior to Visit  Medication Dose Route Frequency Provider Last Rate Last Dose  . 0.9 %  sodium chloride infusion  500 mL Intravenous Continuous Milus Banister, MD        ROS  Review of Systems  Constitutional: Negative for activity change, appetite change and fatigue.  HENT: Negative for congestion, sinus pressure and sore throat.   Eyes: Negative for visual disturbance.  Respiratory: Negative for cough, chest tightness, shortness of breath and wheezing.   Cardiovascular: Negative for chest pain and palpitations.  Gastrointestinal: Negative for abdominal distention, abdominal pain and constipation.  Endocrine: Negative for polydipsia.  Genitourinary: Negative for dysuria and  frequency.  Musculoskeletal: Negative for arthralgias and back pain.  Skin: Negative for rash.  Neurological: Negative for tremors, light-headedness and numbness.  Hematological: Does not bruise/bleed easily.  Psychiatric/Behavioral: Negative for agitation and behavioral problems.    Objective:  BP 137/85   Pulse 78   Temp 97.6 F (36.4 C) (Oral)   Ht 5\' 4"  (1.626 m)   Wt 128 lb 9.6 oz (58.3 kg)   LMP 03/29/2011   SpO2 97%   BMI 22.07 kg/m   BP/Weight 10/16/2017 09/13/2017 9/32/3557  Systolic BP 322 025 -  Diastolic BP 85 85 -  Wt. (Lbs) 128.6 - 129  BMI 22.07 - 22.14      Physical Exam  Constitutional: She is oriented to person, place, and time. She appears well-developed and well-nourished.  Cardiovascular: Normal rate, normal heart sounds and intact distal pulses.  No murmur heard. Pulmonary/Chest: Effort normal and breath sounds normal. She has no wheezes. She has no rales. She exhibits no tenderness.  Abdominal: Soft. Bowel sounds are normal. She exhibits no distension and no mass. There is no tenderness.  Musculoskeletal: Normal range of motion.  Neurological: She is alert and oriented to person, place, and time.  Skin: Skin is warm and dry.  Psychiatric: She has a normal mood and affect.     Assessment & Plan:   1. COPD with chronic bronchitis (HCC) No acute exacerbation - umeclidinium bromide (INCRUSE ELLIPTA) 62.5 MCG/INH AEPB; Inhale 1 puff daily into the lungs.  Dispense: 90 each; Refill: 3 - budesonide-formoterol (SYMBICORT) 160-4.5 MCG/ACT inhaler; Inhale 2 puffs 2 (two) times daily into the lungs.  Dispense: 3 Inhaler; Refill: 3 - albuterol (PROVENTIL HFA;VENTOLIN HFA) 108 (90 Base) MCG/ACT inhaler; Inhale 2 puffs every 4 (four) hours as needed into the lungs for wheezing or shortness of breath.  Dispense: 18 g; Refill: 6  2. Seasonal allergic rhinitis due to other allergic trigger Stable - montelukast (SINGULAIR) 10 MG tablet; Take 1 tablet (10 mg  total) at bedtime by mouth.  Dispense: 30 tablet; Refill: 3  3. Essential hypertension Controlled - lisinopril (PRINIVIL,ZESTRIL) 5 MG tablet; Take 1 tablet (5 mg total) daily by mouth.  Dispense: 30 tablet; Refill: 6 - amLODipine (NORVASC) 10 MG tablet; Take 1 tablet (10 mg total) daily by mouth.  Dispense: 30 tablet; Refill: 6  4. Need for influenza vaccination Flu shot administered  5. Tobacco abuse Smoking cessation support: smoking cessation hotline: 1-800-QUIT-NOW.  Smoking cessation classes are available through [facility]. Call [phone] or visit our website at [email or web address]  Spent 3 minutes counseling on smoking cessation and patient is not ready to quit.    Meds ordered this encounter  Medications  . umeclidinium bromide (INCRUSE ELLIPTA) 62.5 MCG/INH AEPB    Sig: Inhale 1 puff daily into the lungs.    Dispense:  90 each    Refill:  3  . montelukast (SINGULAIR) 10 MG tablet    Sig: Take 1 tablet (10 mg total) at bedtime by mouth.    Dispense:  30 tablet    Refill:  3  . lisinopril (PRINIVIL,ZESTRIL) 5 MG tablet    Sig: Take 1 tablet (5 mg total) daily by mouth.    Dispense:  30 tablet    Refill:  6  . budesonide-formoterol (SYMBICORT) 160-4.5 MCG/ACT inhaler    Sig: Inhale 2 puffs 2 (two) times daily into the lungs.    Dispense:  3 Inhaler    Refill:  3  . amLODipine (NORVASC) 10 MG tablet    Sig: Take 1 tablet (10 mg total) daily by mouth.    Dispense:  30 tablet    Refill:  6  . albuterol (PROVENTIL HFA;VENTOLIN HFA) 108 (90 Base) MCG/ACT inhaler    Sig: Inhale 2 puffs every 4 (four) hours as needed into the lungs for wheezing or shortness of breath.    Dispense:  18 g    Refill:  6    Follow-up: 3 months follow-up on COPD and hypertension  Arnoldo Morale MD

## 2017-10-17 ENCOUNTER — Encounter: Payer: Self-pay | Admitting: Family Medicine

## 2017-11-05 ENCOUNTER — Other Ambulatory Visit: Payer: Self-pay | Admitting: Family Medicine

## 2017-11-05 DIAGNOSIS — J3089 Other allergic rhinitis: Secondary | ICD-10-CM

## 2017-11-05 MED FILL — ?CETIRIZINE HCL 10 MG TABLE: 10 | 30 days supply | Qty: 30 | Fill #0

## 2017-11-06 ENCOUNTER — Ambulatory Visit (HOSPITAL_BASED_OUTPATIENT_CLINIC_OR_DEPARTMENT_OTHER): Payer: Self-pay | Admitting: Genetic Counselor

## 2017-11-06 ENCOUNTER — Other Ambulatory Visit: Payer: Self-pay

## 2017-11-06 ENCOUNTER — Encounter: Payer: Self-pay | Admitting: Genetic Counselor

## 2017-11-06 DIAGNOSIS — Z7183 Encounter for nonprocreative genetic counseling: Secondary | ICD-10-CM

## 2017-11-06 DIAGNOSIS — Z803 Family history of malignant neoplasm of breast: Secondary | ICD-10-CM

## 2017-11-06 DIAGNOSIS — Z860100 Personal history of colon polyps, unspecified: Secondary | ICD-10-CM | POA: Insufficient documentation

## 2017-11-06 NOTE — Progress Notes (Signed)
REFERRING PROVIDER: Arnoldo Morale, MD [place], [place] [address]  PRIMARY PROVIDER:  Arnoldo Morale, MD  PRIMARY REASON FOR VISIT:  1. Personal history of colonic polyps   2. Family history of breast cancer      HISTORY OF PRESENT ILLNESS:   Ms. Kerwin, a 51 y.o. female, was seen for a [facility] cancer genetics consultation at the request of Dr. Jarold Song due to a personal history of colon polyps.  Ms. Saar presents to clinic today to discuss the possibility of a hereditary predisposition to cancer, genetic testing, and to further clarify her future cancer risks, as well as potential cancer risks for family members.   In 2018, at the age of 51, Ms. Kayes was diagnosed with polyposis on her colonoscopy.  This was her first colonoscopy, which found 100-200 colon polyps. Three were biopsied and were identified as tubular adenomas.  She is planning on having colon surgery in January.      CANCER HISTORY:   No history exists.     HORMONAL RISK FACTORS:  Menarche was at age 56.  First live birth at age 55.  OCP use for approximately 0 years.  Ovaries intact: yes.  Hysterectomy: no.  Menopausal status: postmenopausal.  HRT use: 0 years. Colonoscopy: yes; abnormal. Mammogram within the last year: yes. Number of breast biopsies: 1. Up to date with pelvic exams:  yes. Any excessive radiation exposure in the past:  no  Past Medical History:  Diagnosis Date  . Allergy   . COPD (chronic obstructive pulmonary disease) ([place])   . Family history of breast cancer   . Hypertension   . Personal history of colonic polyps     Past Surgical History:  Procedure Laterality Date  . REVISION OF SCAR ON FACE/HEAD      Social History   Socioeconomic History  . Marital status: Divorced    Spouse name: Not on file  . Number of children: Not on file  . Years of education: Not on file  . Highest education level: Not on file  Social Needs  . Financial resource strain: Not  on file  . Food insecurity - worry: Not on file  . Food insecurity - inability: Not on file  . Transportation needs - medical: Not on file  . Transportation needs - non-medical: Not on file  Occupational History  . Occupation: Scientist, water quality  Tobacco Use  . Smoking status: Current Every Day Smoker    Packs/day: 0.25    Years: 30.00    Pack years: 7.50    Types: Cigarettes  . Smokeless tobacco: Never Used  . Tobacco comment: 3-5 cigs daily  Substance and Sexual Activity  . Alcohol use: Yes    Alcohol/week: 7.2 oz    Types: 12 Cans of beer per week  . Drug use: No  . Sexual activity: No  Other Topics Concern  . Not on file  Social History Narrative  . Not on file     FAMILY HISTORY:  We obtained a detailed, 4-generation family history.  Significant diagnoses are listed below: Family History  Problem Relation Age of Onset  . Lung cancer Father   . Hypertension Mother   . Breast cancer Maternal Grandmother 96  . Dementia Maternal Grandmother   . Diabetes Maternal Grandfather   . Colon cancer Neg Hx   . Stomach cancer Neg Hx     The patient has four children, three daughters and a son, who are cancer free.  She has  one brother who is also cancer free and has not had colon polyps.  Her father is deceased from lung cancer.  Her mother is alive.  Her mother has 10 siblings who are cancer free as far as the patient is aware.  The maternal grandparents are deceased.  The grandmother had breast cancer at 65 and her grandfather died of diabetes complications.  The patient's father had 10 siblings who did not have cancer.  There is no other cancer on this side of the family.    Ms. Spackman is unaware of previous family history of genetic testing for hereditary cancer risks. Patient's maternal ancestors are of Caucasian descent, and paternal ancestors are of Caucasian descent. There is no reported Ashkenazi Jewish ancestry. There is no known consanguinity.  GENETIC COUNSELING ASSESSMENT:  TANYA CROTHERS is a 51 y.o. female with a personal history of polyposis with 100-200 colon polyps which is somewhat suggestive of a polyposis syndrome and predisposition to cancer. We, therefore, discussed and recommended the following at today's visit.   DISCUSSION: We discussed that many people have colon polyps with most people having fewer than 3.  When someone has more than 10 colon polyps it raises a concern about a hereditary colon cancer syndrome.  When the polyp count becomes as large as 100+, polyposis is suspected and the risk for cancer is greatly elevated.  We discussed several genes that are associated with polypsosis syndromes, such as APC and MUTYH homozygous mutations.  We reviewed the characteristics, features and inheritance patterns of hereditary cancer syndromes. We also discussed genetic testing, including the appropriate family members to test, the process of testing, insurance coverage and turn-around-time for results. We discussed the implications of a negative, positive and/or variant of uncertain significant result. We recommended Ms. Menger pursue genetic testing for the [facility] gene panel. The [facility] gene panel offered by [employer] includes sequencing and deletion/duplication testing of the following 28 genes: APC, ATM, BARD1, BMPR1A, BRCA1, BRCA2, BRIP1, CHD1, CDK4, CDKN2A, CHEK2, EPCAM (large rearrangement only), MLH1, MSH2, MSH6, MUTYH, NBN, PALB2, PMS2, PTEN, RAD51C, RAD51D, SMAD4, STK11, and TP53. Sequencing was performed for select regions of POLE and POLD1, and large rearrangement analysis was performed for select regions of GREM1.   Based on Ms. Dedrick's personal history of cancer, she meets medical criteria for genetic testing. Unfortunately she does not have health insurance.  We discussed the [employer].  She meets medical and financial criteria but did not have her tax form with her today.  She took my card and will fax  it to me.    PLAN: After considering the risks, benefits, and limitations, Ms. Wisdom  provided informed consent to pursue genetic testing and the blood sample was sent to [facility] for analysis of the [facility] test.  She will get me a copy of her tax form so that she can submit that for the assistance program. Results should be available within approximately 2-3 weeks' time after receiving the tax form, at which point they will be disclosed by telephone to Ms. Severa, as will any additional recommendations warranted by these results. Ms. Riccardi will receive a summary of her genetic counseling visit and a copy of her results once available. This information will also be available in Epic. We encouraged Ms. Hanline to remain in contact with cancer genetics annually so that we can continuously update the family history and inform her of any changes in cancer genetics and testing that may be of  benefit for her family. Ms. Termine questions were answered to her satisfaction today. Our contact information was provided should additional questions or concerns arise.  Lastly, we encouraged Ms. Tsuchiya to remain in contact with cancer genetics annually so that we can continuously update the family history and inform her of any changes in cancer genetics and testing that may be of benefit for this family.   Ms.  Keshishyan questions were answered to her satisfaction today. Our contact information was provided should additional questions or concerns arise. Thank you for the referral and allowing [place] to share in the care of your patient.   Avontae Burkhead P. Florene Glen, [place], [facility] Certified Genetic Counselor [email or web address] phone: [phone]  The patient was seen for a total of 30 minutes in face-to-face genetic counseling.  This patient was discussed with Drs. Magrinat, Lindi Adie and/or Burr Medico who agrees with the above.    _______________________________________________________________________ For Office Staff:   Number of people involved in session: 1 Was an Intern/ student involved with case: no

## 2017-11-11 MED FILL — $VENTOLIN HFA 18G INHALER: 108 (90 BAS | 16 days supply | Qty: 18 | Fill #4

## 2017-11-19 MED FILL — $Symbicort 160-4.5mcg/act: 160-4.5 | 30 days supply | Qty: 1 | Fill #3

## 2017-11-20 MED FILL — $INCRUSE ELLIPTA 62.5 MCG I: 62.5 MCG | 30 days supply | Qty: 30 | Fill #1

## 2017-11-27 MED FILL — !VENTOLIN HFA INHALER: 108 (90 BAS | 16 days supply | Qty: 18 | Fill #1

## 2017-11-27 MED FILL — AMLODIPINE BESYLATE 10 MG T: 10 | 30 days supply | Qty: 30 | Fill #1

## 2017-11-29 ENCOUNTER — Encounter: Payer: Self-pay | Admitting: Genetic Counselor

## 2017-11-29 ENCOUNTER — Telehealth: Payer: Self-pay | Admitting: Genetic Counselor

## 2017-11-29 DIAGNOSIS — Z1379 Encounter for other screening for genetic and chromosomal anomalies: Secondary | ICD-10-CM | POA: Insufficient documentation

## 2017-11-29 NOTE — Telephone Encounter (Signed)
LM on VM with good news on results.  Asked that she CB.

## 2017-12-06 NOTE — Telephone Encounter (Signed)
LM on VM with good news.  Asked that she CB. 

## 2017-12-10 ENCOUNTER — Telehealth: Payer: Self-pay | Admitting: Genetic Counselor

## 2017-12-10 NOTE — Telephone Encounter (Signed)
Revealed negative genetic testing.  Discussed that we do not know why she has colon polyps. It could be due to a different gene that we are not testing, or maybe our current technology may not be able to pick something up.  It will be important for her to keep in contact with genetics to keep up with whether additional testing may be needed.

## 2017-12-11 ENCOUNTER — Encounter: Payer: Self-pay | Admitting: Genetic Counselor

## 2017-12-11 ENCOUNTER — Ambulatory Visit: Payer: Self-pay | Admitting: Genetic Counselor

## 2017-12-11 DIAGNOSIS — Z803 Family history of malignant neoplasm of breast: Secondary | ICD-10-CM

## 2017-12-11 DIAGNOSIS — Z86010 Personal history of colonic polyps: Secondary | ICD-10-CM

## 2017-12-11 DIAGNOSIS — Z1379 Encounter for other screening for genetic and chromosomal anomalies: Secondary | ICD-10-CM

## 2017-12-11 NOTE — Progress Notes (Signed)
HPI: Ms. Ewart was previously seen in the [facility] due to a personal history of colon polyps and family history of cancer and concerns regarding a hereditary predisposition to cancer. Please refer to our prior cancer genetics clinic note for more information regarding Ms. Ellenberger's medical, social and family histories, and our assessment and recommendations, at the time. Ms. Turnbaugh recent genetic test results were disclosed to her, as were recommendations warranted by these results. These results and recommendations are discussed in more detail below.  CANCER HISTORY:   No history exists.    FAMILY HISTORY:  We obtained a detailed, 4-generation family history.  Significant diagnoses are listed below: Family History  Problem Relation Age of Onset  . Lung cancer Father   . Hypertension Mother   . Breast cancer Maternal Grandmother 96  . Dementia Maternal Grandmother   . Diabetes Maternal Grandfather   . Colon cancer Neg Hx   . Stomach cancer Neg Hx     The patient has four children, three daughters and a son, who are cancer free.  She has one brother who is also cancer free and has not had colon polyps.  Her father is deceased from lung cancer.  Her mother is alive.  Her mother has 10 siblings who are cancer free as far as the patient is aware.  The maternal grandparents are deceased.  The grandmother had breast cancer at 66 and her grandfather died of diabetes complications.  The patient's father had 10 siblings who did not have cancer.  There is no other cancer on this side of the family.    Ms. Dicola is unaware of previous family history of genetic testing for hereditary cancer risks. Patient's maternal ancestors are of Caucasian descent, and paternal ancestors are of Caucasian descent. There is no reported Ashkenazi Jewish ancestry. There is no known consanguinity.  GENETIC TEST RESULTS: Genetic testing reported out on December 10, 2017 through the [facility] cancer panel found no deleterious mutations.  The [facility] gene panel offered by [employer] includes sequencing and deletion/duplication testing of the following 28 genes: APC, ATM, BARD1, BMPR1A, BRCA1, BRCA2, BRIP1, CHD1, CDK4, CDKN2A, CHEK2, EPCAM (large rearrangement only), MLH1, MSH2, MSH6, MUTYH, NBN, PALB2, PMS2, PTEN, RAD51C, RAD51D, SMAD4, STK11, and TP53. Sequencing was performed for select regions of POLE and POLD1, and large rearrangement analysis was performed for select regions of GREM1.  The test report has been scanned into EPIC and is located under the Molecular Pathology section of the Results Review tab.   We discussed with Ms. Cavness that since the current genetic testing is not perfect, it is possible there may be a gene mutation in one of these genes that current testing cannot detect, but that chance is small. We also discussed, that it is possible that another gene that has not yet been discovered, or that we have not yet tested, is responsible for the cancer diagnoses in the family, and it is, therefore, important to remain in touch with cancer genetics in the future so that we can continue to offer Ms. Morris the most up to date genetic testing.     CANCER SCREENING RECOMMENDATIONS: This negative genetic test simply tells [place] that we cannot yet define why Ms. Pigman has had an increased number of colorectal polyps. Ms. Heymann's medical management and screening should be based on the prospect that she will likely form more colon polyps and should, therefore, undergo more frequent colonoscopy screening at intervals determined by  her GI providers.  We also recommended that Ms. Odriscoll have an upper endoscopy periodically.  Based on Ms. 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  According to the report,  Ms. Lynn had 200-300  These ranged from 40m to 231msessile polyps,  And 41m29mo 2cm pedunculated polyps.According to the [facility]) Genetic/Familial High-Risk Assessment: Colorectal Guidelines (v.3.2017) (httAffordableShare.com.brf).  A clinical diagnosis of serrated polyposis is considered in an individual who meets at least one of the following empiric criteria:  1. At least 5 serrated polyps proximal to the sigmoid colon with 2 or more of these being >10 mm 2. Any number of serrated polyps proximal to the sigmoid colon in an individual who has a first-degree relative with serrated polyposis,  3. >/= 20 serrated polyps of any size, but distributed throughout the colon.  Currently, no causative gene has been identified for serrated polyposis.  The risk for colon cancer in this syndrome is elevated, although the precise risk remains to be defined.  Occasionally, more than one affected case of serrated polyposis is seen in a family, and therefore family members should undergo colon screening.  Surveillance recommendations for individuals with serrated polyposis include:  1. Colonoscopy with polypectomy until all polyps >/= 5 mm are removed, then colonoscopy every 1-3 years depending on the number and size of polyps.  Clearing of all polyps is preferable but not always possible. 2. Consider surgical referral if colonoscopic treatment and/or surveillance is inadequate or if high-grade dysplasia occurs.  RECOMMENDATIONS FOR FAMILY MEMBERS: The risk for colon cancer in relatives of individuals with serrated polyposis is still unclear.  Pending further data, it is reasonable to screen first-degree relatives at the youngest age of onset of serrated polyposis diagnosis and subsequently per colonoscopic findings.  First degree relatives are encouraged to have colonoscopy at the earliest of the following: 1. Age 59 27 Same age  as youngest diagnosis of serrated polyposis if uncomplicated by cancer,  3. 10 years earlier than the earliest diagnosis in family of Colorectal cancer complicating serrated polyposis.  Following the baseline exam, family members should repeat their colonoscopy every 5 years if no polyps are found.  If proximal serrated polyps or multiple adenomas are found, consider colonoscopy every 3 years.  RECOMMENDATIONS FOR FAMILY MEMBERS: Women in this family might be at some increased risk of developing cancer, over the general population risk, simply due to the family history of cancer. We recommended women in this family have a yearly mammogram beginning at age 62,47r 10 7ars younger than the earliest onset of cancer, an annual clinical breast exam, and perform monthly breast self-exams. Women in this family should also have a gynecological exam as recommended by their primary provider. All family members should have a colonoscopy by age 15.30FOLLOW-UP: Lastly, we discussed with Ms. LonMullerat cancer genetics is a rapidly advancing field and it is possible that new genetic tests will be appropriate for her and/or her family members in the future. We encouraged her to remain in contact with cancer genetics on an annual basis so we can update her personal and family histories and let her know of advances in cancer genetics that may benefit this family.   Our contact number was provided. Ms. LonRobleyestions were answered to her satisfaction, and she knows she is welcome to call us [place] anytime  with additional questions or concerns.   Roma Kayser, MS, [facility] Certified Genetic Counselor [email or web address]

## 2017-12-13 ENCOUNTER — Telehealth: Payer: Self-pay

## 2017-12-13 MED FILL — $VENTOLIN HFA 18G INHALER: 108 (90 BAS | 16 days supply | Qty: 18 | Fill #5

## 2017-12-13 MED FILL — ?CETIRIZINE HCL 10 MG TABLE: 10 | 30 days supply | Qty: 30 | Fill #1

## 2017-12-13 NOTE — Progress Notes (Signed)
Thanks.  Tanya Jordan, I cannot tell if she has followed up with [facility] referral to consider total colectomy.  Can you check on that?  Also, it looks like she has never responded about EGD.  She clearly has a polyposis syndrome even despite the genetic testing results and so I still recommend she have EGD at her soonest convenience.  If you are not able to reach her, please try any emergency contact numbers available, send a letter and let her PCP know what is going on.  Thanks  DJ

## 2017-12-13 NOTE — Telephone Encounter (Signed)
[facility] received the referral and was unable to reach the pt.  They will reach out to her again today and fax a confirmation.

## 2017-12-13 NOTE — Telephone Encounter (Signed)
I have called all available numbers for the pt and mailed a letter on 10/02/17.  I have placed a call to [facility] to inquire about appt status.

## 2017-12-13 NOTE — Telephone Encounter (Signed)
Tanya Jordan, I cannot tell if she has followed up with [facility] referral to consider total colectomy.  Can you check on that?  Also, it looks like she has never responded about EGD.  She clearly has a polyposis syndrome even despite the genetic testing results and so I still recommend she have EGD at her soonest convenience.  If you are not able to reach her, please try any emergency contact numbers available, send a letter and let her PCP know what is going on.  Thanks  DJ

## 2017-12-16 NOTE — Telephone Encounter (Signed)
Ok,  Can you mail another letter, certified mail?  Thanks

## 2017-12-16 NOTE — Telephone Encounter (Signed)
Letter resent certified mail also appt info received from [facility] with appt for 12/25/17 at 8:45 am.  Dr Johney Maine

## 2018-01-01 MED FILL — AMLODIPINE BESYLATE 10 MG T: 10 | 30 days supply | Qty: 30 | Fill #2

## 2018-01-01 MED FILL — $INCRUSE ELLIPTA 62.5 MCG I: 62.5 MCG | 30 days supply | Qty: 30 | Fill #2

## 2018-01-01 MED FILL — $Symbicort 160-4.5mcg/act: 160-4.5 | 30 days supply | Qty: 1 | Fill #4

## 2018-01-01 MED FILL — $VENTOLIN HFA 18G INHALER: 108 (90 BAS | 16 days supply | Qty: 18 | Fill #6

## 2018-01-17 ENCOUNTER — Ambulatory Visit: Payer: No Typology Code available for payment source | Admitting: Family Medicine

## 2018-01-21 ENCOUNTER — Encounter: Payer: Self-pay | Admitting: Family Medicine

## 2018-01-21 ENCOUNTER — Ambulatory Visit: Payer: Self-pay | Attending: Family Medicine | Admitting: Family Medicine

## 2018-01-21 VITALS — BP 153/81 | HR 72 | Temp 97.8°F | Ht 64.0 in | Wt 126.2 lb

## 2018-01-21 DIAGNOSIS — Z79899 Other long term (current) drug therapy: Secondary | ICD-10-CM | POA: Insufficient documentation

## 2018-01-21 DIAGNOSIS — F1010 Alcohol abuse, uncomplicated: Secondary | ICD-10-CM | POA: Insufficient documentation

## 2018-01-21 DIAGNOSIS — J302 Other seasonal allergic rhinitis: Secondary | ICD-10-CM | POA: Insufficient documentation

## 2018-01-21 DIAGNOSIS — I10 Essential (primary) hypertension: Secondary | ICD-10-CM | POA: Insufficient documentation

## 2018-01-21 DIAGNOSIS — Z780 Asymptomatic menopausal state: Secondary | ICD-10-CM

## 2018-01-21 DIAGNOSIS — G4700 Insomnia, unspecified: Secondary | ICD-10-CM | POA: Insufficient documentation

## 2018-01-21 DIAGNOSIS — Z720 Tobacco use: Secondary | ICD-10-CM

## 2018-01-21 DIAGNOSIS — J3089 Other allergic rhinitis: Secondary | ICD-10-CM

## 2018-01-21 DIAGNOSIS — G43909 Migraine, unspecified, not intractable, without status migrainosus: Secondary | ICD-10-CM | POA: Insufficient documentation

## 2018-01-21 DIAGNOSIS — R5383 Other fatigue: Secondary | ICD-10-CM | POA: Insufficient documentation

## 2018-01-21 DIAGNOSIS — J449 Chronic obstructive pulmonary disease, unspecified: Secondary | ICD-10-CM | POA: Insufficient documentation

## 2018-01-21 DIAGNOSIS — N951 Menopausal and female climacteric states: Secondary | ICD-10-CM | POA: Insufficient documentation

## 2018-01-21 DIAGNOSIS — M791 Myalgia, unspecified site: Secondary | ICD-10-CM | POA: Insufficient documentation

## 2018-01-21 DIAGNOSIS — J018 Other acute sinusitis: Secondary | ICD-10-CM | POA: Insufficient documentation

## 2018-01-21 MED ORDER — AMOXICILLIN 500 MG PO CAPS: 500.0000 mg | ORAL_CAPSULE | Freq: Three times a day (TID) | ORAL | 0 refills | Status: DC

## 2018-01-21 MED ORDER — CLONIDINE HCL 0.1 MG PO TABS: 0.1000 mg | ORAL_TABLET | Freq: Every day | ORAL | 3 refills | Status: DC

## 2018-01-21 MED ORDER — UMECLIDINIUM BROMIDE 62.5 MCG/INH IN AEPB: 1.0000 | INHALATION_SPRAY | Freq: Every day | RESPIRATORY_TRACT | 3 refills | Status: DC

## 2018-01-21 MED ORDER — CETIRIZINE HCL 10 MG PO TABS: 10.0000 mg | ORAL_TABLET | Freq: Every day | ORAL | 1 refills | Status: DC

## 2018-01-21 MED ORDER — VENLAFAXINE HCL ER 75 MG PO CP24: 75.0000 mg | ORAL_CAPSULE | Freq: Every day | ORAL | 3 refills | Status: DC

## 2018-01-21 MED ORDER — BUDESONIDE-FORMOTEROL FUMARATE 160-4.5 MCG/ACT IN AERO: 2.0000 | INHALATION_SPRAY | Freq: Two times a day (BID) | RESPIRATORY_TRACT | 3 refills | Status: DC

## 2018-01-21 MED ORDER — MONTELUKAST SODIUM 10 MG PO TABS: 10.0000 mg | ORAL_TABLET | Freq: Every day | ORAL | 3 refills | Status: DC

## 2018-01-21 MED ORDER — ALBUTEROL SULFATE HFA 108 (90 BASE) MCG/ACT IN AERS: 2.0000 | INHALATION_SPRAY | RESPIRATORY_TRACT | 6 refills | Status: DC | PRN

## 2018-01-21 MED ORDER — LISINOPRIL 5 MG PO TABS: 5.0000 mg | ORAL_TABLET | Freq: Every day | ORAL | 6 refills | Status: DC

## 2018-01-21 MED ORDER — AMLODIPINE BESYLATE 10 MG PO TABS: 10.0000 mg | ORAL_TABLET | Freq: Every day | ORAL | 6 refills | Status: DC

## 2018-01-21 MED FILL — $INCRUSE ELLIPTA 62.5 MCG I: 62.5 MCG | 30 days supply | Qty: 30 | Fill #3

## 2018-01-21 MED FILL — $Symbicort 160-4.5mcg/act: 160-4.5 | 30 days supply | Qty: 1 | Fill #5

## 2018-01-21 MED FILL — VENLAFAXINE HCL ER 75 MG CA: 75 | 30 days supply | Qty: 30 | Fill #0

## 2018-01-21 MED FILL — !VENTOLIN HFA INHALER: 108 (90 BAS | 16 days supply | Qty: 18 | Fill #2

## 2018-01-21 MED FILL — AMOXICILLIN 500 MG CAPSULE: 500 | 10 days supply | Qty: 30 | Fill #0

## 2018-01-21 MED FILL — LISINOPRIL 5 MG TABLET: 5 | 30 days supply | Qty: 30 | Fill #1

## 2018-01-21 MED FILL — ?MONTELUKAST SOD 10 MG TAB: 10 | 30 days supply | Qty: 30 | Fill #1

## 2018-01-21 MED FILL — ?CETIRIZINE HCL 10 MG TABLE: 10 | 30 days supply | Qty: 30 | Fill #0

## 2018-01-21 MED FILL — cloNIDine HCL 0.1 MG TABS: 0.1 | 30 days supply | Qty: 30 | Fill #0

## 2018-01-21 NOTE — Progress Notes (Signed)
Subjective:  Patient ID: Tanya Jordan, female    DOB: September 23, 1966  Age: 52 y.o. MRN: [id number]  CC: Hypertension; COPD; and Migraine   HPI Tanya Jordan  is 52 year old female with a history of COPD, alcohol abuse, seasonal allergies, hypertension who presents today for a follow-up.  She complains of a 3 week history of fatigue, myalgias, right temporal headache, nasal congestion, cough productive of whitish sputum. She has also had alternating hot and cold sensations, ears popping but is not sure if she had a fever; wonders if she has the flu. She has been fatigued when she gets off work and always need to take a "power nap". She endorses insomnia and hot flashes; sleep is interrupted every hour. She admits to mood swings and snaps at people easily.  Her blood pressure is elevated and she attributes this to smoking prior to walking into the clinic.  She has been compliant with her Symbicort and MDI but does not have the incruse.  Past Medical History:  Diagnosis Date  . Allergy   . COPD (chronic obstructive pulmonary disease) ([place])   . Family history of breast cancer   . Hypertension   . Personal history of colonic polyps     Past Surgical History:  Procedure Laterality Date  . REVISION OF SCAR ON FACE/HEAD      No Known Allergies   Outpatient Medications Prior to Visit  Medication Sig Dispense Refill  . fluticasone (FLONASE) 50 MCG/ACT nasal spray Place 2 sprays into both nostrils daily. 16 g 6  . albuterol (PROVENTIL HFA;VENTOLIN HFA) 108 (90 Base) MCG/ACT inhaler Inhale 2 puffs every 4 (four) hours as needed into the lungs for wheezing or shortness of breath. 18 g 6  . amLODipine (NORVASC) 10 MG tablet Take 1 tablet (10 mg total) daily by mouth. 30 tablet 6  . budesonide-formoterol (SYMBICORT) 160-4.5 MCG/ACT inhaler Inhale 2 puffs 2 (two) times daily into the lungs. 3 Inhaler 3  . cetirizine (ZYRTEC) 10 MG tablet TAKE 1 TABLET BY MOUTH DAILY. 30 tablet 1  .  montelukast (SINGULAIR) 10 MG tablet Take 1 tablet (10 mg total) at bedtime by mouth. 30 tablet 3  . albuterol (PROVENTIL) (2.5 MG/3ML) 0.083% nebulizer solution Take 3 mLs (2.5 mg total) by nebulization every 6 (six) hours as needed for wheezing or shortness of breath. (Patient not taking: Reported on 10/16/2017) 75 mL 6  . mupirocin cream (BACTROBAN) 2 % Apply 1 application topically 2 (two) times daily. Apply to right ear (Patient not taking: Reported on 10/16/2017) 30 g 1  . nicotine (NICODERM CQ) 21 mg/24hr patch Place 1 patch (21 mg total) onto the skin daily. (Patient not taking: Reported on 10/16/2017) 28 patch 0  . ipratropium (ATROVENT) 0.02 % nebulizer solution Take 2.5 mLs (0.5 mg total) by nebulization 4 (four) times daily. (Patient not taking: Reported on 10/16/2017) 75 mL 5  . lisinopril (PRINIVIL,ZESTRIL) 5 MG tablet Take 1 tablet (5 mg total) daily by mouth. (Patient not taking: Reported on 01/21/2018) 30 tablet 6  . Tiotropium Bromide Monohydrate (SPIRIVA RESPIMAT) 1.25 MCG/ACT AERS Inhale 1.25 mcg into the lungs daily. 2 puffs daily (Patient not taking: Reported on 10/16/2017) 1 Inhaler 6  . umeclidinium bromide (INCRUSE ELLIPTA) 62.5 MCG/INH AEPB Inhale 1 puff daily into the lungs. (Patient not taking: Reported on 01/21/2018) 90 each 3   Facility-Administered Medications Prior to Visit  Medication Dose Route Frequency Provider Last Rate Last Dose  . 0.9 %  sodium chloride infusion  500 mL Intravenous Continuous Milus Banister, MD        ROS Review of Systems General: negative for fever, weight loss, appetite change, + fatigue Eyes: no visual symptoms. ENT: +ear symptoms, no sinus tenderness, +nasal congestion, no sore throat. Neck: no pain  Respiratory: no wheezing, shortness of breath,+ cough Cardiovascular: no chest pain, no dyspnea on exertion, no pedal edema, no orthopnea. Gastrointestinal: no abdominal pain, no diarrhea, no constipation Genito-Urinary: no urinary  frequency, no dysuria, no polyuria. Hematologic: no bruising Endocrine: no cold or heat intolerance Neurological: + headaches, no seizures, no tremors Musculoskeletal: no joint pains, no joint swelling Skin: no pruritus, no rash. Psychological: + depression, + anxiety,    Objective:  BP (!) 153/81   Pulse 72   Temp 97.8 F (36.6 C) (Oral)   Ht 5' 4"  (1.626 m)   Wt 126 lb 3.2 oz (57.2 kg)   LMP 03/29/2011   SpO2 98%   BMI 21.66 kg/m   BP/Weight 01/21/2018 10/16/2017 78/67/5449  Systolic BP 201 007 121  Diastolic BP 81 85 85  Wt. (Lbs) 126.2 128.6 -  BMI 21.66 22.07 -      Physical Exam Constitutional: normal appearing,  Eyes: PERRLA HEENT: Head is atraumatic, normal sinuses, normal oropharynx, normal appearing tonsils and palate, tympanic membrane is normal bilaterally.Right maxillary sinus tenderness Neck: normal range of motion, no thyromegaly, no JVD Cardiovascular: normal rate and rhythm, normal heart sounds, no murmurs, rub or gallop, no pedal edema Respiratory: clear to auscultation bilaterally, no wheezes, no rales, no rhonchi Abdomen: soft, not tender to palpation, normal bowel sounds, no enlarged organs Extremities: Full ROM, no tenderness in joints Skin: warm and dry, no lesions. Neurological: alert, oriented x3, cranial nerves I-XII grossly intact , normal motor strength, normal sensation. Psychological: normal mood.    Assessment & Plan:   1. COPD with chronic bronchitis (HCC) Stable - budesonide-formoterol (SYMBICORT) 160-4.5 MCG/ACT inhaler; Inhale 2 puffs into the lungs 2 (two) times daily.  Dispense: 3 Inhaler; Refill: 3 - albuterol (PROVENTIL HFA;VENTOLIN HFA) 108 (90 Base) MCG/ACT inhaler; Inhale 2 puffs into the lungs every 4 (four) hours as needed for wheezing or shortness of breath.  Dispense: 18 g; Refill: 6 - umeclidinium bromide (INCRUSE ELLIPTA) 62.5 MCG/INH AEPB; Inhale 1 puff into the lungs daily.  Dispense: 90 each; Refill: 3  2.  Essential hypertension Uncontrolled - she just smoked a ciggarrette prior to coming No regimen changes today Counseled on blood pressure goal of less than 130/80, low-sodium, DASH diet, medication compliance, 150 minutes of moderate intensity exercise per week. Discussed medication compliance, adverse effects. - CMP14+EGFR - amLODipine (NORVASC) 10 MG tablet; Take 1 tablet (10 mg total) by mouth daily.  Dispense: 30 tablet; Refill: 6 - lisinopril (PRINIVIL,ZESTRIL) 5 MG tablet; Take 1 tablet (5 mg total) by mouth daily.  Dispense: 30 tablet; Refill: 6  3. Seasonal allergic rhinitis due to other allergic trigger Controlled - cetirizine (ZYRTEC) 10 MG tablet; Take 1 tablet (10 mg total) by mouth daily.  Dispense: 30 tablet; Refill: 1 - montelukast (SINGULAIR) 10 MG tablet; Take 1 tablet (10 mg total) by mouth at bedtime.  Dispense: 30 tablet; Refill: 3  4. Other fatigue Could be secondary to insomnia vs stress from work Will rule out metabolic causes - VITAMIN D 25 Hydroxy (Vit-D Deficiency, Fractures) - TSH  5. Menopause Will place on clonidine for insomnia and effexor for mood swings - cloNIDine (CATAPRES) 0.1 MG tablet; Take 1 tablet (0.1 mg total) by  mouth at bedtime. For hot flashes  Dispense: 30 tablet; Refill: 3 - venlafaxine XR (EFFEXOR XR) 75 MG 24 hr capsule; Take 1 capsule (75 mg total) by mouth daily with breakfast.  Dispense: 30 capsule; Refill: 3  6. Acute non-recurrent sinusitis of other sinus - amoxicillin (AMOXIL) 500 MG capsule; Take 1 capsule (500 mg total) by mouth 3 (three) times daily.  Dispense: 30 capsule; Refill: 0  7. Tobacco abuse Smoking cessation support: smoking cessation hotline: 1-800-QUIT-NOW.  Smoking cessation classes are available through [facility]. Call [phone] or visit our website at [email or web address]  Spent 3 minutes counseling on dangers of tobacco use and benefits of quitting and patient is not ready to  quit.    Meds ordered this encounter  Medications  . budesonide-formoterol (SYMBICORT) 160-4.5 MCG/ACT inhaler    Sig: Inhale 2 puffs into the lungs 2 (two) times daily.    Dispense:  3 Inhaler    Refill:  3  . amoxicillin (AMOXIL) 500 MG capsule    Sig: Take 1 capsule (500 mg total) by mouth 3 (three) times daily.    Dispense:  30 capsule    Refill:  0  . albuterol (PROVENTIL HFA;VENTOLIN HFA) 108 (90 Base) MCG/ACT inhaler    Sig: Inhale 2 puffs into the lungs every 4 (four) hours as needed for wheezing or shortness of breath.    Dispense:  18 g    Refill:  6  . amLODipine (NORVASC) 10 MG tablet    Sig: Take 1 tablet (10 mg total) by mouth daily.    Dispense:  30 tablet    Refill:  6  . cetirizine (ZYRTEC) 10 MG tablet    Sig: Take 1 tablet (10 mg total) by mouth daily.    Dispense:  30 tablet    Refill:  1  . lisinopril (PRINIVIL,ZESTRIL) 5 MG tablet    Sig: Take 1 tablet (5 mg total) by mouth daily.    Dispense:  30 tablet    Refill:  6  . montelukast (SINGULAIR) 10 MG tablet    Sig: Take 1 tablet (10 mg total) by mouth at bedtime.    Dispense:  30 tablet    Refill:  3  . umeclidinium bromide (INCRUSE ELLIPTA) 62.5 MCG/INH AEPB    Sig: Inhale 1 puff into the lungs daily.    Dispense:  90 each    Refill:  3  . cloNIDine (CATAPRES) 0.1 MG tablet    Sig: Take 1 tablet (0.1 mg total) by mouth at bedtime. For hot flashes    Dispense:  30 tablet    Refill:  3  . venlafaxine XR (EFFEXOR XR) 75 MG 24 hr capsule    Sig: Take 1 capsule (75 mg total) by mouth daily with breakfast.    Dispense:  30 capsule    Refill:  3    Follow-up: Return in about 3 months (around 04/20/2018) for follow up of chronic medical conditions.   Charlott Rakes MD

## 2018-01-21 NOTE — Patient Instructions (Signed)
Menopause Menopause is the normal time of life when menstrual periods stop completely. Menopause is complete when you have missed 12 consecutive menstrual periods. It usually occurs between the ages of 32 years and 54 years. Very rarely does a woman develop menopause before the age of 40 years. At menopause, your ovaries stop producing the female hormones estrogen and progesterone. This can cause undesirable symptoms and also affect your health. Sometimes the symptoms may occur 4-5 years before the menopause begins. There is no relationship between menopause and:  Oral contraceptives.  Number of children you had.  Race.  The age your menstrual periods started (menarche).  Heavy smokers and very thin women may develop menopause earlier in life. What are the causes?  The ovaries stop producing the female hormones estrogen and progesterone. Other causes include:  Surgery to remove both ovaries.  The ovaries stop functioning for no known reason.  Tumors of the pituitary gland in the brain.  Medical disease that affects the ovaries and hormone production.  Radiation treatment to the abdomen or pelvis.  Chemotherapy that affects the ovaries.  What are the signs or symptoms?  Hot flashes.  Night sweats.  Decrease in sex drive.  Vaginal dryness and thinning of the vagina causing painful intercourse.  Dryness of the skin and developing wrinkles.  Headaches.  Tiredness.  Irritability.  Memory problems.  Weight gain.  Bladder infections.  Hair growth of the face and chest.  Infertility. More serious symptoms include:  Loss of bone (osteoporosis) causing breaks (fractures).  Depression.  Hardening and narrowing of the arteries (atherosclerosis) causing heart attacks and strokes.  How is this diagnosed?  When the menstrual periods have stopped for 12 straight months.  Physical exam.  Hormone studies of the blood. How is this treated? There are many treatment  choices and nearly as many questions about them. The decisions to treat or not to treat menopausal changes is an individual choice made with your health care provider. Your health care provider can discuss the treatments with you. Together, you can decide which treatment will work best for you. Your treatment choices may include:  Hormone therapy (estrogen and progesterone).  Non-hormonal medicines.  Treating the individual symptoms with medicine (for example antidepressants for depression).  Herbal medicines that may help specific symptoms.  Counseling by a psychiatrist or psychologist.  Group therapy.  Lifestyle changes including: ? Eating healthy. ? Regular exercise. ? Limiting caffeine and alcohol. ? Stress management and meditation.  No treatment.  Follow these instructions at home:  Take the medicine your health care provider gives you as directed.  Get plenty of sleep and rest.  Exercise regularly.  Eat a diet that contains calcium (good for the bones) and soy products (acts like estrogen hormone).  Avoid alcoholic beverages.  Do not smoke.  If you have hot flashes, dress in layers.  Take supplements, calcium, and vitamin D to strengthen bones.  You can use over-the-counter lubricants or moisturizers for vaginal dryness.  Group therapy is sometimes very helpful.  Acupuncture may be helpful in some cases. Contact a health care provider if:  You are not sure you are in menopause.  You are having menopausal symptoms and need advice and treatment.  You are still having menstrual periods after age 17 years.  You have pain with intercourse.  Menopause is complete (no menstrual period for 12 months) and you develop vaginal bleeding.  You need a referral to a specialist (gynecologist, psychiatrist, or psychologist) for treatment. Get help right  away if:  You have severe depression.  You have excessive vaginal bleeding.  You fell and think you have a  broken bone.  You have pain when you urinate.  You develop leg or chest pain.  You have a fast pounding heart beat (palpitations).  You have severe headaches.  You develop vision problems.  You feel a lump in your breast.  You have abdominal pain or severe indigestion. This information is not intended to replace advice given to you by your health care provider. Make sure you discuss any questions you have with your health care provider. Document Released: 02/09/2004 Document Revised: 04/26/2016 Document Reviewed: 06/18/2013 Elsevier Interactive Patient Education  2017 [employer].

## 2018-01-21 NOTE — Progress Notes (Signed)
Pt states she knows to take Incruse but hasn't picked it up yet. I also saw that she has Spiriva on her chart.

## 2018-01-21 NOTE — Progress Notes (Signed)
Patient states that she has been feeling bad and has no energy for 3 weeks.

## 2018-01-22 ENCOUNTER — Other Ambulatory Visit: Payer: Self-pay | Admitting: Family Medicine

## 2018-01-22 LAB — CMP14+EGFR
A/G RATIO: 1.8 (ref 1.2–2.2)
ALK PHOS: 81 IU/L (ref 39–117)
ALT: 11 IU/L (ref 0–32)
AST: 10 IU/L (ref 0–40)
Albumin: 4.4 g/dL (ref 3.5–5.5)
BUN/Creatinine Ratio: 16 (ref 9–23)
BUN: 12 mg/dL (ref 6–24)
Bilirubin Total: 0.2 mg/dL (ref 0.0–1.2)
CO2: 23 mmol/L (ref 20–29)
CREATININE: 0.77 mg/dL (ref 0.57–1.00)
Calcium: 9.2 mg/dL (ref 8.7–10.2)
Chloride: 104 mmol/L (ref 96–106)
GFR calc Af Amer: 103 mL/min/{1.73_m2} (ref >59)
GFR calc non Af Amer: 89 mL/min/{1.73_m2} (ref >59)
Globulin, Total: 2.4 g/dL (ref 1.5–4.5)
Glucose: 80 mg/dL (ref 65–99)
POTASSIUM: 3.8 mmol/L (ref 3.5–5.2)
SODIUM: 143 mmol/L (ref 134–144)
Total Protein: 6.8 g/dL (ref 6.0–8.5)

## 2018-01-22 LAB — VITAMIN D 25 HYDROXY (VIT D DEFICIENCY, FRACTURES): VIT D 25 HYDROXY: 7.1 ng/mL — AB (ref 30.0–100.0)

## 2018-01-22 LAB — TSH: TSH: 0.943 u[IU]/mL (ref 0.450–4.500)

## 2018-01-22 MED ORDER — ERGOCALCIFEROL 1.25 MG (50000 UT) PO CAPS: 50000.0000 [IU] | ORAL_CAPSULE | ORAL | 1 refills | Status: DC

## 2018-01-22 MED FILL — VIT D2 1.25 MG (50,000 UNIT: 1.25 MG | 28 days supply | Qty: 4 | Fill #0

## 2018-02-03 ENCOUNTER — Telehealth: Payer: Self-pay

## 2018-02-03 NOTE — Telephone Encounter (Signed)
Patient was called and informed of lab results. 

## 2018-02-17 MED FILL — VIT D2 1.25 MG (50,000 UNIT: 1.25 MG | 28 days supply | Qty: 4 | Fill #1

## 2018-02-17 MED FILL — CETIRIZINE HCL 10 MG TABS: 10 | 30 days supply | Qty: 30 | Fill #1

## 2018-02-17 MED FILL — cloNIDine HCL 0.1 MG TABS: 0.1 | 30 days supply | Qty: 30 | Fill #1

## 2018-02-17 MED FILL — !VENTOLIN HFA INHALER: 108 (90 BAS | 16 days supply | Qty: 18 | Fill #3

## 2018-02-17 MED FILL — VENLAFAXINE HCL ER 75 MG CA: 75 | 30 days supply | Qty: 30 | Fill #1

## 2018-02-17 MED FILL — AMLODIPINE BESYLATE 10 MG T: 10 | 30 days supply | Qty: 30 | Fill #3

## 2018-03-14 ENCOUNTER — Other Ambulatory Visit: Payer: Self-pay | Admitting: Family Medicine

## 2018-03-14 DIAGNOSIS — J3089 Other allergic rhinitis: Secondary | ICD-10-CM

## 2018-03-14 MED FILL — AMLODIPINE BESYLATE 10 MG T: 10 | 30 days supply | Qty: 30 | Fill #4

## 2018-03-14 MED FILL — VENLAFAXINE HCL ER 75 MG CA: 75 | 30 days supply | Qty: 30 | Fill #2

## 2018-03-14 MED FILL — cloNIDine HCL 0.1 MG TABS: 0.1 | 30 days supply | Qty: 30 | Fill #2

## 2018-03-14 MED FILL — !VENTOLIN HFA INHALER: 108 (90 BAS | 16 days supply | Qty: 18 | Fill #4

## 2018-04-10 MED FILL — !SYMBICORT 160-4.5 MCG INH: 160-4.5 | 30 days supply | Qty: 1 | Fill #6

## 2018-04-10 MED FILL — !VENTOLIN HFA INHALER: 108 (90 BAS | 16 days supply | Qty: 18 | Fill #5

## 2018-04-21 ENCOUNTER — Encounter: Payer: Self-pay | Admitting: Family Medicine

## 2018-04-21 ENCOUNTER — Ambulatory Visit: Payer: Self-pay | Attending: Family Medicine | Admitting: Family Medicine

## 2018-04-21 VITALS — BP 132/79 | HR 79 | Temp 98.3°F | Ht 64.0 in | Wt 127.4 lb

## 2018-04-21 DIAGNOSIS — Z720 Tobacco use: Secondary | ICD-10-CM

## 2018-04-21 DIAGNOSIS — I10 Essential (primary) hypertension: Secondary | ICD-10-CM

## 2018-04-21 DIAGNOSIS — F1010 Alcohol abuse, uncomplicated: Secondary | ICD-10-CM | POA: Insufficient documentation

## 2018-04-21 DIAGNOSIS — Z86010 Personal history of colonic polyps: Secondary | ICD-10-CM | POA: Insufficient documentation

## 2018-04-21 DIAGNOSIS — Z79899 Other long term (current) drug therapy: Secondary | ICD-10-CM | POA: Insufficient documentation

## 2018-04-21 DIAGNOSIS — J3089 Other allergic rhinitis: Secondary | ICD-10-CM

## 2018-04-21 DIAGNOSIS — J449 Chronic obstructive pulmonary disease, unspecified: Secondary | ICD-10-CM

## 2018-04-21 DIAGNOSIS — E559 Vitamin D deficiency, unspecified: Secondary | ICD-10-CM

## 2018-04-21 DIAGNOSIS — J4489 Other specified chronic obstructive pulmonary disease: Secondary | ICD-10-CM

## 2018-04-21 DIAGNOSIS — Z9889 Other specified postprocedural states: Secondary | ICD-10-CM | POA: Insufficient documentation

## 2018-04-21 DIAGNOSIS — F17210 Nicotine dependence, cigarettes, uncomplicated: Secondary | ICD-10-CM | POA: Insufficient documentation

## 2018-04-21 DIAGNOSIS — Z780 Asymptomatic menopausal state: Secondary | ICD-10-CM

## 2018-04-21 DIAGNOSIS — J302 Other seasonal allergic rhinitis: Secondary | ICD-10-CM | POA: Insufficient documentation

## 2018-04-21 DIAGNOSIS — Z803 Family history of malignant neoplasm of breast: Secondary | ICD-10-CM | POA: Insufficient documentation

## 2018-04-21 MED ORDER — ERGOCALCIFEROL 1.25 MG (50000 UT) PO CAPS: 50000.0000 [IU] | ORAL_CAPSULE | ORAL | 1 refills | Status: DC

## 2018-04-21 MED ORDER — CLONIDINE HCL 0.1 MG PO TABS: 0.1000 mg | ORAL_TABLET | Freq: Every day | ORAL | 3 refills | Status: DC

## 2018-04-21 MED ORDER — MONTELUKAST SODIUM 10 MG PO TABS: 10.0000 mg | ORAL_TABLET | Freq: Every day | ORAL | 3 refills | Status: DC

## 2018-04-21 MED ORDER — BUDESONIDE-FORMOTEROL FUMARATE 160-4.5 MCG/ACT IN AERO: 2.0000 | INHALATION_SPRAY | Freq: Two times a day (BID) | RESPIRATORY_TRACT | 3 refills | Status: DC

## 2018-04-21 MED FILL — VIT D2 1.25 MG (50,000 UNIT: 1.25 MG | 28 days supply | Qty: 4 | Fill #0

## 2018-04-21 MED FILL — MONTELUKAST SOD 10 MG TAB: 10 | 30 days supply | Qty: 30 | Fill #0

## 2018-04-21 MED FILL — cloNIDine HCL 0.1 MG TABS: 0.1 | 30 days supply | Qty: 30 | Fill #0

## 2018-04-21 NOTE — Progress Notes (Signed)
Subjective:  Patient ID: Tanya Jordan, female    DOB: [birth year]  Age: 52 y.o. MRN: [id number]  CC: Hypertension   HPI Tanya Jordan is 52 year old female with a history of COPD, alcohol abuse, seasonal allergies, hypertension who presents today for a follow-up. She was started on clonidine for hot flashes and reports improvement in symptoms with improved sleep. She denies recent COPD exacerbation and has been compliant with her inhalers; she has cut back on cigarette smoking to 3 to 4 cigarettes/day but has smoked for the last 42 years.  Her seasonal allergies are controlled on her current regimen. Her blood pressure is better controlled today and she denies being stressed and is doing well on her antihypertensive. She denies chest pains, shortness of breath or any additional concerns.  Past Medical History:  Diagnosis Date  . Allergy   . COPD (chronic obstructive pulmonary disease) ([place])   . Family history of breast cancer   . Hypertension   . Personal history of colonic polyps     Past Surgical History:  Procedure Laterality Date  . REVISION OF SCAR ON FACE/HEAD      No Known Allergies   Outpatient Medications Prior to Visit  Medication Sig Dispense Refill  . albuterol (PROVENTIL HFA;VENTOLIN HFA) 108 (90 Base) MCG/ACT inhaler Inhale 2 puffs into the lungs every 4 (four) hours as needed for wheezing or shortness of breath. 18 g 6  . albuterol (PROVENTIL) (2.5 MG/3ML) 0.083% nebulizer solution Take 3 mLs (2.5 mg total) by nebulization every 6 (six) hours as needed for wheezing or shortness of breath. (Patient not taking: Reported on 10/16/2017) 75 mL 6  . amLODipine (NORVASC) 10 MG tablet Take 1 tablet (10 mg total) by mouth daily. 30 tablet 6  . amoxicillin (AMOXIL) 500 MG capsule Take 1 capsule (500 mg total) by mouth 3 (three) times daily. (Patient not taking: Reported on 04/21/2018) 30 capsule 0  . cetirizine (ZYRTEC) 10 MG tablet TAKE 1 TABLET (10 MG TOTAL) BY MOUTH  DAILY. 30 tablet 1  . fluticasone (FLONASE) 50 MCG/ACT nasal spray Place 2 sprays into both nostrils daily. 16 g 6  . lisinopril (PRINIVIL,ZESTRIL) 5 MG tablet Take 1 tablet (5 mg total) by mouth daily. 30 tablet 6  . mupirocin cream (BACTROBAN) 2 % Apply 1 application topically 2 (two) times daily. Apply to right ear (Patient not taking: Reported on 10/16/2017) 30 g 1  . nicotine (NICODERM CQ) 21 mg/24hr patch Place 1 patch (21 mg total) onto the skin daily. (Patient not taking: Reported on 10/16/2017) 28 patch 0  . umeclidinium bromide (INCRUSE ELLIPTA) 62.5 MCG/INH AEPB Inhale 1 puff into the lungs daily. 90 each 3  . venlafaxine XR (EFFEXOR XR) 75 MG 24 hr capsule Take 1 capsule (75 mg total) by mouth daily with breakfast. 30 capsule 3  . budesonide-formoterol (SYMBICORT) 160-4.5 MCG/ACT inhaler Inhale 2 puffs into the lungs 2 (two) times daily. 3 Inhaler 3  . cloNIDine (CATAPRES) 0.1 MG tablet Take 1 tablet (0.1 mg total) by mouth at bedtime. For hot flashes 30 tablet 3  . ergocalciferol (DRISDOL) 50000 units capsule Take 1 capsule (50,000 Units total) by mouth once a week. 4 capsule 1  . montelukast (SINGULAIR) 10 MG tablet Take 1 tablet (10 mg total) by mouth at bedtime. 30 tablet 3   Facility-Administered Medications Prior to Visit  Medication Dose Route Frequency Provider Last Rate Last Dose  . 0.9 %  sodium chloride infusion  500 mL Intravenous Continuous  Milus Banister, MD        ROS Review of Systems  Constitutional: Negative for activity change, appetite change and fatigue.  HENT: Negative for congestion, sinus pressure and sore throat.   Eyes: Negative for visual disturbance.  Respiratory: Negative for cough, chest tightness, shortness of breath and wheezing.   Cardiovascular: Negative for chest pain and palpitations.  Gastrointestinal: Negative for abdominal distention, abdominal pain and constipation.  Endocrine: Negative for polydipsia.  Genitourinary: Negative for  dysuria and frequency.  Musculoskeletal: Negative for arthralgias and back pain.  Skin: Negative for rash.  Neurological: Negative for tremors, light-headedness and numbness.  Hematological: Does not bruise/bleed easily.  Psychiatric/Behavioral: Negative for agitation and behavioral problems.    Objective:  BP 132/79   Pulse 79   Temp 98.3 F (36.8 C) (Oral)   Ht 5\' 4"  (1.626 m)   Wt 127 lb 6.4 oz (57.8 kg)   LMP 03/29/2011   SpO2 97%   BMI 21.87 kg/m   BP/Weight 04/21/2018 01/21/2018 09/29/2535  Systolic BP 644 034 742  Diastolic BP 79 81 85  Wt. (Lbs) 127.4 126.2 128.6  BMI 21.87 21.66 22.07    Physical Exam  Constitutional: She is oriented to person, place, and time. She appears well-developed and well-nourished.  Cardiovascular: Normal rate, normal heart sounds and intact distal pulses.  No murmur heard. Pulmonary/Chest: Effort normal and breath sounds normal. She has no wheezes. She has no rales. She exhibits no tenderness.  Abdominal: Soft. Bowel sounds are normal. She exhibits no distension and no mass. There is no tenderness.  Musculoskeletal: Normal range of motion.  Neurological: She is alert and oriented to person, place, and time.  Skin: Skin is warm and dry.  Psychiatric: She has a normal mood and affect.     Assessment & Plan:   1. Seasonal allergic rhinitis due to other allergic trigger Controlled - montelukast (SINGULAIR) 10 MG tablet; Take 1 tablet (10 mg total) by mouth at bedtime.  Dispense: 30 tablet; Refill: 3  2. Menopause Stable - cloNIDine (CATAPRES) 0.1 MG tablet; Take 1 tablet (0.1 mg total) by mouth at bedtime. For hot flashes  Dispense: 30 tablet; Refill: 3  3. COPD with chronic bronchitis ([place]) No recent exacerbation Advised to smoking cessation will retard progression of condition - budesonide-formoterol (SYMBICORT) 160-4.5 MCG/ACT inhaler; Inhale 2 puffs into the lungs 2 (two) times daily.  Dispense: 3 Inhaler; Refill: 3 - CT CHEST  LUNG CA SCREEN LOW DOSE W/O CM; Future  4. Tobacco abuse She has a 42-pack year history - CT CHEST LUNG CA SCREEN LOW DOSE W/O CM; Future  5. Vitamin D deficiency Currently on Drisdol  6. Essential hypertension Controlled Low sodium, DASH diet Continue amlodipine  Meds ordered this encounter  Medications  . montelukast (SINGULAIR) 10 MG tablet    Sig: Take 1 tablet (10 mg total) by mouth at bedtime.    Dispense:  30 tablet    Refill:  3  . cloNIDine (CATAPRES) 0.1 MG tablet    Sig: Take 1 tablet (0.1 mg total) by mouth at bedtime. For hot flashes    Dispense:  30 tablet    Refill:  3  . budesonide-formoterol (SYMBICORT) 160-4.5 MCG/ACT inhaler    Sig: Inhale 2 puffs into the lungs 2 (two) times daily.    Dispense:  3 Inhaler    Refill:  3  . ergocalciferol (DRISDOL) 50000 units capsule    Sig: Take 1 capsule (50,000 Units total) by mouth once a  week.    Dispense:  4 capsule    Refill:  1    Follow-up: Return in about 3 months (around 07/22/2018) for Follow-up of chronic medical conditions.   Charlott Rakes MD

## 2018-04-21 NOTE — Patient Instructions (Signed)
Vitamin D Deficiency Vitamin D deficiency is when your body does not have enough vitamin D. Vitamin D is important because:  It helps your body use other minerals that your body needs.  It helps keep your bones strong and healthy.  It may help to prevent some diseases.  It helps your heart and other muscles work well.  You can get vitamin D by:  Eating foods with vitamin D in them.  Drinking or eating milk or other foods that have had vitamin D added to them.  Taking a vitamin D supplement.  Being in the sun.  Not getting enough vitamin D can make your bones become soft. It can also cause other health problems. Follow these instructions at home:  Take medicines and supplements only as told by your doctor.  Eat foods that have vitamin D. These include: ? Dairy products, cereals, or juices with added vitamin D. Check the label for vitamin D. ? Fatty fish like salmon or trout. ? Eggs. ? Oysters.  Do not use tanning beds.  Stay at a healthy weight. Lose weight, if needed.  Keep all follow-up visits as told by your doctor. This is important. Contact a doctor if:  Your symptoms do not go away.  You feel sick to your stomach (nauseous).  Youthrow up (vomit).  You poop less often than usual or you have trouble pooping (constipation). This information is not intended to replace advice given to you by your health care provider. Make sure you discuss any questions you have with your health care provider. Document Released: 11/08/2011 Document Revised: 04/26/2016 Document Reviewed: 04/06/2015 Elsevier Interactive Patient Education  [employer].

## 2018-04-22 ENCOUNTER — Encounter: Payer: Self-pay | Admitting: Family Medicine

## 2018-04-29 MED FILL — VENLAFAXINE HCL ER 75 MG CA: 75 | 30 days supply | Qty: 30 | Fill #3

## 2018-04-29 MED FILL — AMLODIPINE BESYLATE 10 MG T: 10 | 30 days supply | Qty: 30 | Fill #5

## 2018-05-02 ENCOUNTER — Ambulatory Visit (HOSPITAL_COMMUNITY)
Admission: RE | Admit: 2018-05-02 | Discharge: 2018-05-02 | Disposition: A | Discharge status: Home / Self Care | Payer: Self-pay | Source: Ambulatory Visit | Attending: Family Medicine | Admitting: Family Medicine

## 2018-05-02 DIAGNOSIS — Z720 Tobacco use: Secondary | ICD-10-CM | POA: Insufficient documentation

## 2018-05-02 DIAGNOSIS — I2510 Atherosclerotic heart disease of native coronary artery without angina pectoris: Secondary | ICD-10-CM | POA: Insufficient documentation

## 2018-05-02 DIAGNOSIS — J439 Emphysema, unspecified: Secondary | ICD-10-CM | POA: Insufficient documentation

## 2018-05-02 DIAGNOSIS — Z122 Encounter for screening for malignant neoplasm of respiratory organs: Secondary | ICD-10-CM | POA: Insufficient documentation

## 2018-05-02 DIAGNOSIS — I700 Atherosclerosis of aorta: Secondary | ICD-10-CM | POA: Insufficient documentation

## 2018-05-02 DIAGNOSIS — J449 Chronic obstructive pulmonary disease, unspecified: Secondary | ICD-10-CM

## 2018-05-07 ENCOUNTER — Telehealth: Payer: Self-pay

## 2018-05-07 MED FILL — !VENTOLIN HFA INHALER: 108 (90 BAS | 16 days supply | Qty: 18 | Fill #6

## 2018-05-07 NOTE — Telephone Encounter (Signed)
Patient was called and informed of results via voicemail.

## 2018-05-30 MED FILL — $VENTOLIN HFA 18G INHALER: 108 (90 BAS | 48 days supply | Qty: 54 | Fill #0

## 2018-05-30 MED FILL — cloNIDine HCL 0.1 MG TABS: 0.1 | 30 days supply | Qty: 30 | Fill #1

## 2018-05-30 MED FILL — AMLODIPINE BESYLATE 10 MG T: 10 | 30 days supply | Qty: 30 | Fill #6

## 2018-06-30 MED FILL — MONTELUKAST SOD 10 MG TAB: 10 | 30 days supply | Qty: 30 | Fill #1

## 2018-06-30 MED FILL — cloNIDine HCL 0.1 MG TABS: 0.1 | 30 days supply | Qty: 30 | Fill #2

## 2018-06-30 MED FILL — AMLODIPINE BESYLATE 10 MG T: 10 | 30 days supply | Qty: 30 | Fill #0

## 2018-06-30 MED FILL — !SYMBICORT 160-4.5 MCG INH: 160-4.5 | 30 days supply | Qty: 1 | Fill #0

## 2018-07-10 MED FILL — $VENTOLIN HFA 18G INHALER: 108 (90 BAS | 48 days supply | Qty: 54 | Fill #1

## 2018-07-22 ENCOUNTER — Ambulatory Visit: Payer: Self-pay | Attending: Family Medicine | Admitting: Family Medicine

## 2018-07-22 ENCOUNTER — Encounter: Payer: Self-pay | Admitting: Family Medicine

## 2018-07-22 VITALS — BP 104/68 | HR 76 | Temp 98.1°F | Ht 64.0 in | Wt 128.8 lb

## 2018-07-22 DIAGNOSIS — Z888 Allergy status to other drugs, medicaments and biological substances status: Secondary | ICD-10-CM | POA: Insufficient documentation

## 2018-07-22 DIAGNOSIS — Z803 Family history of malignant neoplasm of breast: Secondary | ICD-10-CM | POA: Insufficient documentation

## 2018-07-22 DIAGNOSIS — N3941 Urge incontinence: Secondary | ICD-10-CM

## 2018-07-22 DIAGNOSIS — Z9889 Other specified postprocedural states: Secondary | ICD-10-CM | POA: Insufficient documentation

## 2018-07-22 DIAGNOSIS — Z79899 Other long term (current) drug therapy: Secondary | ICD-10-CM | POA: Insufficient documentation

## 2018-07-22 DIAGNOSIS — Z7901 Long term (current) use of anticoagulants: Secondary | ICD-10-CM | POA: Insufficient documentation

## 2018-07-22 DIAGNOSIS — J4489 Other specified chronic obstructive pulmonary disease: Secondary | ICD-10-CM

## 2018-07-22 DIAGNOSIS — J449 Chronic obstructive pulmonary disease, unspecified: Secondary | ICD-10-CM | POA: Insufficient documentation

## 2018-07-22 DIAGNOSIS — N951 Menopausal and female climacteric states: Secondary | ICD-10-CM | POA: Insufficient documentation

## 2018-07-22 DIAGNOSIS — Z7952 Long term (current) use of systemic steroids: Secondary | ICD-10-CM | POA: Insufficient documentation

## 2018-07-22 DIAGNOSIS — I10 Essential (primary) hypertension: Secondary | ICD-10-CM

## 2018-07-22 DIAGNOSIS — Z792 Long term (current) use of antibiotics: Secondary | ICD-10-CM | POA: Insufficient documentation

## 2018-07-22 DIAGNOSIS — E559 Vitamin D deficiency, unspecified: Secondary | ICD-10-CM

## 2018-07-22 DIAGNOSIS — Z780 Asymptomatic menopausal state: Secondary | ICD-10-CM

## 2018-07-22 DIAGNOSIS — Z09 Encounter for follow-up examination after completed treatment for conditions other than malignant neoplasm: Secondary | ICD-10-CM | POA: Insufficient documentation

## 2018-07-22 MED ORDER — BUDESONIDE-FORMOTEROL FUMARATE 160-4.5 MCG/ACT IN AERO: 2.0000 | INHALATION_SPRAY | Freq: Two times a day (BID) | RESPIRATORY_TRACT | 6 refills | Status: DC

## 2018-07-22 MED ORDER — AMLODIPINE BESYLATE 10 MG PO TABS: 10.0000 mg | ORAL_TABLET | Freq: Every day | ORAL | 6 refills | Status: DC

## 2018-07-22 MED ORDER — CLONIDINE HCL 0.1 MG PO TABS: 0.1000 mg | ORAL_TABLET | Freq: Every day | ORAL | 6 refills | Status: DC

## 2018-07-22 MED ORDER — VENLAFAXINE HCL ER 75 MG PO CP24: 75.0000 mg | ORAL_CAPSULE | Freq: Every day | ORAL | 6 refills | Status: DC

## 2018-07-22 MED ORDER — OXYBUTYNIN CHLORIDE 5 MG PO TABS: 5.0000 mg | ORAL_TABLET | Freq: Two times a day (BID) | ORAL | 3 refills | Status: DC

## 2018-07-22 MED FILL — VENLAFAXINE HCL ER 75 MG CA: 75 | 30 days supply | Qty: 30 | Fill #0

## 2018-07-22 MED FILL — OXYBUTYNIN 5 MG TABLET: 5 | 30 days supply | Qty: 60 | Fill #0

## 2018-07-22 NOTE — Progress Notes (Signed)
Subjective:  Patient ID: Tanya Jordan, female    DOB: 10/12/1966  Age: 52 y.o. MRN: [id number]  CC: Hypertension   HPI Tanya Jordan is 52 year old female with a history of COPD, alcohol abuse, seasonal allergies, hypertension who presents today for a follow-up. She complains of urge incontinence and sometimes has accidents on herself prior to making it to the bathroom.  She denies dysuria, flank pain, nausea vomiting. Her COPD has been stable with no recent exacerbations and she is doing well on her antihypertensive. Menopausal symptoms are stable on clonidine and Effexor. 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 She has no additional concerns at this time and continues to smoke.  Past Medical History:  Diagnosis Date  . Allergy   . COPD (chronic obstructive pulmonary disease) ([place])   . Family history of breast cancer   . Hypertension   . Personal history of colonic polyps     Past Surgical History:  Procedure Laterality Date  . REVISION OF SCAR ON FACE/HEAD      Allergies  Allergen Reactions  . Lisinopril Swelling    Outpatient Medications Prior to Visit  Medication Sig Dispense Refill  . cetirizine (ZYRTEC) 10 MG tablet TAKE 1 TABLET (10 MG TOTAL) BY MOUTH DAILY. 30 tablet 1  . ergocalciferol (DRISDOL) 50000 units capsule Take 1 capsule (50,000 Units total) by mouth once a week. 4 capsule 1  . fluticasone (FLONASE) 50 MCG/ACT nasal spray Place 2 sprays into both nostrils daily. 16 g 6  . montelukast (SINGULAIR) 10 MG tablet Take 1 tablet (10 mg total) by mouth at bedtime. 30 tablet 3  . umeclidinium bromide (INCRUSE ELLIPTA) 62.5 MCG/INH AEPB Inhale 1 puff into the lungs daily. 90 each 3  . amLODipine (NORVASC) 10 MG tablet Take 1 tablet (10 mg total) by mouth daily. 30 tablet 6  .  budesonide-formoterol (SYMBICORT) 160-4.5 MCG/ACT inhaler Inhale 2 puffs into the lungs 2 (two) times daily. 3 Inhaler 3  . cloNIDine (CATAPRES) 0.1 MG tablet Take 1 tablet (0.1 mg total) by mouth at bedtime. For hot flashes 30 tablet 3  . venlafaxine XR (EFFEXOR XR) 75 MG 24 hr capsule Take 1 capsule (75 mg total) by mouth daily with breakfast. 30 capsule 3  . albuterol (PROVENTIL HFA;VENTOLIN HFA) 108 (90 Base) MCG/ACT inhaler Inhale 2 puffs into the lungs every 4 (four) hours as needed for wheezing or shortness of breath. 18 g 6  . albuterol (PROVENTIL) (2.5 MG/3ML) 0.083% nebulizer solution Take 3 mLs (2.5 mg total) by nebulization every 6 (six) hours as needed for wheezing or shortness of breath. (Patient not taking: Reported on 10/16/2017) 75 mL 6  . amoxicillin (AMOXIL) 500 MG capsule Take 1 capsule (500 mg total) by mouth 3 (three) times daily. (Patient not taking: Reported on 04/21/2018) 30 capsule 0  . mupirocin cream (BACTROBAN) 2 % Apply 1 application topically 2 (two) times daily. Apply to right ear (Patient not taking: Reported on 10/16/2017) 30 g 1  . nicotine (NICODERM CQ) 21 mg/24hr patch Place 1 patch (21 mg total) onto the skin daily. (Patient not taking: Reported on 10/16/2017) 28 patch 0  . lisinopril (PRINIVIL,ZESTRIL) 5 MG tablet Take 1 tablet (5 mg total) by mouth daily. (Patient not taking: Reported on 07/22/2018) 30 tablet 6  Facility-Administered Medications Prior to Visit  Medication Dose Route Frequency Provider Last Rate Last Dose  . 0.9 %  sodium chloride infusion  500 mL Intravenous Continuous Milus Banister, MD        ROS Review of Systems  Constitutional: Negative for activity change, appetite change and fatigue.  HENT: Negative for congestion, sinus pressure and sore throat.   Eyes: Negative for visual disturbance.  Respiratory: Negative for cough, chest tightness, shortness of breath and wheezing.   Cardiovascular: Negative for chest pain and palpitations.    Gastrointestinal: Negative for abdominal distention, abdominal pain and constipation.  Endocrine: Negative for polydipsia.  Genitourinary: Positive for urgency. Negative for dysuria and frequency.  Musculoskeletal: Negative for arthralgias and back pain.  Skin: Negative for rash.  Neurological: Negative for tremors, light-headedness and numbness.  Hematological: Does not bruise/bleed easily.  Psychiatric/Behavioral: Negative for agitation and behavioral problems.    Objective:  BP 104/68   Pulse 76   Temp 98.1 F (36.7 C) (Oral)   Ht 5' 4"  (1.626 m)   Wt 128 lb 12.8 oz (58.4 kg)   LMP 03/29/2011   SpO2 97%   BMI 22.11 kg/m   BP/Weight 07/22/2018 04/21/2018 5/64/3329  Systolic BP 518 841 660  Diastolic BP 68 79 81  Wt. (Lbs) 128.8 127.4 126.2  BMI 22.11 21.87 21.66     Physical Exam  Constitutional: She is oriented to person, place, and time. She appears well-developed and well-nourished.  Cardiovascular: Normal rate, normal heart sounds and intact distal pulses.  No murmur heard. Pulmonary/Chest: Effort normal and breath sounds normal. She has no wheezes. She has no rales. She exhibits no tenderness.  Abdominal: Soft. Bowel sounds are normal. She exhibits no distension and no mass. There is no tenderness.  Musculoskeletal: Normal range of motion.  Neurological: She is alert and oriented to person, place, and time.  Skin: Skin is warm and dry.  Psychiatric: She has a normal mood and affect.     Assessment & Plan:   1. Urge incontinence Placed on oxybutynin  2. Vitamin D deficiency Completed a course of Drisdol - VITAMIN D 25 Hydroxy (Vit-D Deficiency, Fractures); Future  3. Menopause Stable - venlafaxine XR (EFFEXOR XR) 75 MG 24 hr capsule; Take 1 capsule (75 mg total) by mouth daily with breakfast.  Dispense: 30 capsule; Refill: 6 - cloNIDine (CATAPRES) 0.1 MG tablet; Take 1 tablet (0.1 mg total) by mouth at bedtime. For hot flashes  Dispense: 30 tablet; Refill:  6  4. COPD with chronic bronchitis ([place]) No recent exacerbations Advised to smoking cessation will retard progression of condition - budesonide-formoterol (SYMBICORT) 160-4.5 MCG/ACT inhaler; Inhale 2 puffs into the lungs 2 (two) times daily.  Dispense: 3 Inhaler; Refill: 6  5. Essential hypertension Controlled - CMP14+EGFR; Future - Lipid panel; Future - amLODipine (NORVASC) 10 MG tablet; Take 1 tablet (10 mg total) by mouth daily.  Dispense: 30 tablet; Refill: 6   Meds ordered this encounter  Medications  . venlafaxine XR (EFFEXOR XR) 75 MG 24 hr capsule    Sig: Take 1 capsule (75 mg total) by mouth daily with breakfast.    Dispense:  30 capsule    Refill:  6  . cloNIDine (CATAPRES) 0.1 MG tablet    Sig: Take 1 tablet (0.1 mg total) by mouth at bedtime. For hot flashes    Dispense:  30 tablet    Refill:  6  . budesonide-formoterol (SYMBICORT) 160-4.5 MCG/ACT inhaler    Sig: Inhale 2 puffs  into the lungs 2 (two) times daily.    Dispense:  3 Inhaler    Refill:  6  . amLODipine (NORVASC) 10 MG tablet    Sig: Take 1 tablet (10 mg total) by mouth daily.    Dispense:  30 tablet    Refill:  6  . oxybutynin (DITROPAN) 5 MG tablet    Sig: Take 1 tablet (5 mg total) by mouth 2 (two) times daily.    Dispense:  60 tablet    Refill:  3    Follow-up: Return in about 3 months (around 10/22/2018) for Follow-up of chronic medical conditions.   Charlott Rakes MD

## 2018-07-22 NOTE — Patient Instructions (Signed)

## 2018-07-30 MED FILL — MONTELUKAST SOD 10 MG TAB: 10 | 30 days supply | Qty: 30 | Fill #2

## 2018-07-30 MED FILL — AMLODIPINE BESYLATE 10 MG T: 10 | 30 days supply | Qty: 30 | Fill #1

## 2018-08-01 ENCOUNTER — Ambulatory Visit: Payer: Self-pay | Attending: Family Medicine

## 2018-08-01 DIAGNOSIS — E559 Vitamin D deficiency, unspecified: Secondary | ICD-10-CM | POA: Insufficient documentation

## 2018-08-01 DIAGNOSIS — I10 Essential (primary) hypertension: Secondary | ICD-10-CM | POA: Insufficient documentation

## 2018-08-01 NOTE — Progress Notes (Signed)
Patient here for lab visit only 

## 2018-08-02 LAB — LIPID PANEL
CHOL/HDL RATIO: 3 ratio (ref 0.0–4.4)
Cholesterol, Total: 189 mg/dL (ref 100–199)
HDL: 64 mg/dL (ref >39)
LDL CALC: 113 mg/dL — AB (ref 0–99)
TRIGLYCERIDES: 60 mg/dL (ref 0–149)
VLDL CHOLESTEROL CAL: 12 mg/dL (ref 5–40)

## 2018-08-02 LAB — CMP14+EGFR
ALT: 12 IU/L (ref 0–32)
AST: 14 IU/L (ref 0–40)
Albumin/Globulin Ratio: 1.8 (ref 1.2–2.2)
Albumin: 4.6 g/dL (ref 3.5–5.5)
Alkaline Phosphatase: 88 IU/L (ref 39–117)
BUN/Creatinine Ratio: 8 — ABNORMAL LOW (ref 9–23)
BUN: 6 mg/dL (ref 6–24)
Bilirubin Total: <0.2 mg/dL (ref 0.0–1.2)
CALCIUM: 9.4 mg/dL (ref 8.7–10.2)
CO2: 23 mmol/L (ref 20–29)
CREATININE: 0.79 mg/dL (ref 0.57–1.00)
Chloride: 101 mmol/L (ref 96–106)
GFR calc Af Amer: 99 mL/min/{1.73_m2} (ref >59)
GFR, EST NON AFRICAN AMERICAN: 86 mL/min/{1.73_m2} (ref >59)
GLOBULIN, TOTAL: 2.5 g/dL (ref 1.5–4.5)
GLUCOSE: 78 mg/dL (ref 65–99)
Potassium: 4.2 mmol/L (ref 3.5–5.2)
Sodium: 142 mmol/L (ref 134–144)
Total Protein: 7.1 g/dL (ref 6.0–8.5)

## 2018-08-02 LAB — VITAMIN D 25 HYDROXY (VIT D DEFICIENCY, FRACTURES): Vit D, 25-Hydroxy: 20.4 ng/mL — ABNORMAL LOW (ref 30.0–100.0)

## 2018-08-05 ENCOUNTER — Other Ambulatory Visit: Payer: Self-pay | Admitting: Obstetrics and Gynecology

## 2018-08-05 ENCOUNTER — Other Ambulatory Visit: Payer: Self-pay | Admitting: Family Medicine

## 2018-08-05 ENCOUNTER — Telehealth: Payer: Self-pay

## 2018-08-05 DIAGNOSIS — Z1231 Encounter for screening mammogram for malignant neoplasm of breast: Secondary | ICD-10-CM

## 2018-08-05 MED ORDER — ERGOCALCIFEROL 1.25 MG (50000 UT) PO CAPS: 50000.0000 [IU] | ORAL_CAPSULE | ORAL | 1 refills | Status: DC

## 2018-08-05 NOTE — Telephone Encounter (Signed)
-----   Message from Charlott Rakes, MD sent at 08/05/2018  8:19 AM EDT ----- Vitamin D level is still low. I have refilled her Drisdol.Cholesterol is stable.

## 2018-08-05 NOTE — Telephone Encounter (Signed)
Patient was called and informed of lab results via voicemail.

## 2018-08-12 MED FILL — cloNIDine HCL 0.1 MG TABS: 0.1 | 30 days supply | Qty: 30 | Fill #3

## 2018-08-15 MED FILL — VIT D2 1.25 MG (50,000 UNIT: 1.25 MG | 28 days supply | Qty: 4 | Fill #0

## 2018-09-02 MED FILL — AMLODIPINE BESYLATE 10 MG T: 10 | 30 days supply | Qty: 30 | Fill #2

## 2018-09-02 MED FILL — MONTELUKAST SOD 10 MG TAB: 10 | 30 days supply | Qty: 30 | Fill #3

## 2018-09-08 MED FILL — cloNIDine HCL 0.1 MG TABS: 0.1 | 30 days supply | Qty: 30 | Fill #0

## 2018-09-08 MED FILL — ALBUTEROL SULFATE HFA 108 (: 108 (90 BAS | 16 days supply | Qty: 18 | Fill #2

## 2018-09-12 MED FILL — SYMBICORT 160-4.5 MCG INH: 160-4.5 | 30 days supply | Qty: 10 | Fill #0

## 2018-09-18 MED FILL — VIT D2 1.25 MG (50,000 UNIT: 1.25 MG | 28 days supply | Qty: 4 | Fill #1

## 2018-09-30 ENCOUNTER — Ambulatory Visit (HOSPITAL_COMMUNITY)
Admission: RE | Admit: 2018-09-30 | Discharge: 2018-09-30 | Disposition: A | Discharge status: Home / Self Care | Payer: Self-pay | Source: Ambulatory Visit | Attending: Obstetrics and Gynecology | Admitting: Obstetrics and Gynecology

## 2018-09-30 ENCOUNTER — Ambulatory Visit
Admission: RE | Admit: 2018-09-30 | Discharge: 2018-09-30 | Disposition: A | Discharge status: Home / Self Care | Payer: Self-pay | Source: Ambulatory Visit | Attending: Obstetrics and Gynecology | Admitting: Obstetrics and Gynecology

## 2018-09-30 ENCOUNTER — Encounter (HOSPITAL_COMMUNITY): Payer: Self-pay

## 2018-09-30 VITALS — BP 122/76 | Ht 64.0 in | Wt 126.0 lb

## 2018-09-30 DIAGNOSIS — Z1231 Encounter for screening mammogram for malignant neoplasm of breast: Secondary | ICD-10-CM

## 2018-09-30 DIAGNOSIS — Z1239 Encounter for other screening for malignant neoplasm of breast: Secondary | ICD-10-CM

## 2018-09-30 NOTE — Patient Instructions (Addendum)
Explained breast self awareness with [name]. Patient did not need a Pap smear today due to last Pap smear and HPV was 07/11/2017. Let her know [facility] will cover Pap smears and HPV Typing every 5 years unless has a history of abnormal Pap smears. Referred patient to the [facility] for a screening mammogram. Appointment scheduled for Tuesday, September 30, 2018 at 1600. Let patient know the [facility] will follow up with her within the next couple weeks with results of mammogram by letter or phone. Discussed smoking cessation with patient. Referred patient to the [facility] Quitline and gave resources to the free smoking cessation classes at [facility]. Kieth Brightly Rainone verbalized understanding.  Kieffer Blatz, Arvil Chaco, RN 2:38 PM

## 2018-09-30 NOTE — Progress Notes (Signed)
No complaints today.   Pap Smear: Pap smear not completed today. Last Pap smear was 07/11/2017 at [facility] and normal with negative HPV. Per patient has no history of an abnormal Pap smear. Last Pap smear result is in EPIC.  Physical exam: Breasts Breasts symmetrical. No skin abnormalities bilateral breasts. No nipple retraction bilateral breasts. No nipple discharge bilateral breasts. No lymphadenopathy. No lumps palpated bilateral breasts. No complaints of pain or tenderness on exam. Referred patient to the [place] for a screening mammogram. Appointment scheduled for Tuesday, September 30, 2018 at 1600.        Pelvic/Bimanual No Pap smear completed today since last Pap smear and HPV typing was 07/11/2017. Pap smear not indicated per [facility] guidelines.   Smoking History: Patient is a current smoker. Discussed smoking cessation with patient. Referred patient to the [facility] Quitline and gave resources to the free smoking cessation classes at [facility].  Patient Navigation: Patient education provided. Access to services provided for patient through [place] program.   Colorectal Cancer Screening: Patient had a colonoscopy completed 09/13/2017. No complaints today. FIT Test given to patient to complete and return to [facility].  Breast and Cervical Cancer Risk Assessment: Patient has a family history of her maternal grandmother having breast cancer. Patient has no known genetic mutations or history of radiation treatment to the chest before age 65. Patient has no history of cervical dysplasia, immunocompromised, or DES exposure in-utero.  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

## 2018-10-01 MED FILL — AMLODIPINE BESYLATE 10 MG T: 10 | 30 days supply | Qty: 30 | Fill #3

## 2018-10-06 ENCOUNTER — Encounter (HOSPITAL_COMMUNITY): Payer: Self-pay | Admitting: *Deleted

## 2018-10-06 ENCOUNTER — Other Ambulatory Visit: Payer: Self-pay | Admitting: Family Medicine

## 2018-10-06 DIAGNOSIS — J449 Chronic obstructive pulmonary disease, unspecified: Secondary | ICD-10-CM

## 2018-10-06 MED FILL — !VENTOLIN HFA INHALER: 108 (90 BAS | 25 days supply | Qty: 18 | Fill #0

## 2018-10-13 MED FILL — cloNIDine HCL 0.1 MG TABS: 0.1 | 30 days supply | Qty: 30 | Fill #1

## 2018-10-13 MED FILL — MONTELUKAST SOD 10 MG TAB: 10 | 30 days supply | Qty: 30 | Fill #2

## 2018-10-21 ENCOUNTER — Ambulatory Visit: Payer: Self-pay | Attending: Family Medicine | Admitting: Family Medicine

## 2018-10-21 ENCOUNTER — Encounter: Payer: Self-pay | Admitting: Family Medicine

## 2018-10-21 ENCOUNTER — Ambulatory Visit: Payer: Self-pay | Admitting: Family Medicine

## 2018-10-21 VITALS — BP 119/73 | HR 70 | Temp 98.3°F | Ht 64.0 in | Wt 125.4 lb

## 2018-10-21 DIAGNOSIS — Z8249 Family history of ischemic heart disease and other diseases of the circulatory system: Secondary | ICD-10-CM | POA: Insufficient documentation

## 2018-10-21 DIAGNOSIS — Z79899 Other long term (current) drug therapy: Secondary | ICD-10-CM | POA: Insufficient documentation

## 2018-10-21 DIAGNOSIS — Z7951 Long term (current) use of inhaled steroids: Secondary | ICD-10-CM | POA: Insufficient documentation

## 2018-10-21 DIAGNOSIS — E559 Vitamin D deficiency, unspecified: Secondary | ICD-10-CM | POA: Insufficient documentation

## 2018-10-21 DIAGNOSIS — F17210 Nicotine dependence, cigarettes, uncomplicated: Secondary | ICD-10-CM | POA: Insufficient documentation

## 2018-10-21 DIAGNOSIS — Z888 Allergy status to other drugs, medicaments and biological substances status: Secondary | ICD-10-CM | POA: Insufficient documentation

## 2018-10-21 DIAGNOSIS — J449 Chronic obstructive pulmonary disease, unspecified: Secondary | ICD-10-CM | POA: Insufficient documentation

## 2018-10-21 DIAGNOSIS — J302 Other seasonal allergic rhinitis: Secondary | ICD-10-CM | POA: Insufficient documentation

## 2018-10-21 DIAGNOSIS — I10 Essential (primary) hypertension: Secondary | ICD-10-CM | POA: Insufficient documentation

## 2018-10-21 DIAGNOSIS — G8929 Other chronic pain: Secondary | ICD-10-CM | POA: Insufficient documentation

## 2018-10-21 DIAGNOSIS — M25511 Pain in right shoulder: Secondary | ICD-10-CM | POA: Insufficient documentation

## 2018-10-21 DIAGNOSIS — Z780 Asymptomatic menopausal state: Secondary | ICD-10-CM | POA: Insufficient documentation

## 2018-10-21 DIAGNOSIS — J3089 Other allergic rhinitis: Secondary | ICD-10-CM

## 2018-10-21 MED ORDER — ALBUTEROL SULFATE HFA 108 (90 BASE) MCG/ACT IN AERS: 2.0000 | INHALATION_SPRAY | RESPIRATORY_TRACT | 2 refills | Status: DC | PRN

## 2018-10-21 MED ORDER — FLUTICASONE PROPIONATE 50 MCG/ACT NA SUSP: 2.0000 | Freq: Every day | NASAL | 6 refills | Status: DC

## 2018-10-21 MED ORDER — AMLODIPINE BESYLATE 10 MG PO TABS: 10.0000 mg | ORAL_TABLET | Freq: Every day | ORAL | 6 refills | Status: DC

## 2018-10-21 MED ORDER — UMECLIDINIUM BROMIDE 62.5 MCG/INH IN AEPB: 1.0000 | INHALATION_SPRAY | Freq: Every day | RESPIRATORY_TRACT | 3 refills | Status: DC

## 2018-10-21 MED ORDER — ERGOCALCIFEROL 1.25 MG (50000 UT) PO CAPS: 50000.0000 [IU] | ORAL_CAPSULE | ORAL | 1 refills | Status: DC

## 2018-10-21 MED ORDER — CLONIDINE HCL 0.1 MG PO TABS: 0.1000 mg | ORAL_TABLET | Freq: Every day | ORAL | 6 refills | Status: DC

## 2018-10-21 MED ORDER — VENLAFAXINE HCL ER 75 MG PO CP24: 75.0000 mg | ORAL_CAPSULE | Freq: Every day | ORAL | 6 refills | Status: DC

## 2018-10-21 MED ORDER — ALBUTEROL SULFATE (2.5 MG/3ML) 0.083% IN NEBU: 2.5000 mg | INHALATION_SOLUTION | Freq: Four times a day (QID) | RESPIRATORY_TRACT | 6 refills | Status: DC | PRN

## 2018-10-21 MED ORDER — BUDESONIDE-FORMOTEROL FUMARATE 160-4.5 MCG/ACT IN AERO: 2.0000 | INHALATION_SPRAY | Freq: Two times a day (BID) | RESPIRATORY_TRACT | 6 refills | Status: DC

## 2018-10-21 NOTE — Progress Notes (Signed)
Subjective:  Patient ID: Tanya Jordan, female    DOB: September 16, 1966  Age: 52 y.o. MRN: [id number]  CC: Hypertension   HPI Tanya Jordan is 52 year old female with a history of COPD, alcohol abuse, tobacco abuse, seasonal allergies, hypertension who presents today for a follow-up. Due to urge urinary incontinence she was placed on oxybutynin at her last office visit which she informs me she was unable to tolerate as it made her sedated throughout the whole day. She denies recent COPD exacerbation and her symptoms are controlled on her current inhalers however she does have a chronic cough and dyspnea only occurs on moderate exertion.  She continues to smoke but states she has reduced due to the cold weather and being unable to go outside to smoke.  Currently smokes 5 cigarettes/day and is not interested in the patch other forms of nicotine replacement therapy but informs me she was invited to a seminar plan information sheet from the breast center to help her quit smoking. Doing well on her antihypertensive. She has intermittent right shoulder and right wrist pain which have worsened with the cold weather and she uses OTC Tylenol with some relief.  Past Medical History:  Diagnosis Date  . Allergy   . COPD (chronic obstructive pulmonary disease) ([place])   . Family history of breast cancer   . Hypertension   . Personal history of colonic polyps     Past Surgical History:  Procedure Laterality Date  . BREAST BIOPSY Left   . REVISION OF SCAR ON FACE/HEAD      Allergies  Allergen Reactions  . Lisinopril Swelling     Outpatient Medications Prior to Visit  Medication Sig Dispense Refill  . cetirizine (ZYRTEC) 10 MG tablet TAKE 1 TABLET (10 MG TOTAL) BY MOUTH DAILY. 30 tablet 1  . montelukast (SINGULAIR) 10 MG tablet Take 1 tablet (10 mg total) by mouth at bedtime. 30 tablet 3  . albuterol (PROVENTIL HFA;VENTOLIN HFA) 108 (90 Base) MCG/ACT inhaler INHALE 2 PUFFS INTO THE LUNGS EVERY 4  (FOUR) HOURS AS NEEDED FOR WHEEZING OR SHORTNESS OF BREATH. 18 g 2  . albuterol (PROVENTIL) (2.5 MG/3ML) 0.083% nebulizer solution Take 3 mLs (2.5 mg total) by nebulization every 6 (six) hours as needed for wheezing or shortness of breath. 75 mL 6  . amLODipine (NORVASC) 10 MG tablet Take 1 tablet (10 mg total) by mouth daily. 30 tablet 6  . budesonide-formoterol (SYMBICORT) 160-4.5 MCG/ACT inhaler Inhale 2 puffs into the lungs 2 (two) times daily. 3 Inhaler 6  . cloNIDine (CATAPRES) 0.1 MG tablet Take 1 tablet (0.1 mg total) by mouth at bedtime. For hot flashes 30 tablet 6  . ergocalciferol (DRISDOL) 50000 units capsule Take 1 capsule (50,000 Units total) by mouth once a week. 4 capsule 1  . fluticasone (FLONASE) 50 MCG/ACT nasal spray Place 2 sprays into both nostrils daily. 16 g 6  . umeclidinium bromide (INCRUSE ELLIPTA) 62.5 MCG/INH AEPB Inhale 1 puff into the lungs daily. 90 each 3  . venlafaxine XR (EFFEXOR XR) 75 MG 24 hr capsule Take 1 capsule (75 mg total) by mouth daily with breakfast. 30 capsule 6  . mupirocin cream (BACTROBAN) 2 % Apply 1 application topically 2 (two) times daily. Apply to right ear (Patient not taking: Reported on 10/16/2017) 30 g 1  . nicotine (NICODERM CQ) 21 mg/24hr patch Place 1 patch (21 mg total) onto the skin daily. (Patient not taking: Reported on 10/16/2017) 28 patch 0  . amoxicillin (AMOXIL)  500 MG capsule Take 1 capsule (500 mg total) by mouth 3 (three) times daily. (Patient not taking: Reported on 04/21/2018) 30 capsule 0  . oxybutynin (DITROPAN) 5 MG tablet Take 1 tablet (5 mg total) by mouth 2 (two) times daily. (Patient not taking: Reported on 09/30/2018) 60 tablet 3   Facility-Administered Medications Prior to Visit  Medication Dose Route Frequency Provider Last Rate Last Dose  . 0.9 %  sodium chloride infusion  500 mL Intravenous Continuous Milus Banister, MD        ROS Review of Systems  Constitutional: Negative for activity change, appetite  change and fatigue.  HENT: Negative for congestion, sinus pressure and sore throat.   Eyes: Negative for visual disturbance.  Respiratory: Negative for cough, chest tightness, shortness of breath and wheezing.   Cardiovascular: Negative for chest pain and palpitations.  Gastrointestinal: Negative for abdominal distention, abdominal pain and constipation.  Endocrine: Negative for polydipsia.  Genitourinary: Negative for dysuria and frequency.  Musculoskeletal:       See hpi  Skin: Negative for rash.  Neurological: Negative for tremors, light-headedness and numbness.  Hematological: Does not bruise/bleed easily.  Psychiatric/Behavioral: Negative for agitation and behavioral problems.    Objective:  BP 119/73   Pulse 70   Temp 98.3 F (36.8 C) (Oral)   Ht 5\' 4"  (1.626 m)   Wt 125 lb 6.4 oz (56.9 kg)   LMP 03/29/2011   SpO2 99%   BMI 21.52 kg/m   BP/Weight 10/21/2018 09/30/2018 1/61/0960  Systolic BP 454 098 119  Diastolic BP 73 76 68  Wt. (Lbs) 125.4 126 128.8  BMI 21.52 21.63 22.11      Physical Exam  Constitutional: She is oriented to person, place, and time. She appears well-developed and well-nourished.  Cardiovascular: Normal rate, normal heart sounds and intact distal pulses.  No murmur heard. Pulmonary/Chest: Effort normal and breath sounds normal. She has no wheezes. She has no rales. She exhibits no tenderness.  Abdominal: Soft. Bowel sounds are normal. She exhibits no distension and no mass. There is no tenderness.  Musculoskeletal:  Anterior right shoulder tenderness on passive range of motion of right shoulder Tenderness on palpation of radial aspect of right wrist Normal range of motion in both wrist  Neurological: She is alert and oriented to person, place, and time.  Skin: Skin is warm and dry.  Psychiatric: She has a normal mood and affect.     CMP Latest Ref Rng & Units 08/01/2018 01/21/2018 06/17/2017  Glucose 65 - 99 mg/dL 78 80 70  BUN 6 - 24 mg/dL  6 12 6   Creatinine 0.57 - 1.00 mg/dL 0.79 0.77 0.86  Sodium 134 - 144 mmol/L 142 143 141  Potassium 3.5 - 5.2 mmol/L 4.2 3.8 3.6  Chloride 96 - 106 mmol/L 101 104 103  CO2 20 - 29 mmol/L 23 23 18(L)  Calcium 8.7 - 10.2 mg/dL 9.4 9.2 9.4  Total Protein 6.0 - 8.5 g/dL 7.1 6.8 -  Total Bilirubin 0.0 - 1.2 mg/dL <0.2 0.2 -  Alkaline Phos 39 - 117 IU/L 88 81 -  AST 0 - 40 IU/L 14 10 -  ALT 0 - 32 IU/L 12 11 -    Lipid Panel     Component Value Date/Time   CHOL 189 08/01/2018 0928   TRIG 60 08/01/2018 0928   HDL 64 08/01/2018 0928   CHOLHDL 3.0 08/01/2018 0928   CHOLHDL 2.8 11/16/2016 0904   VLDL 16 11/16/2016 0904   LDLCALC  113 (H) 08/01/2018 6812    Assessment & Plan:   1. Essential hypertension Controlled Counseled on blood pressure goal of less than 130/80, low-sodium, DASH diet, medication compliance, 150 minutes of moderate intensity exercise per week. Discussed medication compliance, adverse effects. - amLODipine (NORVASC) 10 MG tablet; Take 1 tablet (10 mg total) by mouth daily.  Dispense: 30 tablet; Refill: 6  2. COPD with chronic bronchitis ([facility]) No acute exacerbation Advised smoking cessation will help with her progression of conditions; she is not interested in quitting smoking at this time - albuterol (PROVENTIL HFA;VENTOLIN HFA) 108 (90 Base) MCG/ACT inhaler; Inhale 2 puffs into the lungs every 4 (four) hours as needed for wheezing or shortness of breath.  Dispense: 18 g; Refill: 2 - albuterol (PROVENTIL) (2.5 MG/3ML) 0.083% nebulizer solution; Take 3 mLs (2.5 mg total) by nebulization every 6 (six) hours as needed for wheezing or shortness of breath.  Dispense: 75 mL; Refill: 6 - budesonide-formoterol (SYMBICORT) 160-4.5 MCG/ACT inhaler; Inhale 2 puffs into the lungs 2 (two) times daily.  Dispense: 3 Inhaler; Refill: 6 - umeclidinium bromide (INCRUSE ELLIPTA) 62.5 MCG/INH AEPB; Inhale 1 puff into the lungs daily.  Dispense: 90 each; Refill: 3  3.  Menopause Stable - cloNIDine (CATAPRES) 0.1 MG tablet; Take 1 tablet (0.1 mg total) by mouth at bedtime. For hot flashes  Dispense: 30 tablet; Refill: 6 - venlafaxine XR (EFFEXOR XR) 75 MG 24 hr capsule; Take 1 capsule (75 mg total) by mouth daily with breakfast.  Dispense: 30 capsule; Refill: 6  4. Seasonal allergic rhinitis due to other allergic trigger Controlled - fluticasone (FLONASE) 50 MCG/ACT nasal spray; Place 2 sprays into both nostrils daily.  Dispense: 16 g; Refill: 6  5. Chronic right shoulder pain Advised to use OTC NSAIDs  6. Vitamin D deficiency Fatigue has improved on physical - ergocalciferol (DRISDOL) 1.25 MG (50000 UT) capsule; Take 1 capsule (50,000 Units total) by mouth once a week.  Dispense: 4 capsule; Refill: 1   Meds ordered this encounter  Medications  . ergocalciferol (DRISDOL) 1.25 MG (50000 UT) capsule    Sig: Take 1 capsule (50,000 Units total) by mouth once a week.    Dispense:  4 capsule    Refill:  1  . amLODipine (NORVASC) 10 MG tablet    Sig: Take 1 tablet (10 mg total) by mouth daily.    Dispense:  30 tablet    Refill:  6  . albuterol (PROVENTIL HFA;VENTOLIN HFA) 108 (90 Base) MCG/ACT inhaler    Sig: Inhale 2 puffs into the lungs every 4 (four) hours as needed for wheezing or shortness of breath.    Dispense:  18 g    Refill:  2  . albuterol (PROVENTIL) (2.5 MG/3ML) 0.083% nebulizer solution    Sig: Take 3 mLs (2.5 mg total) by nebulization every 6 (six) hours as needed for wheezing or shortness of breath.    Dispense:  75 mL    Refill:  6  . budesonide-formoterol (SYMBICORT) 160-4.5 MCG/ACT inhaler    Sig: Inhale 2 puffs into the lungs 2 (two) times daily.    Dispense:  3 Inhaler    Refill:  6  . cloNIDine (CATAPRES) 0.1 MG tablet    Sig: Take 1 tablet (0.1 mg total) by mouth at bedtime. For hot flashes    Dispense:  30 tablet    Refill:  6  . fluticasone (FLONASE) 50 MCG/ACT nasal spray    Sig: Place 2 sprays into both nostrils daily.  Dispense:  16 g    Refill:  6  . umeclidinium bromide (INCRUSE ELLIPTA) 62.5 MCG/INH AEPB    Sig: Inhale 1 puff into the lungs daily.    Dispense:  90 each    Refill:  3  . venlafaxine XR (EFFEXOR XR) 75 MG 24 hr capsule    Sig: Take 1 capsule (75 mg total) by mouth daily with breakfast.    Dispense:  30 capsule    Refill:  6    Follow-up: Return in about 6 months (around 04/21/2019) for Follow-up of chronic medical conditions.   Charlott Rakes MD

## 2018-10-21 NOTE — Patient Instructions (Signed)
Shoulder Pain Many things can cause shoulder pain, including:  An injury.  Moving the arm in the same way again and again (overuse).  Joint pain (arthritis).  Follow these instructions at home: Take these actions to help with your pain:  Squeeze a soft ball or a foam pad as much as you can. This helps to prevent swelling. It also makes the arm stronger.  Take over-the-counter and prescription medicines only as told by your doctor.  If told, put ice on the area: ? Put ice in a plastic bag. ? Place a towel between your skin and the bag. ? Leave the ice on for 20 minutes, 2-3 times per day. Stop putting on ice if it does not help with the pain.  If you were given a shoulder sling or immobilizer: ? Wear it as told. ? Remove it to shower or bathe. ? Move your arm as little as possible. ? Keep your hand moving. This helps prevent swelling.  Contact a doctor if:  Your pain gets worse.  Medicine does not help your pain.  You have new pain in your arm, hand, or fingers. Get help right away if:  Your arm, hand, or fingers: ? Tingle. ? Are numb. ? Are swollen. ? Are painful. ? Turn white or blue. This information is not intended to replace advice given to you by your health care provider. Make sure you discuss any questions you have with your health care provider. Document Released: 05/07/2008 Document Revised: 07/15/2016 Document Reviewed: 03/14/2015 Elsevier Interactive Patient Education  2018 [employer]  

## 2018-10-22 MED FILL — FLUTICASONE PROP 50 MCG SPR: 50 | 30 days supply | Qty: 16 | Fill #0

## 2018-10-22 MED FILL — VENLAFAXINE HCL ER 75 MG CA: 75 | 30 days supply | Qty: 30 | Fill #0

## 2018-10-22 MED FILL — SYMBICORT 160-4.5 MCG INH: 160-4.5 | 30 days supply | Qty: 10 | Fill #0

## 2018-10-22 MED FILL — ALBUTEROL SUL 2.5 MG/3 ML S: (2.5 MG/3ML | 6 days supply | Qty: 75 | Fill #0

## 2018-10-22 MED FILL — VIT D2 1.25 MG (50,000 UNIT: 1.25 MG | 28 days supply | Qty: 4 | Fill #0

## 2018-10-22 MED FILL — $INCRUSE ELLIPTA 62.5 MCG I: 62.5 MCG | 90 days supply | Qty: 90 | Fill #0

## 2018-11-03 MED FILL — AMLODIPINE BESYLATE 10 MG T: 10 | 30 days supply | Qty: 30 | Fill #4

## 2018-11-03 MED FILL — $VENTOLIN HFA 18G INHALER: 108 (90 BAS | 25 days supply | Qty: 18 | Fill #1

## 2018-11-11 MED FILL — cloNIDine HCL 0.1 MG TABS: 0.1 | 30 days supply | Qty: 30 | Fill #2

## 2018-11-11 MED FILL — MONTELUKAST SOD 10 MG TAB: 10 | 30 days supply | Qty: 30 | Fill #0

## 2018-11-27 MED FILL — AMLODIPINE BESYLATE 10 MG T: 10 | 30 days supply | Qty: 30 | Fill #5

## 2018-11-27 MED FILL — $VENTOLIN HFA 18G INHALER: 108 (90 BAS | 25 days supply | Qty: 18 | Fill #2

## 2018-12-26 MED FILL — MONTELUKAST SOD 10 MG TAB: 10 | 30 days supply | Qty: 30 | Fill #1

## 2018-12-26 MED FILL — cloNIDine HCL 0.1 MG TABS: 0.1 | 30 days supply | Qty: 30 | Fill #3

## 2018-12-26 MED FILL — AMLODIPINE BESYLATE 10 MG T: 10 | 30 days supply | Qty: 30 | Fill #6

## 2018-12-26 MED FILL — $VENTOLIN HFA 18G INHALER: 108 (90 BAS | 16 days supply | Qty: 18 | Fill #0

## 2018-12-26 MED FILL — SYMBICORT 160-4.5 MCG INH: 160-4.5 | 30 days supply | Qty: 10 | Fill #1

## 2019-01-26 ENCOUNTER — Other Ambulatory Visit: Payer: Self-pay | Admitting: Family Medicine

## 2019-01-26 DIAGNOSIS — J3089 Other allergic rhinitis: Secondary | ICD-10-CM

## 2019-01-26 MED FILL — cloNIDine HCL 0.1 MG TABS: 0.1 | 30 days supply | Qty: 30 | Fill #4

## 2019-01-26 MED FILL — MONTELUKAST SOD 10 MG TAB: 10 | 30 days supply | Qty: 30 | Fill #0

## 2019-01-26 MED FILL — AMLODIPINE BESYLATE 10 MG T: 10 | 30 days supply | Qty: 30 | Fill #0

## 2019-01-26 MED FILL — $VENTOLIN HFA 18G INHALER: 108 (90 BAS | 50 days supply | Qty: 36 | Fill #1

## 2019-01-26 MED FILL — $Symbicort 160-4.5mcg/act: 160-4.5 | 90 days supply | Qty: 3 | Fill #2

## 2019-02-25 ENCOUNTER — Other Ambulatory Visit: Payer: Self-pay | Admitting: Family Medicine

## 2019-02-25 DIAGNOSIS — J449 Chronic obstructive pulmonary disease, unspecified: Secondary | ICD-10-CM

## 2019-02-25 MED FILL — AMLODIPINE BESYLATE 10 MG T: 10 | 30 days supply | Qty: 30 | Fill #1

## 2019-02-25 MED FILL — cloNIDine HCL 0.1 MG TABS: 0.1 | 30 days supply | Qty: 30 | Fill #5

## 2019-02-25 MED FILL — MONTELUKAST SOD 10 MG TAB: 10 | 30 days supply | Qty: 30 | Fill #1

## 2019-02-27 MED FILL — $VENTOLIN HFA 18G INHALER: 108 (90 BAS | 25 days supply | Qty: 18 | Fill #0

## 2019-03-24 MED FILL — $VENTOLIN HFA 18G INHALER: 108 (90 BAS | 25 days supply | Qty: 18 | Fill #1

## 2019-03-24 MED FILL — AMLODIPINE BESYLATE 10 MG T: 10 | 30 days supply | Qty: 30 | Fill #2

## 2019-03-24 MED FILL — cloNIDine HCL 0.1 MG TABS: 0.1 | 30 days supply | Qty: 30 | Fill #6

## 2019-04-18 MED FILL — ?AMLODIPINE BESYLATE 10 MG: 10 | 30 days supply | Qty: 30 | Fill #3

## 2019-04-18 MED FILL — $Symbicort 160-4.5mcg/act: 160-4.5 | 90 days supply | Qty: 3 | Fill #3

## 2019-04-21 ENCOUNTER — Ambulatory Visit: Payer: Self-pay | Attending: Family Medicine | Admitting: Family Medicine

## 2019-04-21 ENCOUNTER — Ambulatory Visit: Payer: Self-pay | Admitting: Family Medicine

## 2019-04-21 ENCOUNTER — Other Ambulatory Visit: Payer: Self-pay

## 2019-04-21 ENCOUNTER — Encounter: Payer: Self-pay | Admitting: Family Medicine

## 2019-04-21 VITALS — BP 138/82 | HR 65 | Temp 97.7°F | Ht 64.0 in | Wt 122.6 lb

## 2019-04-21 DIAGNOSIS — E559 Vitamin D deficiency, unspecified: Secondary | ICD-10-CM

## 2019-04-21 DIAGNOSIS — J449 Chronic obstructive pulmonary disease, unspecified: Secondary | ICD-10-CM

## 2019-04-21 DIAGNOSIS — J3089 Other allergic rhinitis: Secondary | ICD-10-CM

## 2019-04-21 DIAGNOSIS — I10 Essential (primary) hypertension: Secondary | ICD-10-CM

## 2019-04-21 DIAGNOSIS — Z780 Asymptomatic menopausal state: Secondary | ICD-10-CM

## 2019-04-21 MED ORDER — AMLODIPINE BESYLATE 10 MG PO TABS: 10.0000 mg | ORAL_TABLET | Freq: Every day | ORAL | 1 refills | Status: DC

## 2019-04-21 MED ORDER — ALBUTEROL SULFATE HFA 108 (90 BASE) MCG/ACT IN AERS: INHALATION_SPRAY | RESPIRATORY_TRACT | 2 refills | Status: DC

## 2019-04-21 MED ORDER — VENLAFAXINE HCL ER 75 MG PO CP24: 75.0000 mg | ORAL_CAPSULE | Freq: Every day | ORAL | 1 refills | Status: DC

## 2019-04-21 MED ORDER — MONTELUKAST SODIUM 10 MG PO TABS: 10.0000 mg | ORAL_TABLET | Freq: Every day | ORAL | 1 refills | Status: DC

## 2019-04-21 MED ORDER — ALBUTEROL SULFATE (2.5 MG/3ML) 0.083% IN NEBU: 2.5000 mg | INHALATION_SOLUTION | Freq: Four times a day (QID) | RESPIRATORY_TRACT | 6 refills | Status: DC | PRN

## 2019-04-21 MED ORDER — FLUTICASONE PROPIONATE 50 MCG/ACT NA SUSP: 2.0000 | Freq: Every day | NASAL | 1 refills | Status: DC

## 2019-04-21 MED ORDER — BUDESONIDE-FORMOTEROL FUMARATE 160-4.5 MCG/ACT IN AERO: 2.0000 | INHALATION_SPRAY | Freq: Two times a day (BID) | RESPIRATORY_TRACT | 6 refills | Status: DC

## 2019-04-21 MED ORDER — UMECLIDINIUM BROMIDE 62.5 MCG/INH IN AEPB: 1.0000 | INHALATION_SPRAY | Freq: Every day | RESPIRATORY_TRACT | 3 refills | Status: DC

## 2019-04-21 MED ORDER — CLONIDINE HCL 0.1 MG PO TABS: 0.1000 mg | ORAL_TABLET | Freq: Every day | ORAL | 1 refills | Status: DC

## 2019-04-21 MED FILL — VENLAFAXINE HCL ER 75 MG CA: 75 | 30 days supply | Qty: 30 | Fill #0

## 2019-04-21 MED FILL — $INCRUSE ELLIPTA 62.5 MCG I: 62.5 MCG | 90 days supply | Qty: 90 | Fill #0

## 2019-04-21 MED FILL — FLUTICASONE PROP 50 MCG SPR: 50 | 30 days supply | Qty: 16 | Fill #0

## 2019-04-21 MED FILL — MONTELUKAST SOD 10 MG TAB: 10 | 30 days supply | Qty: 30 | Fill #0

## 2019-04-21 MED FILL — ?CLONIDINE HCL 0.1 MG TABL: 0.1 | 90 days supply | Qty: 90 | Fill #0

## 2019-04-21 MED FILL — ALBUTEROL SUL 2.5 MG/3 ML S: (2.5 MG/3ML | 6 days supply | Qty: 75 | Fill #0

## 2019-04-21 NOTE — Progress Notes (Signed)
Subjective:  Patient ID: Tanya Jordan, female    DOB: [birth year]  Age: 53 y.o. MRN: [id number]  CC: Hypertension   HPI Tanya Jordan Tanya Jordan is 53 year old female with a history of COPD, alcohol abuse, tobacco abuse, seasonal allergies, hypertension who presents today for a follow-up.  COPD is controlled on her current inhalers and she denies exacerbation and her seasonal allergies are controlled.  She is doing well on her antihypertensive and denies adverse effects from her medications. She denies chest pain, dyspnea, pedal edema.  Currently on her way from work at [employer] and will be heading home after this appointment. Her menopause symptoms are controlled on clonidine and Effexor XR and she completed a course of Drisdol due to vitamin D deficiency and fatigue and feels better She has no acute concerns today  Past Medical History:  Diagnosis Date  . Allergy   . COPD (chronic obstructive pulmonary disease) ([place])   . Family history of breast cancer   . Hypertension   . Personal history of colonic polyps     Past Surgical History:  Procedure Laterality Date  . BREAST BIOPSY Left   . REVISION OF SCAR ON FACE/HEAD      Family History  Problem Relation Age of Onset  . Lung cancer Father   . Hypertension Mother   . Breast cancer Maternal Grandmother 96  . Dementia Maternal Grandmother   . Diabetes Maternal Grandfather   . Colon cancer Neg Hx   . Stomach cancer Neg Hx     Allergies  Allergen Reactions  . Lisinopril Swelling    Outpatient Medications Prior to Visit  Medication Sig Dispense Refill  . cetirizine (ZYRTEC) 10 MG tablet TAKE 1 TABLET (10 MG TOTAL) BY MOUTH DAILY. 30 tablet 1  . ergocalciferol (DRISDOL) 1.25 MG (50000 UT) capsule Take 1 capsule (50,000 Units total) by mouth once a week. 4 capsule 1  . albuterol (PROVENTIL) (2.5 MG/3ML) 0.083% nebulizer solution Take 3 mLs (2.5 mg total) by nebulization every 6 (six) hours as needed for wheezing  or shortness of breath. 75 mL 6  . amLODipine (NORVASC) 10 MG tablet Take 1 tablet (10 mg total) by mouth daily. 30 tablet 6  . budesonide-formoterol (SYMBICORT) 160-4.5 MCG/ACT inhaler Inhale 2 puffs into the lungs 2 (two) times daily. 3 Inhaler 6  . cloNIDine (CATAPRES) 0.1 MG tablet Take 1 tablet (0.1 mg total) by mouth at bedtime. For hot flashes 30 tablet 6  . fluticasone (FLONASE) 50 MCG/ACT nasal spray Place 2 sprays into both nostrils daily. 16 g 6  . montelukast (SINGULAIR) 10 MG tablet TAKE 1 TABLET (10 MG TOTAL) BY MOUTH AT BEDTIME. 30 tablet 2  . umeclidinium bromide (INCRUSE ELLIPTA) 62.5 MCG/INH AEPB Inhale 1 puff into the lungs daily. 90 each 3  . venlafaxine XR (EFFEXOR XR) 75 MG 24 hr capsule Take 1 capsule (75 mg total) by mouth daily with breakfast. 30 capsule 6  . VENTOLIN HFA 108 (90 Base) MCG/ACT inhaler INHALE 2 PUFFS INTO THE LUNGS EVERY 4 (FOUR) HOURS AS NEEDED FOR WHEEZING OR SHORTNESS OF BREATH. 18 g 2  . mupirocin cream (BACTROBAN) 2 % Apply 1 application topically 2 (two) times daily. Apply to right ear (Patient not taking: Reported on 10/16/2017) 30 g 1  . nicotine (NICODERM CQ) 21 mg/24hr patch Place 1 patch (21 mg total) onto the skin daily. (Patient not taking: Reported on 10/16/2017) 28 patch 0   Facility-Administered Medications Prior to Visit  Medication Dose Route Frequency Provider Last Rate Last Dose  . 0.9 %  sodium chloride infusion  500 mL Intravenous Continuous Milus Banister, MD         ROS Review of Systems  Constitutional: Negative for activity change, appetite change and fatigue.  HENT: Negative for congestion, sinus pressure and sore throat.   Eyes: Negative for visual disturbance.  Respiratory: Negative for cough, chest tightness, shortness of breath and wheezing.   Cardiovascular: Negative for chest pain and palpitations.  Gastrointestinal: Negative for abdominal distention, abdominal pain and constipation.  Endocrine: Negative for  polydipsia.  Genitourinary: Negative for dysuria and frequency.  Musculoskeletal: Negative for arthralgias and back pain.  Skin: Negative for rash.  Neurological: Negative for tremors, light-headedness and numbness.  Hematological: Does not bruise/bleed easily.  Psychiatric/Behavioral: Negative for agitation and behavioral problems.    Objective:  BP 138/82   Pulse 65   Temp 97.7 F (36.5 C) (Oral)   Ht 5' 4"  (1.626 m)   Wt 122 lb 9.6 oz (55.6 kg)   LMP 03/29/2011   SpO2 99%   BMI 21.04 kg/m   BP/Weight 04/21/2019 10/21/2018 17/51/0258  Systolic BP 527 782 423  Diastolic BP 82 73 76  Wt. (Lbs) 122.6 125.4 126  BMI 21.04 21.52 21.63      Physical Exam Constitutional:      Appearance: She is well-developed.  Cardiovascular:     Rate and Rhythm: Normal rate.     Heart sounds: Normal heart sounds. No murmur.  Pulmonary:     Effort: Pulmonary effort is normal.     Breath sounds: Normal breath sounds. No wheezing or rales.  Chest:     Chest wall: No tenderness.  Abdominal:     General: Bowel sounds are normal. There is no distension.     Palpations: Abdomen is soft. There is no mass.     Tenderness: There is no abdominal tenderness.  Musculoskeletal: Normal range of motion.  Neurological:     Mental Status: She is alert and oriented to person, place, and time.     CMP Latest Ref Rng & Units 08/01/2018 01/21/2018 06/17/2017  Glucose 65 - 99 mg/dL 78 80 70  BUN 6 - 24 mg/dL 6 12 6   Creatinine 0.57 - 1.00 mg/dL 0.79 0.77 0.86  Sodium 134 - 144 mmol/L 142 143 141  Potassium 3.5 - 5.2 mmol/L 4.2 3.8 3.6  Chloride 96 - 106 mmol/L 101 104 103  CO2 20 - 29 mmol/L 23 23 18(L)  Calcium 8.7 - 10.2 mg/dL 9.4 9.2 9.4  Total Protein 6.0 - 8.5 g/dL 7.1 6.8 -  Total Bilirubin 0.0 - 1.2 mg/dL <0.2 0.2 -  Alkaline Phos 39 - 117 IU/L 88 81 -  AST 0 - 40 IU/L 14 10 -  ALT 0 - 32 IU/L 12 11 -    Lipid Panel     Component Value Date/Time   CHOL 189 08/01/2018 0928   TRIG 60  08/01/2018 0928   HDL 64 08/01/2018 0928   CHOLHDL 3.0 08/01/2018 0928   CHOLHDL 2.8 11/16/2016 0904   VLDL 16 11/16/2016 0904   LDLCALC 113 (H) 08/01/2018 0928    CBC    Component Value Date/Time   WBC 4.4 01/15/2016 0610   RBC 4.09 01/15/2016 0610   HGB 12.7 01/15/2016 0610   HCT 39.1 01/15/2016 0610   PLT 273 01/15/2016 0610   MCV 95.6 01/15/2016 0610   MCH 31.1 01/15/2016 0610   MCHC 32.5  01/15/2016 0610   RDW 13.5 01/15/2016 0610   LYMPHSABS 2.2 01/19/2011 1920   MONOABS 0.4 01/19/2011 1920   EOSABS 0.9 (H) 01/19/2011 1920   BASOSABS 0.0 01/19/2011 1920    Lab Results  Component Value Date   HGBA1C 5.4 06/17/2017    Assessment & Plan:   1. COPD with chronic bronchitis (HCC) Stable - albuterol (PROVENTIL) (2.5 MG/3ML) 0.083% nebulizer solution; Take 3 mLs (2.5 mg total) by nebulization every 6 (six) hours as needed for wheezing or shortness of breath.  Dispense: 75 mL; Refill: 6 - budesonide-formoterol (SYMBICORT) 160-4.5 MCG/ACT inhaler; Inhale 2 puffs into the lungs 2 (two) times daily.  Dispense: 3 Inhaler; Refill: 6 - umeclidinium bromide (INCRUSE ELLIPTA) 62.5 MCG/INH AEPB; Inhale 1 puff into the lungs daily.  Dispense: 90 each; Refill: 3 - albuterol (VENTOLIN HFA) 108 (90 Base) MCG/ACT inhaler; INHALE 2 PUFFS INTO THE LUNGS EVERY 4 (FOUR) HOURS AS NEEDED FOR WHEEZING OR SHORTNESS OF BREATH.  Dispense: 18 g; Refill: 2  2. Essential hypertension Controlled - Lipid panel - CMP14+EGFR - VITAMIN D 25 Hydroxy (Vit-D Deficiency, Fractures) - amLODipine (NORVASC) 10 MG tablet; Take 1 tablet (10 mg total) by mouth daily.  Dispense: 90 tablet; Refill: 1  3. Menopause Controlled - cloNIDine (CATAPRES) 0.1 MG tablet; Take 1 tablet (0.1 mg total) by mouth at bedtime. For hot flashes  Dispense: 90 tablet; Refill: 1 - venlafaxine XR (EFFEXOR XR) 75 MG 24 hr capsule; Take 1 capsule (75 mg total) by mouth daily with breakfast.  Dispense: 90 capsule; Refill: 1  4. Seasonal  allergic rhinitis due to other allergic trigger Stable - fluticasone (FLONASE) 50 MCG/ACT nasal spray; Place 2 sprays into both nostrils daily.  Dispense: 16 g; Refill: 1 - montelukast (SINGULAIR) 10 MG tablet; Take 1 tablet (10 mg total) by mouth at bedtime.  Dispense: 90 tablet; Refill: 1  5. Vitamin D deficiency Completed Vitamin D replacement   Meds ordered this encounter  Medications  . albuterol (PROVENTIL) (2.5 MG/3ML) 0.083% nebulizer solution    Sig: Take 3 mLs (2.5 mg total) by nebulization every 6 (six) hours as needed for wheezing or shortness of breath.    Dispense:  75 mL    Refill:  6  . amLODipine (NORVASC) 10 MG tablet    Sig: Take 1 tablet (10 mg total) by mouth daily.    Dispense:  90 tablet    Refill:  1  . budesonide-formoterol (SYMBICORT) 160-4.5 MCG/ACT inhaler    Sig: Inhale 2 puffs into the lungs 2 (two) times daily.    Dispense:  3 Inhaler    Refill:  6  . cloNIDine (CATAPRES) 0.1 MG tablet    Sig: Take 1 tablet (0.1 mg total) by mouth at bedtime. For hot flashes    Dispense:  90 tablet    Refill:  1  . fluticasone (FLONASE) 50 MCG/ACT nasal spray    Sig: Place 2 sprays into both nostrils daily.    Dispense:  16 g    Refill:  1  . montelukast (SINGULAIR) 10 MG tablet    Sig: Take 1 tablet (10 mg total) by mouth at bedtime.    Dispense:  90 tablet    Refill:  1  . umeclidinium bromide (INCRUSE ELLIPTA) 62.5 MCG/INH AEPB    Sig: Inhale 1 puff into the lungs daily.    Dispense:  90 each    Refill:  3  . venlafaxine XR (EFFEXOR XR) 75 MG 24 hr capsule  Sig: Take 1 capsule (75 mg total) by mouth daily with breakfast.    Dispense:  90 capsule    Refill:  1  . albuterol (VENTOLIN HFA) 108 (90 Base) MCG/ACT inhaler    Sig: INHALE 2 PUFFS INTO THE LUNGS EVERY 4 (FOUR) HOURS AS NEEDED FOR WHEEZING OR SHORTNESS OF BREATH.    Dispense:  18 g    Refill:  2    Follow-up: Return in about 3 months (around 07/22/2019).       Charlott Rakes, MD, FAAFP.  [facility] and [place], [place]   04/21/2019, 2:37 PM

## 2019-04-22 ENCOUNTER — Other Ambulatory Visit: Payer: Self-pay | Admitting: Family Medicine

## 2019-04-22 DIAGNOSIS — E559 Vitamin D deficiency, unspecified: Secondary | ICD-10-CM

## 2019-04-22 LAB — CMP14+EGFR
ALT: 14 IU/L (ref 0–32)
AST: 15 IU/L (ref 0–40)
Albumin/Globulin Ratio: 2 (ref 1.2–2.2)
Albumin: 4.7 g/dL (ref 3.8–4.9)
Alkaline Phosphatase: 81 IU/L (ref 39–117)
BUN/Creatinine Ratio: 13 (ref 9–23)
BUN: 12 mg/dL (ref 6–24)
Bilirubin Total: <0.2 mg/dL (ref 0.0–1.2)
CO2: 22 mmol/L (ref 20–29)
Calcium: 9.4 mg/dL (ref 8.7–10.2)
Chloride: 104 mmol/L (ref 96–106)
Creatinine, Ser: 0.9 mg/dL (ref 0.57–1.00)
GFR calc Af Amer: 84 mL/min/{1.73_m2} (ref >59)
GFR calc non Af Amer: 73 mL/min/{1.73_m2} (ref >59)
Globulin, Total: 2.3 g/dL (ref 1.5–4.5)
Glucose: 81 mg/dL (ref 65–99)
Potassium: 4.2 mmol/L (ref 3.5–5.2)
Sodium: 140 mmol/L (ref 134–144)
Total Protein: 7 g/dL (ref 6.0–8.5)

## 2019-04-22 LAB — LIPID PANEL
Chol/HDL Ratio: 3.1 ratio (ref 0.0–4.4)
Cholesterol, Total: 190 mg/dL (ref 100–199)
HDL: 61 mg/dL (ref >39)
LDL Calculated: 103 mg/dL — ABNORMAL HIGH (ref 0–99)
Triglycerides: 128 mg/dL (ref 0–149)
VLDL Cholesterol Cal: 26 mg/dL (ref 5–40)

## 2019-04-22 LAB — VITAMIN D 25 HYDROXY (VIT D DEFICIENCY, FRACTURES): Vit D, 25-Hydroxy: 15.9 ng/mL — ABNORMAL LOW (ref 30.0–100.0)

## 2019-04-22 MED ORDER — ERGOCALCIFEROL 1.25 MG (50000 UT) PO CAPS: 50000.0000 [IU] | ORAL_CAPSULE | ORAL | 1 refills | Status: DC

## 2019-04-22 MED FILL — VIT D2 1.25 MG (50,000 UNIT: 1.25 MG | 56 days supply | Qty: 8 | Fill #0

## 2019-04-23 ENCOUNTER — Telehealth: Payer: Self-pay

## 2019-04-23 NOTE — Telephone Encounter (Signed)
-----   Message from Charlott Rakes, MD sent at 04/22/2019  1:32 PM EDT ----- Cholesterol , kidney and liver functions are normal. Vitamin D level is still low,i have refilled her Vit D capsules

## 2019-04-23 NOTE — Telephone Encounter (Signed)
Patient name and DOB has been verified Patient was informed of lab results. Patient had no questions.  

## 2019-05-13 MED FILL — $VENTOLIN HFA 18G INHALER: 108 (90 BAS | 25 days supply | Qty: 18 | Fill #2

## 2019-06-08 MED FILL — $VENTOLIN HFA 18G INHALER: 108 (90 BAS | 25 days supply | Qty: 18 | Fill #0

## 2019-06-08 MED FILL — ?AMLODIPINE BESYLATE 10 MG: 10 | 30 days supply | Qty: 30 | Fill #4

## 2019-06-22 MED FILL — MONTELUKAST SOD 10 MG TAB: 10 | 30 days supply | Qty: 30 | Fill #1

## 2019-07-06 MED FILL — ?AMLODIPINE BESYLATE 10 MG: 10 | 30 days supply | Qty: 30 | Fill #5

## 2019-07-06 MED FILL — $VENTOLIN HFA 18G INHALER: 108 (90 BAS | 25 days supply | Qty: 18 | Fill #1

## 2019-07-22 ENCOUNTER — Ambulatory Visit: Payer: Self-pay | Admitting: Family Medicine

## 2019-07-23 MED FILL — MONTELUKAST SOD 10 MG TAB: 10 | 30 days supply | Qty: 30 | Fill #2

## 2019-07-28 ENCOUNTER — Ambulatory Visit: Payer: Self-pay | Attending: Family Medicine | Admitting: Family Medicine

## 2019-07-28 ENCOUNTER — Encounter: Payer: Self-pay | Admitting: Family Medicine

## 2019-07-28 ENCOUNTER — Other Ambulatory Visit: Payer: Self-pay

## 2019-07-28 DIAGNOSIS — Z780 Asymptomatic menopausal state: Secondary | ICD-10-CM

## 2019-07-28 DIAGNOSIS — J449 Chronic obstructive pulmonary disease, unspecified: Secondary | ICD-10-CM

## 2019-07-28 DIAGNOSIS — J3089 Other allergic rhinitis: Secondary | ICD-10-CM

## 2019-07-28 DIAGNOSIS — I10 Essential (primary) hypertension: Secondary | ICD-10-CM

## 2019-07-28 DIAGNOSIS — J4489 Other specified chronic obstructive pulmonary disease: Secondary | ICD-10-CM

## 2019-07-28 MED ORDER — BUDESONIDE-FORMOTEROL FUMARATE 160-4.5 MCG/ACT IN AERO: 2.0000 | INHALATION_SPRAY | Freq: Two times a day (BID) | RESPIRATORY_TRACT | 6 refills | Status: DC

## 2019-07-28 MED ORDER — VENLAFAXINE HCL ER 75 MG PO CP24: 75.0000 mg | ORAL_CAPSULE | Freq: Every day | ORAL | 1 refills | Status: DC

## 2019-07-28 MED ORDER — MONTELUKAST SODIUM 10 MG PO TABS: 10.0000 mg | ORAL_TABLET | Freq: Every day | ORAL | 1 refills | Status: DC

## 2019-07-28 MED ORDER — ALBUTEROL SULFATE HFA 108 (90 BASE) MCG/ACT IN AERS: INHALATION_SPRAY | RESPIRATORY_TRACT | 2 refills | Status: DC

## 2019-07-28 MED ORDER — AMLODIPINE BESYLATE 10 MG PO TABS: 10.0000 mg | ORAL_TABLET | Freq: Every day | ORAL | 1 refills | Status: DC

## 2019-07-28 MED ORDER — CLONIDINE HCL 0.1 MG PO TABS: 0.1000 mg | ORAL_TABLET | Freq: Every day | ORAL | 1 refills | Status: DC

## 2019-07-28 MED FILL — cloNIDine HCL 0.1 MG TABS: 0.1 | 90 days supply | Qty: 90 | Fill #0

## 2019-07-28 MED FILL — $Symbicort 160-4.5mcg/act: 160-4.5 | 90 days supply | Qty: 3 | Fill #0

## 2019-07-28 MED FILL — $VENTOLIN HFA 18G INHALER: 108 (90 BAS | 16 days supply | Qty: 18 | Fill #0

## 2019-07-28 MED FILL — VENLAFAXINE HCL ER 75 MG CA: 75 | 90 days supply | Qty: 90 | Fill #0

## 2019-07-28 NOTE — Progress Notes (Signed)
Patient has been called and DOB has been verified. Patient has been screened and transferred to PCP to start phone visit.     

## 2019-07-28 NOTE — Progress Notes (Signed)
Virtual Visit via Telephone Note  I connected with Tanya Jordan, on 07/28/2019 at 4:28 PM by telephone due to the COVID-19 pandemic and verified that I am speaking with the correct person using two identifiers.   Consent: I discussed the limitations, risks, security and privacy concerns of performing an evaluation and management service by telephone and the availability of in person appointments. I also discussed with the patient that there may be a patient responsible charge related to this service. The patient expressed understanding and agreed to proceed.   Location of Patient: Home  Location of Provider: Clinic   Persons participating in Telemedicine visit: Darrel Hoover Farrington-CMA Dr. Felecia Shelling     History of Present Illness: Tanya Jordan is 53 year old female with a history of COPD, alcohol abuse, tobacco abuse, seasonal allergies, hypertension who presents today for a follow-up. She has been more dyspneic due to the humidity and the fact that she has no air conditioning at home and also at work.  Endorses compliance with her inhalers. She continues to smoke about 2 to 3 cigarettes/day and is not ready to quit at this time. Doing well on her antihypertensive and denies chest pains or pedal edema. Her hot flashes are controlled on Effexor and Clonidine.  Past Medical History:  Diagnosis Date  . Allergy   . COPD (chronic obstructive pulmonary disease) ([place])   . Family history of breast cancer   . Hypertension   . Personal history of colonic polyps    Allergies  Allergen Reactions  . Lisinopril Swelling    Current Outpatient Medications on File Prior to Visit  Medication Sig Dispense Refill  . albuterol (PROVENTIL) (2.5 MG/3ML) 0.083% nebulizer solution Take 3 mLs (2.5 mg total) by nebulization every 6 (six) hours as needed for wheezing or shortness of breath. 75 mL 6  . albuterol (VENTOLIN HFA) 108 (90 Base) MCG/ACT inhaler INHALE 2 PUFFS INTO THE  LUNGS EVERY 4 (FOUR) HOURS AS NEEDED FOR WHEEZING OR SHORTNESS OF BREATH. 18 g 2  . amLODipine (NORVASC) 10 MG tablet Take 1 tablet (10 mg total) by mouth daily. 90 tablet 1  . budesonide-formoterol (SYMBICORT) 160-4.5 MCG/ACT inhaler Inhale 2 puffs into the lungs 2 (two) times daily. 3 Inhaler 6  . cetirizine (ZYRTEC) 10 MG tablet TAKE 1 TABLET (10 MG TOTAL) BY MOUTH DAILY. 30 tablet 1  . cloNIDine (CATAPRES) 0.1 MG tablet Take 1 tablet (0.1 mg total) by mouth at bedtime. For hot flashes 90 tablet 1  . ergocalciferol (DRISDOL) 1.25 MG (50000 UT) capsule Take 1 capsule (50,000 Units total) by mouth once a week. 4 capsule 1  . fluticasone (FLONASE) 50 MCG/ACT nasal spray Place 2 sprays into both nostrils daily. 16 g 1  . montelukast (SINGULAIR) 10 MG tablet Take 1 tablet (10 mg total) by mouth at bedtime. 90 tablet 1  . umeclidinium bromide (INCRUSE ELLIPTA) 62.5 MCG/INH AEPB Inhale 1 puff into the lungs daily. 90 each 3  . venlafaxine XR (EFFEXOR XR) 75 MG 24 hr capsule Take 1 capsule (75 mg total) by mouth daily with breakfast. 90 capsule 1  . mupirocin cream (BACTROBAN) 2 % Apply 1 application topically 2 (two) times daily. Apply to right ear (Patient not taking: Reported on 10/16/2017) 30 g 1  . nicotine (NICODERM CQ) 21 mg/24hr patch Place 1 patch (21 mg total) onto the skin daily. (Patient not taking: Reported on 10/16/2017) 28 patch 0   Current Facility-Administered Medications on File Prior to Visit  Medication  Dose Route Frequency Provider Last Rate Last Dose  . 0.9 %  sodium chloride infusion  500 mL Intravenous Continuous Milus Banister, MD        Observations/Objective: Awake, alert, oriented x3 Not in acute distress  Assessment and Plan: 1. Essential hypertension Controlled - amLODipine (NORVASC) 10 MG tablet; Take 1 tablet (10 mg total) by mouth daily.  Dispense: 90 tablet; Refill: 1  2. Menopause Stable - venlafaxine XR (EFFEXOR XR) 75 MG 24 hr capsule; Take 1 capsule (75  mg total) by mouth daily with breakfast.  Dispense: 90 capsule; Refill: 1 - cloNIDine (CATAPRES) 0.1 MG tablet; Take 1 tablet (0.1 mg total) by mouth at bedtime. For hot flashes  Dispense: 90 tablet; Refill: 1  3. Seasonal allergic rhinitis due to other allergic trigger Controlled - montelukast (SINGULAIR) 10 MG tablet; Take 1 tablet (10 mg total) by mouth at bedtime. Please dispense #90  Dispense: 90 tablet; Refill: 1  4. COPD with chronic bronchitis ([place]) Symptoms are worsened with the heat Advised that smoking cessation will be beneficial She is not ready to quit at this time - albuterol (VENTOLIN HFA) 108 (90 Base) MCG/ACT inhaler; INHALE 2 PUFFS INTO THE LUNGS EVERY 4 (FOUR) HOURS AS NEEDED FOR WHEEZING OR SHORTNESS OF BREATH.  Dispense: 18 g; Refill: 2 - budesonide-formoterol (SYMBICORT) 160-4.5 MCG/ACT inhaler; Inhale 2 puffs into the lungs 2 (two) times daily.  Dispense: 3 Inhaler; Refill: 6   Follow Up Instructions: Return in about 6 months (around 01/28/2020) for medical conditions.    I discussed the assessment and treatment plan with the patient. The patient was provided an opportunity to ask questions and all were answered. The patient agreed with the plan and demonstrated an understanding of the instructions.   The patient was advised to call back or seek an in-person evaluation if the symptoms worsen or if the condition fails to improve as anticipated.     I provided 15 minutes total of non-face-to-face time during this encounter including median intraservice time, reviewing previous notes, labs, imaging, medications, management and patient verbalized understanding.     Charlott Rakes, MD, FAAFP. [facility] and [place], [place]   07/28/2019, 4:28 PM

## 2019-07-30 ENCOUNTER — Ambulatory Visit (HOSPITAL_BASED_OUTPATIENT_CLINIC_OR_DEPARTMENT_OTHER): Payer: Self-pay | Admitting: Pharmacist

## 2019-07-30 ENCOUNTER — Other Ambulatory Visit: Payer: Self-pay

## 2019-07-30 DIAGNOSIS — Z23 Encounter for immunization: Secondary | ICD-10-CM

## 2019-07-30 NOTE — Progress Notes (Signed)
Patient presents for vaccination against influenza per orders of Dr. Newlin. Consent given. Counseling provided. No contraindications exists. Vaccine administered without incident.   

## 2019-08-11 MED FILL — ?AMLODIPINE BESYLATE 10 MG: 10 | 30 days supply | Qty: 30 | Fill #6

## 2019-08-24 MED FILL — $VENTOLIN HFA 18G INHALER: 108 (90 BAS | 25 days supply | Qty: 18 | Fill #1

## 2019-08-24 MED FILL — MONTELUKAST SOD 10 MG TAB: 10 | 30 days supply | Qty: 30 | Fill #3

## 2019-09-09 MED FILL — ?AMLODIPINE BESYLATE 10 MG: 10 | 90 days supply | Qty: 90 | Fill #0

## 2019-09-21 MED FILL — $VENTOLIN HFA 18G INHALER: 108 (90 BAS | 16 days supply | Qty: 18 | Fill #2

## 2019-09-21 MED FILL — MONTELUKAST SOD 10 MG TAB: 10 | 30 days supply | Qty: 30 | Fill #4

## 2019-10-19 MED FILL — !VENTOLIN HFA INHALER: 108 (90 BAS | 25 days supply | Qty: 18 | Fill #2

## 2019-10-19 MED FILL — !SYMBICORT 160-4.5 MCG INH: 160-4.5 | 30 days supply | Qty: 1 | Fill #1

## 2019-10-19 MED FILL — MONTELUKAST SOD 10 MG TAB: 10 | 30 days supply | Qty: 30 | Fill #5

## 2019-11-16 ENCOUNTER — Other Ambulatory Visit: Payer: Self-pay | Admitting: Family Medicine

## 2019-11-16 DIAGNOSIS — J449 Chronic obstructive pulmonary disease, unspecified: Secondary | ICD-10-CM

## 2019-11-16 MED FILL — MONTELUKAST SOD 10 MG TAB: 10 | 30 days supply | Qty: 30 | Fill #0

## 2019-11-17 MED FILL — ALBUTEROL SULFATE HFA 108 (: 108 (90 BAS | 16 days supply | Qty: 18 | Fill #0

## 2019-12-08 MED FILL — ?AMLODIPINE BESYLATE 10 MG: 10 | 90 days supply | Qty: 90 | Fill #1

## 2019-12-08 MED FILL — cloNIDine HCL 0.1 MG TABS: 0.1 | 90 days supply | Qty: 90 | Fill #1

## 2019-12-10 ENCOUNTER — Other Ambulatory Visit: Payer: Self-pay

## 2019-12-10 DIAGNOSIS — J449 Chronic obstructive pulmonary disease, unspecified: Secondary | ICD-10-CM

## 2019-12-10 MED ORDER — ALBUTEROL SULFATE HFA 108 (90 BASE) MCG/ACT IN AERS: INHALATION_SPRAY | RESPIRATORY_TRACT | 3 refills | Status: DC

## 2019-12-10 MED FILL — $VENTOLIN HFA 18G INHALER: 108 (90 BAS | 48 days supply | Qty: 54 | Fill #0

## 2019-12-25 MED FILL — MONTELUKAST SOD 10 MG TAB: 10 | 30 days supply | Qty: 30 | Fill #1

## 2020-01-12 ENCOUNTER — Other Ambulatory Visit: Payer: Self-pay

## 2020-01-12 ENCOUNTER — Ambulatory Visit: Payer: Self-pay | Attending: Family Medicine | Admitting: Family Medicine

## 2020-01-12 ENCOUNTER — Encounter: Payer: Self-pay | Admitting: Family Medicine

## 2020-01-12 DIAGNOSIS — F17210 Nicotine dependence, cigarettes, uncomplicated: Secondary | ICD-10-CM

## 2020-01-12 DIAGNOSIS — J3089 Other allergic rhinitis: Secondary | ICD-10-CM

## 2020-01-12 DIAGNOSIS — E559 Vitamin D deficiency, unspecified: Secondary | ICD-10-CM

## 2020-01-12 DIAGNOSIS — Z780 Asymptomatic menopausal state: Secondary | ICD-10-CM

## 2020-01-12 DIAGNOSIS — J449 Chronic obstructive pulmonary disease, unspecified: Secondary | ICD-10-CM

## 2020-01-12 DIAGNOSIS — Z720 Tobacco use: Secondary | ICD-10-CM

## 2020-01-12 DIAGNOSIS — I10 Essential (primary) hypertension: Secondary | ICD-10-CM

## 2020-01-12 MED ORDER — AMLODIPINE BESYLATE 10 MG PO TABS: 10.0000 mg | ORAL_TABLET | Freq: Every day | ORAL | 1 refills | Status: DC

## 2020-01-12 MED ORDER — VENLAFAXINE HCL ER 75 MG PO CP24: 75.0000 mg | ORAL_CAPSULE | Freq: Every day | ORAL | 1 refills | Status: DC

## 2020-01-12 MED ORDER — BUDESONIDE-FORMOTEROL FUMARATE 160-4.5 MCG/ACT IN AERO: 2.0000 | INHALATION_SPRAY | Freq: Two times a day (BID) | RESPIRATORY_TRACT | 6 refills | Status: DC

## 2020-01-12 MED ORDER — NICOTINE 21 MG/24HR TD PT24: 21.0000 mg | MEDICATED_PATCH | Freq: Every day | TRANSDERMAL | 1 refills | Status: DC

## 2020-01-12 MED ORDER — INCRUSE ELLIPTA 62.5 MCG/INH IN AEPB: 1.0000 | INHALATION_SPRAY | Freq: Every day | RESPIRATORY_TRACT | 3 refills | Status: DC

## 2020-01-12 MED ORDER — MONTELUKAST SODIUM 10 MG PO TABS: 10.0000 mg | ORAL_TABLET | Freq: Every day | ORAL | 1 refills | Status: DC

## 2020-01-12 MED ORDER — CLONIDINE HCL 0.2 MG PO TABS: 0.2000 mg | ORAL_TABLET | Freq: Every day | ORAL | 6 refills | Status: DC

## 2020-01-12 MED ORDER — CETIRIZINE HCL 10 MG PO TABS: 10.0000 mg | ORAL_TABLET | Freq: Every day | ORAL | 1 refills | Status: DC

## 2020-01-12 MED FILL — ?CETIRIZINE HCL 10 MG TABLE: 10 | 30 days supply | Qty: 30 | Fill #0

## 2020-01-12 MED FILL — ?CLONIDINE HCL 0.2 MG TABS: 0.2 | 30 days supply | Qty: 30 | Fill #0

## 2020-01-12 MED FILL — AMLODIPINE BESYLATE 10 MG T: 10 | 90 days supply | Qty: 90 | Fill #0

## 2020-01-12 MED FILL — NICOTINE 21 MG/24HR PATCH: 21 | 28 days supply | Qty: 28 | Fill #0

## 2020-01-12 MED FILL — $Symbicort 160-4.5mcg/act: 160-4.5 | 90 days supply | Qty: 3 | Fill #0

## 2020-01-12 MED FILL — VENLAFAXINE HCL ER 75 MG CA: 75 | 90 days supply | Qty: 90 | Fill #0

## 2020-01-12 MED FILL — $INCRUSE ELLIPTA 62.5 MCG I: 62.5 MCG | 90 days supply | Qty: 90 | Fill #0

## 2020-01-12 NOTE — Progress Notes (Signed)
Patient has been called and DOB has been verified. Patient has been screened and transferred to PCP to start phone visit.     

## 2020-01-12 NOTE — Progress Notes (Signed)
Virtual Visit via Telephone Note  I connected with Tanya Jordan, on 01/12/2020 at 1:37 PM by telephone due to the COVID-19 pandemic and verified that I am speaking with the correct person using two identifiers.   Consent: I discussed the limitations, risks, security and privacy concerns of performing an evaluation and management service by telephone and the availability of in person appointments. I also discussed with the patient that there may be a patient responsible charge related to this service. The patient expressed understanding and agreed to proceed.   Location of Patient: Home  Location of Provider: Clinic   Persons participating in Telemedicine visit: Darrel Hoover Farrington-CMA Dr. Margarita Rana     History of Present Illness: Tanya Jordan is 54 year old female with a history of COPD, alcohol abuse, tobacco abuse, seasonal allergies, hypertension who presents today for a follow-up. Her COPD has been stable; she has some sinus issues which sometimes exacerbates her COPD.  She has increased her smoking due to stress at work - now smokes half a pack/day She lost her inhaler - the Ventolin which she received via mail order from the Drug company but does have Incruse and Symbicort. Her BP at home was good when she last checked in 11/2019 and she denies adverse effects from her medications Her hot flashes are not controlled on current Clonidine dose. Past Medical History:  Diagnosis Date  . Allergy   . COPD (chronic obstructive pulmonary disease) ([place])   . Family history of breast cancer   . Hypertension   . Personal history of colonic polyps    Allergies  Allergen Reactions  . Lisinopril Swelling    Current Outpatient Medications on File Prior to Visit  Medication Sig Dispense Refill  . albuterol (VENTOLIN HFA) 108 (90 Base) MCG/ACT inhaler INHALE 2 PUFFS INTO THE LUNGS EVERY 4 (FOUR) HOURS AS NEEDED FOR WHEEZING OR SHORTNESS OF BREATH. 54 g 3  . amLODipine  (NORVASC) 10 MG tablet Take 1 tablet (10 mg total) by mouth daily. 90 tablet 1  . budesonide-formoterol (SYMBICORT) 160-4.5 MCG/ACT inhaler Inhale 2 puffs into the lungs 2 (two) times daily. 3 Inhaler 6  . cetirizine (ZYRTEC) 10 MG tablet TAKE 1 TABLET (10 MG TOTAL) BY MOUTH DAILY. 30 tablet 1  . cloNIDine (CATAPRES) 0.1 MG tablet Take 1 tablet (0.1 mg total) by mouth at bedtime. For hot flashes 90 tablet 1  . fluticasone (FLONASE) 50 MCG/ACT nasal spray Place 2 sprays into both nostrils daily. 16 g 1  . montelukast (SINGULAIR) 10 MG tablet Take 1 tablet (10 mg total) by mouth at bedtime. Please dispense #90 90 tablet 1  . umeclidinium bromide (INCRUSE ELLIPTA) 62.5 MCG/INH AEPB Inhale 1 puff into the lungs daily. 90 each 3  . venlafaxine XR (EFFEXOR XR) 75 MG 24 hr capsule Take 1 capsule (75 mg total) by mouth daily with breakfast. 90 capsule 1  . albuterol (PROVENTIL) (2.5 MG/3ML) 0.083% nebulizer solution Take 3 mLs (2.5 mg total) by nebulization every 6 (six) hours as needed for wheezing or shortness of breath. (Patient not taking: Reported on 01/12/2020) 75 mL 6  . ergocalciferol (DRISDOL) 1.25 MG (50000 UT) capsule Take 1 capsule (50,000 Units total) by mouth once a week. (Patient not taking: Reported on 01/12/2020) 4 capsule 1  . mupirocin cream (BACTROBAN) 2 % Apply 1 application topically 2 (two) times daily. Apply to right ear (Patient not taking: Reported on 10/16/2017) 30 g 1  . nicotine (NICODERM CQ) 21 mg/24hr patch Place  1 patch (21 mg total) onto the skin daily. (Patient not taking: Reported on 10/16/2017) 28 patch 0   Current Facility-Administered Medications on File Prior to Visit  Medication Dose Route Frequency Provider Last Rate Last Admin  . 0.9 %  sodium chloride infusion  500 mL Intravenous Continuous Milus Banister, MD        Observations/Objective: Alert, awake , oriented x3 Not in acute distress   CMP Latest Ref Rng & Units 04/21/2019 08/01/2018 01/21/2018  Glucose 65 -  99 mg/dL 81 78 80  BUN 6 - 24 mg/dL 12 6 12   Creatinine 0.57 - 1.00 mg/dL 0.90 0.79 0.77  Sodium 134 - 144 mmol/L 140 142 143  Potassium 3.5 - 5.2 mmol/L 4.2 4.2 3.8  Chloride 96 - 106 mmol/L 104 101 104  CO2 20 - 29 mmol/L 22 23 23   Calcium 8.7 - 10.2 mg/dL 9.4 9.4 9.2  Total Protein 6.0 - 8.5 g/dL 7.0 7.1 6.8  Total Bilirubin 0.0 - 1.2 mg/dL <0.2 <0.2 0.2  Alkaline Phos 39 - 117 IU/L 81 88 81  AST 0 - 40 IU/L 15 14 10   ALT 0 - 32 IU/L 14 12 11     Lipid Panel     Component Value Date/Time   CHOL 190 04/21/2019 1420   TRIG 128 04/21/2019 1420   HDL 61 04/21/2019 1420   CHOLHDL 3.1 04/21/2019 1420   CHOLHDL 2.8 11/16/2016 0904   VLDL 16 11/16/2016 0904   LDLCALC 103 (H) 04/21/2019 1420   LABVLDL 26 04/21/2019 1420    Assessment and Plan: 1. COPD with chronic bronchitis (HCC) Stable Intermittent flares trigerred by sinusitis She has lost her bronchodilator and has been advised to see the Pharmacy for early refill or sample Advised smoking cessation will be beneficial with regards to quitting - umeclidinium bromide (INCRUSE ELLIPTA) 62.5 MCG/INH AEPB; Inhale 1 puff into the lungs daily.  Dispense: 90 each; Refill: 3 - budesonide-formoterol (SYMBICORT) 160-4.5 MCG/ACT inhaler; Inhale 2 puffs into the lungs 2 (two) times daily.  Dispense: 3 Inhaler; Refill: 6  2. Seasonal allergic rhinitis due to other allergic trigger Stable - montelukast (SINGULAIR) 10 MG tablet; Take 1 tablet (10 mg total) by mouth at bedtime. Please dispense #90  Dispense: 90 tablet; Refill: 1 - cetirizine (ZYRTEC) 10 MG tablet; Take 1 tablet (10 mg total) by mouth daily.  Dispense: 30 tablet; Refill: 1  3. Essential hypertension Controlled Counseled on blood pressure goal of less than 130/80, low-sodium, DASH diet, medication compliance, 150 minutes of moderate intensity exercise per week. Discussed medication compliance, adverse effects. - amLODipine (NORVASC) 10 MG tablet; Take 1 tablet (10 mg total)  by mouth daily.  Dispense: 90 tablet; Refill: 1 - CMP14+EGFR - Lipid panel  4. Menopause Hot flashes are uncontrolled Increase Clonidine dose Advised to check BP at home to prevent hypotension given increased Clonidine dose - cloNIDine (CATAPRES) 0.2 MG tablet; Take 1 tablet (0.2 mg total) by mouth at bedtime. For hot flashes  Dispense: 30 tablet; Refill: 6 - venlafaxine XR (EFFEXOR XR) 75 MG 24 hr capsule; Take 1 capsule (75 mg total) by mouth daily with breakfast.  Dispense: 90 capsule; Refill: 1  5. Vitamin D deficiency H/o vit D deficiency Completed course of Drisdol Will repeat level - VITAMIN D 25 Hydroxy (Vit-D Deficiency, Fractures)  6. Tobacco abuse Spent 3 minutes counseling on smoking cessation and she is willing to work on quitting - nicotine (NICODERM CQ) 21 mg/24hr patch; Place 1 patch (21 mg  total) onto the skin daily.  Dispense: 28 patch; Refill: 1   Follow Up Instructions: Return in about 1 week (around 01/19/2020) for labs; 6 months PCP.    I discussed the assessment and treatment plan with the patient. The patient was provided an opportunity to ask questions and all were answered. The patient agreed with the plan and demonstrated an understanding of the instructions.   The patient was advised to call back or seek an in-person evaluation if the symptoms worsen or if the condition fails to improve as anticipated.     I provided 18 minutes total of non-face-to-face time during this encounter including median intraservice time, reviewing previous notes, investigations, ordering medications, medical decision making, coordinating care and patient verbalized understanding at the end of the visit.     Charlott Rakes, MD, FAAFP. [facility] and [place], [place]   01/12/2020, 1:37 PM

## 2020-01-19 ENCOUNTER — Other Ambulatory Visit: Payer: Self-pay

## 2020-01-19 ENCOUNTER — Ambulatory Visit: Payer: Self-pay | Attending: Family Medicine

## 2020-01-20 ENCOUNTER — Other Ambulatory Visit: Payer: Self-pay | Admitting: Family Medicine

## 2020-01-20 DIAGNOSIS — E559 Vitamin D deficiency, unspecified: Secondary | ICD-10-CM

## 2020-01-20 LAB — CMP14+EGFR
ALT: 12 IU/L (ref 0–32)
AST: 17 IU/L (ref 0–40)
Albumin/Globulin Ratio: 1.9 (ref 1.2–2.2)
Albumin: 4.7 g/dL (ref 3.8–4.9)
Alkaline Phosphatase: 96 IU/L (ref 39–117)
BUN/Creatinine Ratio: 9 (ref 9–23)
BUN: 8 mg/dL (ref 6–24)
Bilirubin Total: <0.2 mg/dL (ref 0.0–1.2)
CO2: 24 mmol/L (ref 20–29)
Calcium: 9.4 mg/dL (ref 8.7–10.2)
Chloride: 102 mmol/L (ref 96–106)
Creatinine, Ser: 0.87 mg/dL (ref 0.57–1.00)
GFR calc Af Amer: 87 mL/min/{1.73_m2} (ref >59)
GFR calc non Af Amer: 76 mL/min/{1.73_m2} (ref >59)
Globulin, Total: 2.5 g/dL (ref 1.5–4.5)
Glucose: 97 mg/dL (ref 65–99)
Potassium: 4.1 mmol/L (ref 3.5–5.2)
Sodium: 139 mmol/L (ref 134–144)
Total Protein: 7.2 g/dL (ref 6.0–8.5)

## 2020-01-20 LAB — VITAMIN D 25 HYDROXY (VIT D DEFICIENCY, FRACTURES): Vit D, 25-Hydroxy: 14.2 ng/mL — ABNORMAL LOW (ref 30.0–100.0)

## 2020-01-20 LAB — LIPID PANEL
Chol/HDL Ratio: 2.8 ratio (ref 0.0–4.4)
Cholesterol, Total: 218 mg/dL — ABNORMAL HIGH (ref 100–199)
HDL: 78 mg/dL (ref >39)
LDL Chol Calc (NIH): 131 mg/dL — ABNORMAL HIGH (ref 0–99)
Triglycerides: 53 mg/dL (ref 0–149)
VLDL Cholesterol Cal: 9 mg/dL (ref 5–40)

## 2020-01-20 MED ORDER — ERGOCALCIFEROL 1.25 MG (50000 UT) PO CAPS: 50000.0000 [IU] | ORAL_CAPSULE | ORAL | 0 refills | Status: DC

## 2020-01-21 ENCOUNTER — Telehealth: Payer: Self-pay

## 2020-01-21 MED FILL — ?ERGOCALCIFEROL 50000 UNITC: 1.25 MG | 28 days supply | Qty: 4 | Fill #0

## 2020-01-21 NOTE — Telephone Encounter (Signed)
-----   Message from Charlott Rakes, MD sent at 01/20/2020  8:15 PM EST ----- Her cholesterol is elevated compared to last set of labs. Please advise to adhere to a low cholesterol diet and exercise. No medications indicated at this time.Vitamin D is low and I have a rx for replacement to her Pharmacy for 12 weeks after which she should remain on OTC 1000 units. Other labs are normal.

## 2020-01-21 NOTE — Telephone Encounter (Signed)
Patient was called and a voicemail was left informing patient to return phone call for lab results. 

## 2020-02-01 MED FILL — ?ERGOCALCIFEROL 50000 UNITC: 1.25 MG | 28 days supply | Qty: 4 | Fill #0

## 2020-02-01 MED FILL — MONTELUKAST SOD 10 MG TAB: 10 | 30 days supply | Qty: 30 | Fill #2

## 2020-03-02 MED FILL — MONTELUKAST SOD 10 MG TAB: 10 | 30 days supply | Qty: 30 | Fill #3

## 2020-03-30 MED FILL — MONTELUKAST SOD 10 MG TAB: 10 | 30 days supply | Qty: 30 | Fill #4

## 2020-04-25 MED FILL — ?CLONIDINE HCL 0.2 MG TABS: 0.2 | 30 days supply | Qty: 30 | Fill #1

## 2020-04-25 MED FILL — MONTELUKAST SOD 10 MG TAB: 10 | 30 days supply | Qty: 30 | Fill #5

## 2020-05-24 MED FILL — MONTELUKAST SOD 10 MG TAB: 10 | 30 days supply | Qty: 30 | Fill #0

## 2020-05-24 MED FILL — cloNIDine HCL 0.2 MG TABS: 0.2 | 30 days supply | Qty: 30 | Fill #2

## 2020-06-07 MED FILL — AMLODIPINE BESYLATE 10 MG T: 10 | 90 days supply | Qty: 90 | Fill #1

## 2020-06-22 MED FILL — cloNIDine HCL 0.2 MG TABS: 0.2 | 30 days supply | Qty: 30 | Fill #3

## 2020-06-22 MED FILL — MONTELUKAST SOD 10 MG TAB: 10 | 30 days supply | Qty: 30 | Fill #1

## 2020-07-19 MED FILL — cloNIDine HCL 0.2 MG TABS: 0.2 | 30 days supply | Qty: 30 | Fill #4

## 2020-07-19 MED FILL — MONTELUKAST SOD 10 MG TAB: 10 | 30 days supply | Qty: 30 | Fill #2

## 2020-08-16 ENCOUNTER — Ambulatory Visit: Payer: Self-pay | Attending: Family Medicine | Admitting: Family Medicine

## 2020-08-16 ENCOUNTER — Other Ambulatory Visit: Payer: Self-pay | Admitting: Family Medicine

## 2020-08-16 ENCOUNTER — Other Ambulatory Visit: Payer: Self-pay

## 2020-08-16 ENCOUNTER — Encounter: Payer: Self-pay | Admitting: Family Medicine

## 2020-08-16 VITALS — BP 133/80 | HR 74 | Ht 64.0 in | Wt 119.2 lb

## 2020-08-16 DIAGNOSIS — Z7189 Other specified counseling: Secondary | ICD-10-CM

## 2020-08-16 DIAGNOSIS — I10 Essential (primary) hypertension: Secondary | ICD-10-CM

## 2020-08-16 DIAGNOSIS — Z720 Tobacco use: Secondary | ICD-10-CM

## 2020-08-16 DIAGNOSIS — E559 Vitamin D deficiency, unspecified: Secondary | ICD-10-CM

## 2020-08-16 DIAGNOSIS — J3089 Other allergic rhinitis: Secondary | ICD-10-CM

## 2020-08-16 DIAGNOSIS — Z780 Asymptomatic menopausal state: Secondary | ICD-10-CM

## 2020-08-16 DIAGNOSIS — J449 Chronic obstructive pulmonary disease, unspecified: Secondary | ICD-10-CM

## 2020-08-16 MED ORDER — FLUTICASONE PROPIONATE 50 MCG/ACT NA SUSP: 2.0000 | Freq: Every day | NASAL | 1 refills | Status: DC

## 2020-08-16 MED ORDER — INCRUSE ELLIPTA 62.5 MCG/INH IN AEPB: 1.0000 | INHALATION_SPRAY | Freq: Every day | RESPIRATORY_TRACT | 3 refills | Status: DC

## 2020-08-16 MED ORDER — VENLAFAXINE HCL ER 75 MG PO CP24: 75.0000 mg | ORAL_CAPSULE | Freq: Every day | ORAL | 1 refills | Status: DC

## 2020-08-16 MED ORDER — BUDESONIDE-FORMOTEROL FUMARATE 160-4.5 MCG/ACT IN AERO: 2.0000 | INHALATION_SPRAY | Freq: Two times a day (BID) | RESPIRATORY_TRACT | 3 refills | Status: DC

## 2020-08-16 MED ORDER — MONTELUKAST SODIUM 10 MG PO TABS: 10.0000 mg | ORAL_TABLET | Freq: Every day | ORAL | 1 refills | Status: DC

## 2020-08-16 MED ORDER — AMLODIPINE BESYLATE 10 MG PO TABS: 10.0000 mg | ORAL_TABLET | Freq: Every day | ORAL | 1 refills | Status: DC

## 2020-08-16 MED ORDER — NICOTINE 21 MG/24HR TD PT24: 21.0000 mg | MEDICATED_PATCH | Freq: Every day | TRANSDERMAL | 1 refills | Status: DC

## 2020-08-16 MED ORDER — CLONIDINE HCL 0.2 MG PO TABS: 0.2000 mg | ORAL_TABLET | Freq: Every day | ORAL | 6 refills | Status: DC

## 2020-08-16 MED FILL — VENLAFAXINE HCL ER 75 MG CA: 75 | 90 days supply | Qty: 90 | Fill #0

## 2020-08-16 MED FILL — FLUTICASONE PROP 50 MCG SPR: 50 | 30 days supply | Qty: 16 | Fill #0

## 2020-08-16 MED FILL — NICOTINE 21 MG/24HR PATCH: 21 | 28 days supply | Qty: 28 | Fill #0

## 2020-08-16 MED FILL — MONTELUKAST SOD 10 MG TAB: 10 | 90 days supply | Qty: 90 | Fill #0

## 2020-08-16 NOTE — Progress Notes (Signed)
Subjective:  Patient ID: Tanya Jordan, female    DOB: 22-Feb-1966  Age: 54 y.o. MRN: [id number]  CC: Hypertension   HPI [name] is 54 year old female with a history of COPD, alcohol abuse, tobacco abuse, seasonal allergies, hypertension who presents today for a follow-up. For COPD she has been compliant with Symbicort and uses Proventil daily but she has not used Incruse as prescribed though she has it at home; denies presence of COPD exacerbation. Her hypertension is controlled and she is tolerating her medications okay with no adverse effect of the medication. She has questions regarding the COVID-19 vaccine if she should receive it given her underlying she is also open to receiving the flu shot today.  Past Medical History:  Diagnosis Date  . Allergy   . COPD (chronic obstructive pulmonary disease) ([place])   . Family history of breast cancer   . Hypertension   . Personal history of colonic polyps     Past Surgical History:  Procedure Laterality Date  . BREAST BIOPSY Left   . REVISION OF SCAR ON FACE/HEAD      Family History  Problem Relation Age of Onset  . Lung cancer Father   . Hypertension Mother   . Breast cancer Maternal Grandmother 96  . Dementia Maternal Grandmother   . Diabetes Maternal Grandfather   . Colon cancer Neg Hx   . Stomach cancer Neg Hx     Allergies  Allergen Reactions  . Lisinopril Swelling    Outpatient Medications Prior to Visit  Medication Sig Dispense Refill  . albuterol (VENTOLIN HFA) 108 (90 Base) MCG/ACT inhaler INHALE 2 PUFFS INTO THE LUNGS EVERY 4 (FOUR) HOURS AS NEEDED FOR WHEEZING OR SHORTNESS OF BREATH. 54 g 3  . cetirizine (ZYRTEC) 10 MG tablet Take 1 tablet (10 mg total) by mouth daily. 30 tablet 1  . ergocalciferol (DRISDOL) 1.25 MG (50000 UT) capsule Take 1 capsule (50,000 Units total) by mouth once a week. 12 capsule 0  . amLODipine (NORVASC) 10 MG tablet Take 1 tablet (10 mg total) by mouth daily. 90 tablet 1  .  budesonide-formoterol (SYMBICORT) 160-4.5 MCG/ACT inhaler Inhale 2 puffs into the lungs 2 (two) times daily. 3 Inhaler 6  . cloNIDine (CATAPRES) 0.2 MG tablet Take 1 tablet (0.2 mg total) by mouth at bedtime. For hot flashes 30 tablet 6  . fluticasone (FLONASE) 50 MCG/ACT nasal spray Place 2 sprays into both nostrils daily. 16 g 1  . montelukast (SINGULAIR) 10 MG tablet Take 1 tablet (10 mg total) by mouth at bedtime. Please dispense #90 90 tablet 1  . umeclidinium bromide (INCRUSE ELLIPTA) 62.5 MCG/INH AEPB Inhale 1 puff into the lungs daily. 90 each 3  . venlafaxine XR (EFFEXOR XR) 75 MG 24 hr capsule Take 1 capsule (75 mg total) by mouth daily with breakfast. 90 capsule 1  . albuterol (PROVENTIL) (2.5 MG/3ML) 0.083% nebulizer solution Take 3 mLs (2.5 mg total) by nebulization every 6 (six) hours as needed for wheezing or shortness of breath. (Patient not taking: Reported on 01/12/2020) 75 mL 6  . mupirocin cream (BACTROBAN) 2 % Apply 1 application topically 2 (two) times daily. Apply to right ear (Patient not taking: Reported on 10/16/2017) 30 g 1  . nicotine (NICODERM CQ) 21 mg/24hr patch Place 1 patch (21 mg total) onto the skin daily. (Patient not taking: Reported on 08/16/2020) 28 patch 1   Facility-Administered Medications Prior to Visit  Medication Dose Route Frequency Provider Last Rate Last Admin  .  0.9 %  sodium chloride infusion  500 mL Intravenous Continuous Milus Banister, MD         ROS Review of Systems  Constitutional: Negative for activity change, appetite change and fatigue.  HENT: Negative for congestion, sinus pressure and sore throat.   Eyes: Negative for visual disturbance.  Respiratory: Negative for cough, chest tightness, shortness of breath and wheezing.   Cardiovascular: Negative for chest pain and palpitations.  Gastrointestinal: Negative for abdominal distention, abdominal pain and constipation.  Endocrine: Negative for polydipsia.  Genitourinary: Negative for  dysuria and frequency.  Musculoskeletal: Negative for arthralgias and back pain.  Skin: Negative for rash.  Neurological: Negative for tremors, light-headedness and numbness.  Hematological: Does not bruise/bleed easily.  Psychiatric/Behavioral: Negative for agitation and behavioral problems.    Objective:  BP 133/80   Pulse 74   Ht 5\' 4"  (1.626 m)   Wt 119 lb 3.2 oz (54.1 kg)   LMP 03/29/2011   SpO2 99%   BMI 20.46 kg/m   BP/Weight 08/16/2020 04/21/2019 27/06/8674  Systolic BP 449 201 007  Diastolic BP 80 82 73  Wt. (Lbs) 119.2 122.6 125.4  BMI 20.46 21.04 21.52      Physical Exam Constitutional:      Appearance: She is well-developed.  Neck:     Vascular: No JVD.  Cardiovascular:     Rate and Rhythm: Normal rate.     Heart sounds: Normal heart sounds. No murmur heard.   Pulmonary:     Effort: Pulmonary effort is normal.     Breath sounds: Normal breath sounds. No wheezing or rales.  Chest:     Chest wall: No tenderness.  Abdominal:     General: Bowel sounds are normal. There is no distension.     Palpations: Abdomen is soft. There is no mass.     Tenderness: There is no abdominal tenderness.  Musculoskeletal:        General: Normal range of motion.     Right lower leg: No edema.     Left lower leg: No edema.  Neurological:     Mental Status: She is alert and oriented to person, place, and time.  Psychiatric:        Mood and Affect: Mood normal.     CMP Latest Ref Rng & Units 01/19/2020 04/21/2019 08/01/2018  Glucose 65 - 99 mg/dL 97 81 78  BUN 6 - 24 mg/dL 8 12 6   Creatinine 0.57 - 1.00 mg/dL 0.87 0.90 0.79  Sodium 134 - 144 mmol/L 139 140 142  Potassium 3.5 - 5.2 mmol/L 4.1 4.2 4.2  Chloride 96 - 106 mmol/L 102 104 101  CO2 20 - 29 mmol/L 24 22 23   Calcium 8.7 - 10.2 mg/dL 9.4 9.4 9.4  Total Protein 6.0 - 8.5 g/dL 7.2 7.0 7.1  Total Bilirubin 0.0 - 1.2 mg/dL <0.2 <0.2 <0.2  Alkaline Phos 39 - 117 IU/L 96 81 88  AST 0 - 40 IU/L 17 15 14   ALT 0 - 32  IU/L 12 14 12     Lipid Panel     Component Value Date/Time   CHOL 218 (H) 01/19/2020 1413   TRIG 53 01/19/2020 1413   HDL 78 01/19/2020 1413   CHOLHDL 2.8 01/19/2020 1413   CHOLHDL 2.8 11/16/2016 0904   VLDL 16 11/16/2016 0904   LDLCALC 131 (H) 01/19/2020 1413    CBC    Component Value Date/Time   WBC 4.4 01/15/2016 0610   RBC 4.09 01/15/2016 0610  HGB 12.7 01/15/2016 0610   HCT 39.1 01/15/2016 0610   PLT 273 01/15/2016 0610   MCV 95.6 01/15/2016 0610   MCH 31.1 01/15/2016 0610   MCHC 32.5 01/15/2016 0610   RDW 13.5 01/15/2016 0610   LYMPHSABS 2.2 01/19/2011 1920   MONOABS 0.4 01/19/2011 1920   EOSABS 0.9 (H) 01/19/2011 1920   BASOSABS 0.0 01/19/2011 1920    Lab Results  Component Value Date   HGBA1C 5.4 06/17/2017    Assessment & Plan:  1. Essential hypertension Controlled Counseled on blood pressure goal of less than 130/80, low-sodium, DASH diet, medication compliance, 150 minutes of moderate intensity exercise per week. Discussed medication compliance, adverse effects. - amLODipine (NORVASC) 10 MG tablet; Take 1 tablet (10 mg total) by mouth daily.  Dispense: 90 tablet; Refill: 1 - Basic Metabolic Panel  2. COPD with chronic bronchitis ([place]) Stable Educated about proper use of inhalers including Symbicort and Incruse Use Ventolin MDI as needed - budesonide-formoterol (SYMBICORT) 160-4.5 MCG/ACT inhaler; Inhale 2 puffs into the lungs 2 (two) times daily.  Dispense: 10.2 g; Refill: 3 - umeclidinium bromide (INCRUSE ELLIPTA) 62.5 MCG/INH AEPB; Inhale 1 puff into the lungs daily.  Dispense: 90 each; Refill: 3  3. Menopause Stable - cloNIDine (CATAPRES) 0.2 MG tablet; Take 1 tablet (0.2 mg total) by mouth at bedtime. For hot flashes  Dispense: 30 tablet; Refill: 6 - venlafaxine XR (EFFEXOR XR) 75 MG 24 hr capsule; Take 1 capsule (75 mg total) by mouth daily with breakfast.  Dispense: 90 capsule; Refill: 1  4. Seasonal allergic rhinitis due to other allergic  trigger Controlled - fluticasone (FLONASE) 50 MCG/ACT nasal spray; Place 2 sprays into both nostrils daily.  Dispense: 16 g; Refill: 1 - montelukast (SINGULAIR) 10 MG tablet; Take 1 tablet (10 mg total) by mouth at bedtime. Please dispense #90  Dispense: 90 tablet; Refill: 1  5. Tobacco abuse Spent 3 minutes counseling on cessation and she is working on quitting - nicotine (NICODERM CQ) 21 mg/24hr patch; Place 1 patch (21 mg total) onto the skin daily.  Dispense: 28 patch; Refill: 1  6. Vitamin D deficiency Completed course of Drisdol - VITAMIN D 25 Hydroxy (Vit-D Deficiency, Fractures)   Counseled on the importance of receiving the COVID-19 and she promises to schedule an appointment Meds ordered this encounter  Medications  . amLODipine (NORVASC) 10 MG tablet    Sig: Take 1 tablet (10 mg total) by mouth daily.    Dispense:  90 tablet    Refill:  1    Please dispense #90  . budesonide-formoterol (SYMBICORT) 160-4.5 MCG/ACT inhaler    Sig: Inhale 2 puffs into the lungs 2 (two) times daily.    Dispense:  10.2 g    Refill:  3    Please dispense #90  . cloNIDine (CATAPRES) 0.2 MG tablet    Sig: Take 1 tablet (0.2 mg total) by mouth at bedtime. For hot flashes    Dispense:  30 tablet    Refill:  6    Dose increase  . fluticasone (FLONASE) 50 MCG/ACT nasal spray    Sig: Place 2 sprays into both nostrils daily.    Dispense:  16 g    Refill:  1  . montelukast (SINGULAIR) 10 MG tablet    Sig: Take 1 tablet (10 mg total) by mouth at bedtime. Please dispense #90    Dispense:  90 tablet    Refill:  1  . nicotine (NICODERM CQ) 21 mg/24hr patch  Sig: Place 1 patch (21 mg total) onto the skin daily.    Dispense:  28 patch    Refill:  1  . venlafaxine XR (EFFEXOR XR) 75 MG 24 hr capsule    Sig: Take 1 capsule (75 mg total) by mouth daily with breakfast.    Dispense:  90 capsule    Refill:  1    Please dispense #90  . umeclidinium bromide (INCRUSE ELLIPTA) 62.5 MCG/INH AEPB     Sig: Inhale 1 puff into the lungs daily.    Dispense:  90 each    Refill:  3    Follow-up: Return in about 6 months (around 02/13/2021) for chronic disease management.       Charlott Rakes, MD, FAAFP. [facility] and [place], [place]   08/16/2020, 3:06 PM

## 2020-08-17 ENCOUNTER — Other Ambulatory Visit: Payer: Self-pay | Admitting: Family Medicine

## 2020-08-17 DIAGNOSIS — E559 Vitamin D deficiency, unspecified: Secondary | ICD-10-CM

## 2020-08-17 LAB — BASIC METABOLIC PANEL
BUN/Creatinine Ratio: 13 (ref 9–23)
BUN: 11 mg/dL (ref 6–24)
CO2: 24 mmol/L (ref 20–29)
Calcium: 9.4 mg/dL (ref 8.7–10.2)
Chloride: 102 mmol/L (ref 96–106)
Creatinine, Ser: 0.84 mg/dL (ref 0.57–1.00)
GFR calc Af Amer: 90 mL/min/{1.73_m2} (ref >59)
GFR calc non Af Amer: 78 mL/min/{1.73_m2} (ref >59)
Glucose: 76 mg/dL (ref 65–99)
Potassium: 4.1 mmol/L (ref 3.5–5.2)
Sodium: 139 mmol/L (ref 134–144)

## 2020-08-17 LAB — VITAMIN D 25 HYDROXY (VIT D DEFICIENCY, FRACTURES): Vit D, 25-Hydroxy: 16.4 ng/mL — ABNORMAL LOW (ref 30.0–100.0)

## 2020-08-17 MED ORDER — ERGOCALCIFEROL 1.25 MG (50000 UT) PO CAPS: 50000.0000 [IU] | ORAL_CAPSULE | ORAL | 0 refills | Status: DC

## 2020-08-17 MED FILL — VIT D2 1.25 MG (50,000 UNIT: 1.25 MG | 84 days supply | Qty: 12 | Fill #0

## 2020-08-17 MED FILL — cloNIDine HCL 0.2 MG TABS: 0.2 | 30 days supply | Qty: 30 | Fill #0

## 2020-08-17 MED FILL — !SYMBICORT 160-4.5 MCG INH: 160-4.5 | 30 days supply | Qty: 1 | Fill #0

## 2020-08-18 ENCOUNTER — Telehealth: Payer: Self-pay

## 2020-08-18 NOTE — Telephone Encounter (Signed)
Patient would like a call back from [place] to discuss labs. Ph# [phone]

## 2020-08-18 NOTE — Telephone Encounter (Signed)
-----   Message from Charlott Rakes, MD sent at 08/17/2020  8:16 AM EDT ----- Labs reveal low vitamin D and I have sent a refill for Drisdol to her Pharmacy. Other labs are stable.

## 2020-08-18 NOTE — Telephone Encounter (Signed)
Left detailed VM with lab and prescription information. Recording stated correct name.

## 2020-08-18 NOTE — Telephone Encounter (Signed)
Patient was called and informed of lab results via voicemail.

## 2020-08-24 MED FILL — MONTELUKAST SOD 10 MG TAB: 10 | 30 days supply | Qty: 30 | Fill #3

## 2020-09-08 MED FILL — AMLODIPINE BESYLATE 10 MG T: 10 | 90 days supply | Qty: 90 | Fill #0

## 2020-09-21 MED FILL — cloNIDine HCL 0.2 MG TABS: 0.2 | 30 days supply | Qty: 30 | Fill #1

## 2020-09-21 MED FILL — MONTELUKAST SOD 10 MG TAB: 10 | 30 days supply | Qty: 30 | Fill #4

## 2020-10-18 MED FILL — MONTELUKAST SOD 10 MG TAB: 10 | 30 days supply | Qty: 30 | Fill #5

## 2020-10-18 MED FILL — cloNIDine HCL 0.2 MG TABS: 0.2 | 30 days supply | Qty: 30 | Fill #2

## 2020-11-15 MED FILL — MONTELUKAST SOD 10 MG TAB: 10 | 90 days supply | Qty: 90 | Fill #0

## 2020-11-15 MED FILL — cloNIDine HCL 0.2 MG TABS: 0.2 | 30 days supply | Qty: 30 | Fill #3

## 2020-12-06 MED FILL — AMLODIPINE BESYLATE 10 MG T: 10 | 90 days supply | Qty: 90 | Fill #1

## 2020-12-12 ENCOUNTER — Other Ambulatory Visit: Payer: Self-pay | Admitting: Family Medicine

## 2020-12-12 DIAGNOSIS — J449 Chronic obstructive pulmonary disease, unspecified: Secondary | ICD-10-CM

## 2020-12-13 ENCOUNTER — Other Ambulatory Visit: Payer: Self-pay | Admitting: Family Medicine

## 2020-12-14 MED FILL — ALBUTEROL SULFATE HFA 108 (: 108 (90 BAS | 16 days supply | Qty: 18 | Fill #0

## 2020-12-20 MED FILL — cloNIDine HCL 0.2 MG TABS: 0.2 | 30 days supply | Qty: 30 | Fill #4

## 2021-01-18 MED FILL — cloNIDine HCL 0.2 MG TABS: 0.2 | 30 days supply | Qty: 30 | Fill #5

## 2021-03-06 ENCOUNTER — Other Ambulatory Visit: Payer: Self-pay

## 2021-03-06 ENCOUNTER — Other Ambulatory Visit: Payer: Self-pay | Admitting: Family Medicine

## 2021-03-06 DIAGNOSIS — I10 Essential (primary) hypertension: Secondary | ICD-10-CM

## 2021-03-06 MED ORDER — AMLODIPINE BESYLATE 10 MG PO TABS
ORAL_TABLET | Freq: Every day | ORAL | 0 refills | Status: DC
  Filled 2021-03-06: qty 30, 30d supply, fill #0

## 2021-03-06 NOTE — Telephone Encounter (Signed)
Requested Prescriptions  Pending Prescriptions Disp Refills  . amLODipine (NORVASC) 10 MG tablet 90 tablet 0    Sig: TAKE 1 TABLET (10 MG TOTAL) BY MOUTH DAILY.     Cardiovascular:  Calcium Channel Blockers Failed - 03/06/2021  2:05 PM      Failed - Valid encounter within last 6 months    Recent Outpatient Visits          6 months ago Vitamin D deficiency   [place], Enobong, MD   1 year ago Vitamin D deficiency   [place], Enobong, MD   1 year ago Essential hypertension   [place], Enobong, MD   1 year ago Vitamin D deficiency   [place], Enobong, MD   2 years ago Chronic right shoulder pain   [place], Enobong, MD      Future Appointments            In 3 weeks Tanya Rakes, MD [place] BP in normal range    BP Readings from Last 1 Encounters:  08/16/20 133/80

## 2021-03-07 ENCOUNTER — Other Ambulatory Visit: Payer: Self-pay

## 2021-03-10 ENCOUNTER — Other Ambulatory Visit: Payer: Self-pay

## 2021-03-17 ENCOUNTER — Other Ambulatory Visit: Payer: Self-pay | Admitting: Family Medicine

## 2021-03-17 DIAGNOSIS — Z780 Asymptomatic menopausal state: Secondary | ICD-10-CM

## 2021-03-17 MED ORDER — CLONIDINE HCL 0.2 MG PO TABS
ORAL_TABLET | ORAL | 0 refills | Status: DC
  Filled 2021-03-17: qty 30, 30d supply, fill #0

## 2021-03-20 ENCOUNTER — Other Ambulatory Visit: Payer: Self-pay

## 2021-03-22 ENCOUNTER — Other Ambulatory Visit: Payer: Self-pay

## 2021-03-28 ENCOUNTER — Encounter: Payer: Self-pay | Admitting: Family Medicine

## 2021-03-28 ENCOUNTER — Ambulatory Visit: Payer: 59 | Attending: Family Medicine | Admitting: Family Medicine

## 2021-03-28 ENCOUNTER — Other Ambulatory Visit: Payer: Self-pay

## 2021-03-28 DIAGNOSIS — J3089 Other allergic rhinitis: Secondary | ICD-10-CM | POA: Diagnosis not present

## 2021-03-28 DIAGNOSIS — Z780 Asymptomatic menopausal state: Secondary | ICD-10-CM

## 2021-03-28 DIAGNOSIS — I10 Essential (primary) hypertension: Secondary | ICD-10-CM

## 2021-03-28 DIAGNOSIS — J449 Chronic obstructive pulmonary disease, unspecified: Secondary | ICD-10-CM

## 2021-03-28 MED ORDER — CLONIDINE HCL 0.2 MG PO TABS
ORAL_TABLET | ORAL | 1 refills | Status: DC
  Filled 2021-03-28: qty 90, fill #0
  Filled 2021-04-18: qty 30, 30d supply, fill #0
  Filled 2021-05-17: qty 30, 30d supply, fill #1
  Filled 2021-06-18: qty 30, 30d supply, fill #2
  Filled 2021-07-17: qty 30, 30d supply, fill #3
  Filled 2021-08-16: qty 30, 30d supply, fill #4
  Filled 2021-09-15 – 2021-09-16 (×2): qty 30, 30d supply, fill #5

## 2021-03-28 MED ORDER — AMLODIPINE BESYLATE 10 MG PO TABS
ORAL_TABLET | Freq: Every day | ORAL | 1 refills | Status: DC
  Filled 2021-03-28: qty 90, fill #0
  Filled 2021-04-06: qty 30, 30d supply, fill #0
  Filled 2021-05-10: qty 30, 30d supply, fill #1
  Filled 2021-06-09: qty 30, 30d supply, fill #2
  Filled 2021-07-10: qty 30, 30d supply, fill #3
  Filled 2021-08-09: qty 30, 30d supply, fill #4
  Filled 2021-09-07: qty 30, 30d supply, fill #5

## 2021-03-28 MED ORDER — BUDESONIDE-FORMOTEROL FUMARATE 160-4.5 MCG/ACT IN AERO
2.0000 | INHALATION_SPRAY | Freq: Two times a day (BID) | RESPIRATORY_TRACT | 3 refills | Status: DC
  Filled 2021-03-28 – 2021-04-06 (×2): qty 10.2, 30d supply, fill #0

## 2021-03-28 MED ORDER — ALBUTEROL SULFATE HFA 108 (90 BASE) MCG/ACT IN AERS
2.0000 | INHALATION_SPRAY | RESPIRATORY_TRACT | 6 refills | Status: DC | PRN
  Filled 2021-03-28: qty 18, 16d supply, fill #0
  Filled 2021-05-10: qty 18, 16d supply, fill #1
  Filled 2021-06-18: qty 18, 16d supply, fill #2
  Filled 2021-07-17: qty 18, 16d supply, fill #3
  Filled 2021-08-24: qty 18, 16d supply, fill #4
  Filled 2021-09-28: qty 18, 16d supply, fill #5
  Filled 2021-10-23: qty 18, 16d supply, fill #6

## 2021-03-28 MED ORDER — MONTELUKAST SODIUM 10 MG PO TABS
ORAL_TABLET | Freq: Every day | ORAL | 1 refills | Status: DC
  Filled 2021-03-28: qty 30, 30d supply, fill #0
  Filled 2021-06-27: qty 30, 30d supply, fill #1
  Filled 2021-07-25: qty 30, 30d supply, fill #2
  Filled 2021-08-24: qty 30, 30d supply, fill #3
  Filled 2021-09-28: qty 30, 30d supply, fill #4
  Filled 2021-10-23: qty 30, 30d supply, fill #5

## 2021-03-28 NOTE — Patient Instructions (Signed)
https://www.womenshealth.gov/menopause/menopause-basics"> https://www.clinicalkey.com">  Menopause Menopause is the normal time of a woman's life when menstrual periods stop completely. It marks the natural end to a woman's ability to become pregnant. It can be defined as the absence of a menstrual period for 12 months without another medical cause. The transition to menopause (perimenopause) most often happens between the ages of 45 and 55, and can last for many years. During perimenopause, hormone levels change in your body, which can cause symptoms and affect your health. Menopause may increase your risk for:  Weakened bones (osteoporosis), which causes fractures.  Depression.  Hardening and narrowing of the arteries (atherosclerosis), which can cause heart attacks and strokes. What are the causes? This condition is usually caused by a natural change in hormone levels that happens as you get older. The condition may also be caused by changes that are not natural, including:  Surgery to remove both ovaries (surgical menopause).  Side effects from some medicines, such as chemotherapy used to treat cancer (chemical menopause). What increases the risk? This condition is more likely to start at an earlier age if you have certain medical conditions or have undergone treatments, including:  A tumor of the pituitary gland in the brain.  A disease that affects the ovaries and hormones.  Certain cancer treatments, such as chemotherapy or hormone therapy, or radiation therapy on the pelvis.  Heavy smoking and excessive alcohol use.  Family history of early menopause. This condition is also more likely to develop earlier in women who are very thin. What are the signs or symptoms? Symptoms of this condition include:  Hot flashes.  Irregular menstrual periods.  Night sweats.  Changes in feelings about sex. This could be a decrease in sex drive or an increased discomfort around your  sexuality.  Vaginal dryness and thinning of the vaginal walls. This may cause painful sex.  Dryness of the skin and development of wrinkles.  Headaches.  Problems sleeping (insomnia).  Mood swings or irritability.  Memory problems.  Weight gain.  Hair growth on the face and chest.  Bladder infections or problems with urinating. How is this diagnosed? This condition is diagnosed based on your medical history, a physical exam, your age, your menstrual history, and your symptoms. Hormone tests may also be done. How is this treated? In some cases, no treatment is needed. You and your health care provider should make a decision together about whether treatment is necessary. Treatment will be based on your individual condition and preferences. Treatment for this condition focuses on managing symptoms. Treatment may include:  Menopausal hormone therapy (MHT).  Medicines to treat specific symptoms or complications.  Acupuncture.  Vitamin or herbal supplements. Before starting treatment, make sure to let your health care provider know if you have a personal or family history of these conditions:  Heart disease.  Breast cancer.  Blood clots.  Diabetes.  Osteoporosis. Follow these instructions at home: Lifestyle  Do not use any products that contain nicotine or tobacco, such as cigarettes, e-cigarettes, and chewing tobacco. If you need help quitting, ask your health care provider.  Get at least 30 minutes of physical activity on 5 or more days each week.  Avoid alcoholic and caffeinated beverages, as well as spicy foods. This may help prevent hot flashes.  Get 7-8 hours of sleep each night.  If you have hot flashes, try: ? Dressing in layers. ? Avoiding things that may trigger hot flashes, such as spicy food, warm places, or stress. ? Taking slow, deep   breaths when a hot flash starts. ? Keeping a fan in your home and office.  Find ways to manage stress, such as deep  breathing, meditation, or journaling.  Consider going to group therapy with other women who are having menopause symptoms. Ask your health care provider about recommended group therapy meetings. Eating and drinking  Eat a healthy, balanced diet that contains whole grains, lean protein, low-fat dairy, and plenty of fruits and vegetables.  Your health care provider may recommend adding more soy to your diet. Foods that contain soy include tofu, tempeh, and soy milk.  Eat plenty of foods that contain calcium and vitamin D for bone health. Items that are rich in calcium include low-fat milk, yogurt, beans, almonds, sardines, broccoli, and kale.   Medicines  Take over-the-counter and prescription medicines only as told by your health care provider.  Talk with your health care provider before starting any herbal supplements. If prescribed, take vitamins and supplements as told by your health care provider. General instructions  Keep track of your menstrual periods, including: ? When they occur. ? How heavy they are and how long they last. ? How much time passes between periods.  Keep track of your symptoms, noting when they start, how often you have them, and how long they last.  Use vaginal lubricants or moisturizers to help with vaginal dryness and improve comfort during sex.  Keep all follow-up visits. This is important. This includes any group therapy or counseling.   Contact a health care provider if:  You are still having menstrual periods after age 55.  You have pain during sex.  You have not had a period for 12 months and you develop vaginal bleeding. Get help right away if you have:  Severe depression.  Excessive vaginal bleeding.  Pain when you urinate.  A fast or irregular heartbeat (palpitations).  Severe headaches.  Abdominal pain or severe indigestion. Summary  Menopause is a normal time of life when menstrual periods stop completely. It is usually defined as  the absence of a menstrual period for 12 months without another medical cause.  The transition to menopause (perimenopause) most often happens between the ages of 45 and 55 and can last for several years.  Symptoms can be managed through medicines, lifestyle changes, and complementary therapies such as acupuncture.  Eat a balanced diet that is rich in nutrients to promote bone health and heart health and to manage symptoms during menopause. This information is not intended to replace advice given to you by your health care provider. Make sure you discuss any questions you have with your health care provider. Document Revised: 08/19/2020 Document Reviewed: 05/05/2020 Elsevier Patient Education  2021 [employer]  

## 2021-03-28 NOTE — Progress Notes (Signed)
Subjective:  Patient ID: Tanya Jordan, female    DOB: 06/24/1966  Age: 55 y.o. MRN: [id number]  CC: Hypertension   HPI Tanya Jordan is 55 year old female with a history of COPD, alcohol abuse, tobacco abuse, seasonal allergies, hypertension who presents today for a follow-up.  Someone took her Incruse package off her front porch she usually receives it in the mail from the drug company.  Patient assistance program. Currently on Symbicort, Singulair and albuterol. Longer takes Effexor as she complains it made her more depressed and cough that does not easily.  She is on clonidine for vasomotor symptoms of menopause. Compliant with her antihypertensive but endorses being stressed at home. Not ready to quit smoking. Past Medical History:  Diagnosis Date  . Allergy   . COPD (chronic obstructive pulmonary disease) ([place])   . Family history of breast cancer   . Hypertension   . Personal history of colonic polyps     Past Surgical History:  Procedure Laterality Date  . BREAST BIOPSY Left   . REVISION OF SCAR ON FACE/HEAD      Family History  Problem Relation Age of Onset  . Lung cancer Father   . Hypertension Mother   . Breast cancer Maternal Grandmother 96  . Dementia Maternal Grandmother   . Diabetes Maternal Grandfather   . Colon cancer Neg Hx   . Stomach cancer Neg Hx     Allergies  Allergen Reactions  . Lisinopril Swelling    Outpatient Medications Prior to Visit  Medication Sig Dispense Refill  . cetirizine (ZYRTEC) 10 MG tablet Take 1 tablet (10 mg total) by mouth daily. 30 tablet 1  . ergocalciferol (DRISDOL) 1.25 MG (50000 UT) capsule Take 1 capsule (50,000 Units total) by mouth once a week. 12 capsule 0  . fluticasone (FLONASE) 50 MCG/ACT nasal spray Place 2 sprays into both nostrils daily. 16 g 1  . nicotine (NICODERM CQ) 21 mg/24hr patch Place 1 patch (21 mg total) onto the skin daily. 28 patch 1  . umeclidinium bromide (INCRUSE ELLIPTA) 62.5 MCG/INH  AEPB Inhale 1 puff into the lungs daily. 90 each 3  . albuterol (VENTOLIN HFA) 108 (90 Base) MCG/ACT inhaler INHALE 2 PUFFS INTO THE LUNGS EVERY 4 (FOUR) HOURS AS NEEDED FOR WHEEZING OR SHORTNESS OF BREATH. 18 g 2  . amLODipine (NORVASC) 10 MG tablet TAKE 1 TABLET (10 MG TOTAL) BY MOUTH DAILY. 90 tablet 0  . budesonide-formoterol (SYMBICORT) 160-4.5 MCG/ACT inhaler Inhale 2 puffs into the lungs 2 (two) times daily. 10.2 g 3  . cloNIDine (CATAPRES) 0.2 MG tablet TAKE 1 TABLET (0.2 MG TOTAL) BY MOUTH AT BEDTIME FOR HOT FLASHES 30 tablet 0  . montelukast (SINGULAIR) 10 MG tablet TAKE 1 TABLET (10 MG TOTAL) BY MOUTH AT BEDTIME. 90 tablet 1  . venlafaxine XR (EFFEXOR XR) 75 MG 24 hr capsule Take 1 capsule (75 mg total) by mouth daily with breakfast. 90 capsule 1  . albuterol (PROVENTIL) (2.5 MG/3ML) 0.083% nebulizer solution Take 3 mLs (2.5 mg total) by nebulization every 6 (six) hours as needed for wheezing or shortness of breath. (Patient not taking: Reported on 01/12/2020) 75 mL 6  . mupirocin cream (BACTROBAN) 2 % Apply 1 application topically 2 (two) times daily. Apply to right ear (Patient not taking: Reported on 10/16/2017) 30 g 1   Facility-Administered Medications Prior to Visit  Medication Dose Route Frequency Provider Last Rate Last Admin  . 0.9 %  sodium chloride infusion  500 mL Intravenous  Continuous Milus Banister, MD         ROS Review of Systems  Constitutional: Negative for activity change, appetite change and fatigue.  HENT: Negative for congestion, sinus pressure and sore throat.   Eyes: Negative for visual disturbance.  Respiratory: Negative for cough, chest tightness, shortness of breath and wheezing.   Cardiovascular: Negative for chest pain and palpitations.  Gastrointestinal: Negative for abdominal distention, abdominal pain and constipation.  Endocrine: Negative for polydipsia.  Genitourinary: Negative for dysuria and frequency.  Musculoskeletal: Negative for  arthralgias and back pain.  Skin: Negative for rash.  Neurological: Negative for tremors, light-headedness and numbness.  Hematological: Does not bruise/bleed easily.  Psychiatric/Behavioral: Negative for agitation and behavioral problems.    Objective:  BP (!) 144/84   Pulse 83   Resp 19   Ht 5\' 5"  (1.651 m)   Wt 117 lb 12.8 oz (53.4 kg)   LMP 03/29/2011   SpO2 98%   BMI 19.60 kg/m   BP/Weight 03/28/2021 08/16/2020 1/47/8295  Systolic BP 621 308 657  Diastolic BP 84 80 82  Wt. (Lbs) 117.8 119.2 122.6  BMI 19.6 20.46 21.04      Physical Exam Constitutional:      Appearance: She is well-developed.  Neck:     Vascular: No JVD.  Cardiovascular:     Rate and Rhythm: Normal rate.     Heart sounds: Normal heart sounds. No murmur heard.   Pulmonary:     Effort: Pulmonary effort is normal.     Breath sounds: Normal breath sounds. No wheezing or rales.  Chest:     Chest wall: No tenderness.  Abdominal:     General: Bowel sounds are normal. There is no distension.     Palpations: Abdomen is soft. There is no mass.     Tenderness: There is no abdominal tenderness.  Musculoskeletal:        General: Normal range of motion.     Right lower leg: No edema.     Left lower leg: No edema.  Neurological:     Mental Status: She is alert and oriented to person, place, and time.  Psychiatric:        Mood and Affect: Mood normal.     CMP Latest Ref Rng & Units 08/16/2020 01/19/2020 04/21/2019  Glucose 65 - 99 mg/dL 76 97 81  BUN 6 - 24 mg/dL 11 8 12   Creatinine 0.57 - 1.00 mg/dL 0.84 0.87 0.90  Sodium 134 - 144 mmol/L 139 139 140  Potassium 3.5 - 5.2 mmol/L 4.1 4.1 4.2  Chloride 96 - 106 mmol/L 102 102 104  CO2 20 - 29 mmol/L 24 24 22   Calcium 8.7 - 10.2 mg/dL 9.4 9.4 9.4  Total Protein 6.0 - 8.5 g/dL - 7.2 7.0  Total Bilirubin 0.0 - 1.2 mg/dL - <0.2 <0.2  Alkaline Phos 39 - 117 IU/L - 96 81  AST 0 - 40 IU/L - 17 15  ALT 0 - 32 IU/L - 12 14    Lipid Panel     Component  Value Date/Time   CHOL 218 (H) 01/19/2020 1413   TRIG 53 01/19/2020 1413   HDL 78 01/19/2020 1413   CHOLHDL 2.8 01/19/2020 1413   CHOLHDL 2.8 11/16/2016 0904   VLDL 16 11/16/2016 0904   LDLCALC 131 (H) 01/19/2020 1413    CBC    Component Value Date/Time   WBC 4.4 01/15/2016 0610   RBC 4.09 01/15/2016 0610   HGB 12.7 01/15/2016 0610  HCT 39.1 01/15/2016 0610   PLT 273 01/15/2016 0610   MCV 95.6 01/15/2016 0610   MCH 31.1 01/15/2016 0610   MCHC 32.5 01/15/2016 0610   RDW 13.5 01/15/2016 0610   LYMPHSABS 2.2 01/19/2011 1920   MONOABS 0.4 01/19/2011 1920   EOSABS 0.9 (H) 01/19/2011 1920   BASOSABS 0.0 01/19/2011 1920    Lab Results  Component Value Date   HGBA1C 5.4 06/17/2017    Assessment & Plan:  1. COPD with chronic bronchitis ([place]) Suboptimally controlled due to running out of Incruse Unfortunately treatments are done every 3 months from the drug company and she will not be receiving another shipment till the next 2 months Smoking cessation will be beneficial - albuterol (VENTOLIN HFA) 108 (90 Base) MCG/ACT inhaler; INHALE 2 PUFFS INTO THE LUNGS EVERY 4 (FOUR) HOURS AS NEEDED FOR WHEEZING OR SHORTNESS OF BREATH.  Dispense: 18 g; Refill: 6 - budesonide-formoterol (SYMBICORT) 160-4.5 MCG/ACT inhaler; Inhale 2 puffs into the lungs 2 (two) times daily.  Dispense: 10.2 g; Refill: 3  2. Essential hypertension Slightly elevated She attributes this to stress at home No regimen change today Counseled on blood pressure goal of less than 130/80, low-sodium, DASH diet, medication compliance, 150 minutes of moderate intensity exercise per week. Discussed medication compliance, adverse effects. - amLODipine (NORVASC) 10 MG tablet; TAKE 1 TABLET (10 MG TOTAL) BY MOUTH DAILY.  Dispense: 90 tablet; Refill: 1  3. Seasonal allergic rhinitis due to other allergic trigger Stable - montelukast (SINGULAIR) 10 MG tablet; TAKE 1 TABLET (10 MG TOTAL) BY MOUTH AT BEDTIME.  Dispense: 90  tablet; Refill: 1  4. Menopause Stable Will discontinue Effexor due to patient intolerance - cloNIDine (CATAPRES) 0.2 MG tablet; TAKE 1 TABLET (0.2 MG TOTAL) BY MOUTH AT BEDTIME FOR HOT FLASHES  Dispense: 90 tablet; Refill: 1    Meds ordered this encounter  Medications  . albuterol (VENTOLIN HFA) 108 (90 Base) MCG/ACT inhaler    Sig: INHALE 2 PUFFS INTO THE LUNGS EVERY 4 (FOUR) HOURS AS NEEDED FOR WHEEZING OR SHORTNESS OF BREATH.    Dispense:  18 g    Refill:  6  . amLODipine (NORVASC) 10 MG tablet    Sig: TAKE 1 TABLET (10 MG TOTAL) BY MOUTH DAILY.    Dispense:  90 tablet    Refill:  1  . budesonide-formoterol (SYMBICORT) 160-4.5 MCG/ACT inhaler    Sig: Inhale 2 puffs into the lungs 2 (two) times daily.    Dispense:  10.2 g    Refill:  3    Please dispense #90  . montelukast (SINGULAIR) 10 MG tablet    Sig: TAKE 1 TABLET (10 MG TOTAL) BY MOUTH AT BEDTIME.    Dispense:  90 tablet    Refill:  1  . cloNIDine (CATAPRES) 0.2 MG tablet    Sig: TAKE 1 TABLET (0.2 MG TOTAL) BY MOUTH AT BEDTIME FOR HOT FLASHES    Dispense:  90 tablet    Refill:  1    Follow-up: Return in about 1 month (around 04/27/2021) for Complete physical exam.       Charlott Rakes, MD, FAAFP. [facility] and [place], [place]   03/29/2021, 11:34 AM

## 2021-03-30 ENCOUNTER — Other Ambulatory Visit: Payer: Self-pay

## 2021-04-06 ENCOUNTER — Other Ambulatory Visit: Payer: Self-pay

## 2021-04-18 ENCOUNTER — Other Ambulatory Visit: Payer: Self-pay

## 2021-04-21 ENCOUNTER — Other Ambulatory Visit: Payer: Self-pay

## 2021-05-11 ENCOUNTER — Other Ambulatory Visit: Payer: Self-pay

## 2021-05-15 ENCOUNTER — Telehealth: Payer: Self-pay | Admitting: Family Medicine

## 2021-05-15 ENCOUNTER — Encounter (INDEPENDENT_AMBULATORY_CARE_PROVIDER_SITE_OTHER): Payer: Self-pay

## 2021-05-15 ENCOUNTER — Other Ambulatory Visit: Payer: Self-pay

## 2021-05-15 ENCOUNTER — Encounter: Payer: Self-pay | Admitting: Family Medicine

## 2021-05-15 ENCOUNTER — Ambulatory Visit: Payer: 59 | Attending: Family Medicine | Admitting: Family Medicine

## 2021-05-15 VITALS — BP 130/79 | HR 75 | Ht 64.0 in | Wt 119.2 lb

## 2021-05-15 DIAGNOSIS — Z23 Encounter for immunization: Secondary | ICD-10-CM | POA: Diagnosis not present

## 2021-05-15 DIAGNOSIS — Z0000 Encounter for general adult medical examination without abnormal findings: Secondary | ICD-10-CM | POA: Diagnosis not present

## 2021-05-15 DIAGNOSIS — Z1159 Encounter for screening for other viral diseases: Secondary | ICD-10-CM

## 2021-05-15 DIAGNOSIS — Z124 Encounter for screening for malignant neoplasm of cervix: Secondary | ICD-10-CM

## 2021-05-15 DIAGNOSIS — Z1231 Encounter for screening mammogram for malignant neoplasm of breast: Secondary | ICD-10-CM

## 2021-05-15 NOTE — Telephone Encounter (Signed)
Pt is calling to get shingles vaccine scheduled. Please advise CB- [phone]

## 2021-05-15 NOTE — Progress Notes (Signed)
Subjective:  Patient ID: Tanya Jordan, female    DOB: 02-12-1966  Age: 55 y.o. MRN: [id number]  CC: Annual Exam and Gynecologic Exam   HPI Tanya Jordan is 55 year old female with a history of COPD, alcohol abuse, tobacco abuse, seasonal allergies, hypertension who presents today for a complete physical exam.  Interval History: She is due for Pap smear and mammogram Last colonoscopy was in 2018 and polyps resected with pathology revealing tubular adenoma, no high-grade malignancy or dysplasia.  Note from GI had indicated need for EGD.  Past Medical History:  Diagnosis Date   Allergy    COPD (chronic obstructive pulmonary disease) ([facility])    Family history of breast cancer    Hypertension    Personal history of colonic polyps     Past Surgical History:  Procedure Laterality Date   BREAST BIOPSY Left    REVISION OF SCAR ON FACE/HEAD      Family History  Problem Relation Age of Onset   Lung cancer Father    Hypertension Mother    Breast cancer Maternal Grandmother 96   Dementia Maternal Grandmother    Diabetes Maternal Grandfather    Colon cancer Neg Hx    Stomach cancer Neg Hx     Allergies  Allergen Reactions   Lisinopril Swelling    Outpatient Medications Prior to Visit  Medication Sig Dispense Refill   albuterol (VENTOLIN HFA) 108 (90 Base) MCG/ACT inhaler INHALE 2 PUFFS INTO THE LUNGS EVERY 4 (FOUR) HOURS AS NEEDED FOR WHEEZING OR SHORTNESS OF BREATH. 18 g 6   amLODipine (NORVASC) 10 MG tablet TAKE 1 TABLET (10 MG TOTAL) BY MOUTH DAILY. 90 tablet 1   budesonide-formoterol (SYMBICORT) 160-4.5 MCG/ACT inhaler Inhale 2 puffs into the lungs 2 (two) times daily. 10.2 g 3   cetirizine (ZYRTEC) 10 MG tablet Take 1 tablet (10 mg total) by mouth daily. 30 tablet 1   cloNIDine (CATAPRES) 0.2 MG tablet TAKE 1 TABLET (0.2 MG TOTAL) BY MOUTH AT BEDTIME FOR HOT FLASHES 90 tablet 1   ergocalciferol (DRISDOL) 1.25 MG (50000 UT) capsule Take 1 capsule (50,000 Units total) by  mouth once a week. 12 capsule 0   fluticasone (FLONASE) 50 MCG/ACT nasal spray Place 2 sprays into both nostrils daily. 16 g 1   montelukast (SINGULAIR) 10 MG tablet TAKE 1 TABLET (10 MG TOTAL) BY MOUTH AT BEDTIME. 90 tablet 1   mupirocin cream (BACTROBAN) 2 % Apply 1 application topically 2 (two) times daily. Apply to right ear 30 g 1   nicotine (NICODERM CQ) 21 mg/24hr patch Place 1 patch (21 mg total) onto the skin daily. 28 patch 1   umeclidinium bromide (INCRUSE ELLIPTA) 62.5 MCG/INH AEPB Inhale 1 puff into the lungs daily. 90 each 3   albuterol (PROVENTIL) (2.5 MG/3ML) 0.083% nebulizer solution Take 3 mLs (2.5 mg total) by nebulization every 6 (six) hours as needed for wheezing or shortness of breath. (Patient not taking: No sig reported) 75 mL 6   Facility-Administered Medications Prior to Visit  Medication Dose Route Frequency Provider Last Rate Last Admin   0.9 %  sodium chloride infusion  500 mL Intravenous Continuous Milus Banister, MD         ROS Review of Systems  Constitutional:  Negative for activity change, appetite change and fatigue.  HENT:  Negative for congestion, sinus pressure and sore throat.   Eyes:  Negative for visual disturbance.  Respiratory:  Negative for cough, chest tightness, shortness of breath and  wheezing.   Cardiovascular:  Negative for chest pain and palpitations.  Gastrointestinal:  Negative for abdominal distention, abdominal pain and constipation.  Endocrine: Negative for polydipsia.  Genitourinary:  Negative for dysuria and frequency.  Musculoskeletal:  Negative for arthralgias and back pain.  Skin:  Negative for rash.  Neurological:  Negative for tremors, light-headedness and numbness.  Hematological:  Does not bruise/bleed easily.  Psychiatric/Behavioral:  Negative for agitation and behavioral problems.    Objective:  BP 130/79   Pulse 75   Ht 5' 4"  (1.626 m)   Wt 119 lb 3.2 oz (54.1 kg)   LMP 03/29/2011   SpO2 99%   BMI 20.46 kg/m    BP/Weight 05/15/2021 03/28/2021 1/61/0960  Systolic BP 454 098 119  Diastolic BP 79 84 80  Wt. (Lbs) 119.2 117.8 119.2  BMI 20.46 19.6 20.46      Physical Exam Constitutional:      General: She is not in acute distress.    Appearance: She is well-developed. She is not diaphoretic.  HENT:     Head: Normocephalic.     Right Ear: External ear normal.     Left Ear: External ear normal.     Nose: Nose normal.  Eyes:     Conjunctiva/sclera: Conjunctivae normal.     Pupils: Pupils are equal, round, and reactive to light.  Neck:     Vascular: No JVD.  Cardiovascular:     Rate and Rhythm: Normal rate and regular rhythm.     Heart sounds: Normal heart sounds. No murmur heard.   No gallop.  Pulmonary:     Effort: Pulmonary effort is normal. No respiratory distress.     Breath sounds: Normal breath sounds. No wheezing or rales.  Chest:     Chest wall: No tenderness.  Breasts:    Right: No mass or tenderness.     Left: No mass or tenderness.  Abdominal:     General: Bowel sounds are normal. There is no distension.     Palpations: Abdomen is soft. There is no mass.     Tenderness: There is no abdominal tenderness.  Genitourinary:    General: Normal vulva.     Vagina: No vaginal discharge.     Comments: Normal cervix, normal adnexa Musculoskeletal:        General: No tenderness. Normal range of motion.     Cervical back: Normal range of motion.  Skin:    General: Skin is warm and dry.  Neurological:     Mental Status: She is alert and oriented to person, place, and time.     Deep Tendon Reflexes: Reflexes are normal and symmetric.    CMP Latest Ref Rng & Units 08/16/2020 01/19/2020 04/21/2019  Glucose 65 - 99 mg/dL 76 97 81  BUN 6 - 24 mg/dL 11 8 12   Creatinine 0.57 - 1.00 mg/dL 0.84 0.87 0.90  Sodium 134 - 144 mmol/L 139 139 140  Potassium 3.5 - 5.2 mmol/L 4.1 4.1 4.2  Chloride 96 - 106 mmol/L 102 102 104  CO2 20 - 29 mmol/L 24 24 22   Calcium 8.7 - 10.2 mg/dL 9.4 9.4 9.4   Total Protein 6.0 - 8.5 g/dL - 7.2 7.0  Total Bilirubin 0.0 - 1.2 mg/dL - <0.2 <0.2  Alkaline Phos 39 - 117 IU/L - 96 81  AST 0 - 40 IU/L - 17 15  ALT 0 - 32 IU/L - 12 14    Lipid Panel     Component Value Date/Time  CHOL 218 (H) 01/19/2020 1413   TRIG 53 01/19/2020 1413   HDL 78 01/19/2020 1413   CHOLHDL 2.8 01/19/2020 1413   CHOLHDL 2.8 11/16/2016 0904   VLDL 16 11/16/2016 0904   LDLCALC 131 (H) 01/19/2020 1413    CBC    Component Value Date/Time   WBC 4.4 01/15/2016 0610   RBC 4.09 01/15/2016 0610   HGB 12.7 01/15/2016 0610   HCT 39.1 01/15/2016 0610   PLT 273 01/15/2016 0610   MCV 95.6 01/15/2016 0610   MCH 31.1 01/15/2016 0610   MCHC 32.5 01/15/2016 0610   RDW 13.5 01/15/2016 0610   LYMPHSABS 2.2 01/19/2011 1920   MONOABS 0.4 01/19/2011 1920   EOSABS 0.9 (H) 01/19/2011 1920   BASOSABS 0.0 01/19/2011 1920    Lab Results  Component Value Date   HGBA1C 5.4 06/17/2017    Assessment & Plan:  1. Annual physical exam Counseled on 150 minutes of exercise per week, healthy eating (including decreased daily intake of saturated fats, cholesterol, added sugars, sodium), routine healthcare maintenance. - CMP14+EGFR - Lipid panel  2. Need for hepatitis C screening test - HCV RNA quant rflx ultra or genotyp([facility])  3. Encounter for screening mammogram for malignant neoplasm of breast - MM 3D SCREEN BREAST BILATERAL; Future  4. Screening for cervical cancer - Cytology - PAP([place])  5. Need for Tdap vaccination Tdap administered  6. Screening for viral disease - HIV Antibody (routine testing w rflx)  Will refer back to GI due to GI note requesting EGD after her last colonoscopy report.  No orders of the defined types were placed in this encounter.   Follow-up: Return in about 6 months (around 11/14/2021) for chronic medical conditions.       Charlott Rakes, MD, FAAFP. [facility] and [place],  [place]   05/15/2021, 5:47 PM

## 2021-05-15 NOTE — Patient Instructions (Signed)
Health Maintenance, Female Adopting a healthy lifestyle and getting preventive care are important in promoting health and wellness. Ask your health care provider about: The right schedule for you to have regular tests and exams. Things you can do on your own to prevent diseases and keep yourself healthy. What should I know about diet, weight, and exercise? Eat a healthy diet  Eat a diet that includes plenty of vegetables, fruits, low-fat dairy products, and lean protein. Do not eat a lot of foods that are high in solid fats, added sugars, or sodium.  Maintain a healthy weight Body mass index (BMI) is used to identify weight problems. It estimates body fat based on height and weight. Your health care provider can help determineyour BMI and help you achieve or maintain a healthy weight. Get regular exercise Get regular exercise. This is one of the most important things you can do for your health. Most adults should: Exercise for at least 150 minutes each week. The exercise should increase your heart rate and make you sweat (moderate-intensity exercise). Do strengthening exercises at least twice a week. This is in addition to the moderate-intensity exercise. Spend less time sitting. Even light physical activity can be beneficial. Watch cholesterol and blood lipids Have your blood tested for lipids and cholesterol at 56 years of age, then havethis test every 5 years. Have your cholesterol levels checked more often if: Your lipid or cholesterol levels are high. You are older than 56 years of age. You are at high risk for heart disease. What should I know about cancer screening? Depending on your health history and family history, you may need to have cancer screening at various ages. This may include screening for: Breast cancer. Cervical cancer. Colorectal cancer. Skin cancer. Lung cancer. What should I know about heart disease, diabetes, and high blood pressure? Blood pressure and heart  disease High blood pressure causes heart disease and increases the risk of stroke. This is more likely to develop in people who have high blood pressure readings, are of African descent, or are overweight. Have your blood pressure checked: Every 3-5 years if you are 18-39 years of age. Every year if you are 40 years old or older. Diabetes Have regular diabetes screenings. This checks your fasting blood sugar level. Have the screening done: Once every three years after age 40 if you are at a normal weight and have a low risk for diabetes. More often and at a younger age if you are overweight or have a high risk for diabetes. What should I know about preventing infection? Hepatitis B If you have a higher risk for hepatitis B, you should be screened for this virus. Talk with your health care provider to find out if you are at risk forhepatitis B infection. Hepatitis C Testing is recommended for: Everyone born from 1945 through 1965. Anyone with known risk factors for hepatitis C. Sexually transmitted infections (STIs) Get screened for STIs, including gonorrhea and chlamydia, if: You are sexually active and are younger than 56 years of age. You are older than 56 years of age and your health care provider tells you that you are at risk for this type of infection. Your sexual activity has changed since you were last screened, and you are at increased risk for chlamydia or gonorrhea. Ask your health care provider if you are at risk. Ask your health care provider about whether you are at high risk for HIV. Your health care provider may recommend a prescription medicine to help   prevent HIV infection. If you choose to take medicine to prevent HIV, you should first get tested for HIV. You should then be tested every 3 months for as long as you are taking the medicine. Pregnancy If you are about to stop having your period (premenopausal) and you may become pregnant, seek counseling before you get  pregnant. Take 400 to 800 micrograms (mcg) of folic acid every day if you become pregnant. Ask for birth control (contraception) if you want to prevent pregnancy. Osteoporosis and menopause Osteoporosis is a disease in which the bones lose minerals and strength with aging. This can result in bone fractures. If you are 65 years old or older, or if you are at risk for osteoporosis and fractures, ask your health care provider if you should: Be screened for bone loss. Take a calcium or vitamin D supplement to lower your risk of fractures. Be given hormone replacement therapy (HRT) to treat symptoms of menopause. Follow these instructions at home: Lifestyle Do not use any products that contain nicotine or tobacco, such as cigarettes, e-cigarettes, and chewing tobacco. If you need help quitting, ask your health care provider. Do not use street drugs. Do not share needles. Ask your health care provider for help if you need support or information about quitting drugs. Alcohol use Do not drink alcohol if: Your health care provider tells you not to drink. You are pregnant, may be pregnant, or are planning to become pregnant. If you drink alcohol: Limit how much you use to 0-1 drink a day. Limit intake if you are breastfeeding. Be aware of how much alcohol is in your drink. In the [place] one drink equals one 12 oz bottle of beer (355 mL), one 5 oz glass of wine (148 mL), or one 1 oz glass of hard liquor (44 mL). General instructions Schedule regular health, dental, and eye exams. Stay current with your vaccines. Tell your health care provider if: You often feel depressed. You have ever been abused or do not feel safe at home. Summary Adopting a healthy lifestyle and getting preventive care are important in promoting health and wellness. Follow your health care provider's instructions about healthy diet, exercising, and getting tested or screened for diseases. Follow your health care provider's  instructions on monitoring your cholesterol and blood pressure. This information is not intended to replace advice given to you by your health care provider. Make sure you discuss any questions you have with your healthcare provider. Document Revised: 11/12/2018 Document Reviewed: 11/12/2018 Elsevier Patient Education  2022 [employer].  

## 2021-05-16 ENCOUNTER — Other Ambulatory Visit: Payer: Self-pay | Admitting: Family Medicine

## 2021-05-16 DIAGNOSIS — D126 Benign neoplasm of colon, unspecified: Secondary | ICD-10-CM

## 2021-05-16 LAB — LIPID PANEL
Chol/HDL Ratio: 2.8 ratio (ref 0.0–4.4)
Cholesterol, Total: 199 mg/dL (ref 100–199)
HDL: 70 mg/dL (ref >39)
LDL Chol Calc (NIH): 113 mg/dL — ABNORMAL HIGH (ref 0–99)
Triglycerides: 89 mg/dL (ref 0–149)
VLDL Cholesterol Cal: 16 mg/dL (ref 5–40)

## 2021-05-16 LAB — CMP14+EGFR
ALT: 16 IU/L (ref 0–32)
AST: 14 IU/L (ref 0–40)
Albumin/Globulin Ratio: 1.7 (ref 1.2–2.2)
Albumin: 4.7 g/dL (ref 3.8–4.9)
Alkaline Phosphatase: 96 IU/L (ref 44–121)
BUN/Creatinine Ratio: 14 (ref 9–23)
BUN: 10 mg/dL (ref 6–24)
Bilirubin Total: 0.2 mg/dL (ref 0.0–1.2)
CO2: 22 mmol/L (ref 20–29)
Calcium: 9 mg/dL (ref 8.7–10.2)
Chloride: 104 mmol/L (ref 96–106)
Creatinine, Ser: 0.73 mg/dL (ref 0.57–1.00)
Globulin, Total: 2.8 g/dL (ref 1.5–4.5)
Glucose: 71 mg/dL (ref 65–99)
Potassium: 4.1 mmol/L (ref 3.5–5.2)
Sodium: 140 mmol/L (ref 134–144)
Total Protein: 7.5 g/dL (ref 6.0–8.5)
eGFR: 96 mL/min/{1.73_m2} (ref >59)

## 2021-05-16 LAB — HCV RNA QUANT RFLX ULTRA OR GENOTYP: HCV Quant Baseline: NOT DETECTED IU/mL

## 2021-05-16 LAB — HIV ANTIBODY (ROUTINE TESTING W REFLEX): HIV Screen 4th Generation wRfx: NONREACTIVE

## 2021-05-16 NOTE — Telephone Encounter (Signed)
Patient scheduled.

## 2021-05-17 ENCOUNTER — Other Ambulatory Visit: Payer: Self-pay

## 2021-05-17 ENCOUNTER — Ambulatory Visit: Payer: 59 | Attending: Family Medicine

## 2021-05-17 DIAGNOSIS — Z23 Encounter for immunization: Secondary | ICD-10-CM | POA: Diagnosis not present

## 2021-05-18 LAB — CYTOLOGY - PAP
Comment: NEGATIVE
Diagnosis: UNDETERMINED — AB
High risk HPV: NEGATIVE

## 2021-05-30 ENCOUNTER — Telehealth: Payer: Self-pay

## 2021-05-30 NOTE — Telephone Encounter (Signed)
-----   Message from Charlott Rakes, MD sent at 05/19/2021  5:39 PM EDT ----- Please inform her that her PAP smear shows atypical cells that are not cancerous and she is negative for the HPV virus which causes cancer. Because she has atypical cells I would like to repeat her PAP smear in 1 year. Thanks

## 2021-05-30 NOTE — Telephone Encounter (Signed)
Pt was called and a VM was left informing patient of pap smear results.

## 2021-06-12 ENCOUNTER — Other Ambulatory Visit: Payer: Self-pay

## 2021-06-19 ENCOUNTER — Other Ambulatory Visit: Payer: Self-pay

## 2021-06-28 ENCOUNTER — Other Ambulatory Visit: Payer: Self-pay

## 2021-06-30 ENCOUNTER — Other Ambulatory Visit: Payer: Self-pay

## 2021-07-10 ENCOUNTER — Other Ambulatory Visit: Payer: Self-pay

## 2021-07-11 ENCOUNTER — Other Ambulatory Visit: Payer: Self-pay

## 2021-07-18 ENCOUNTER — Other Ambulatory Visit: Payer: Self-pay

## 2021-07-19 ENCOUNTER — Other Ambulatory Visit: Payer: Self-pay

## 2021-07-26 ENCOUNTER — Other Ambulatory Visit: Payer: Self-pay

## 2021-07-28 ENCOUNTER — Other Ambulatory Visit: Payer: Self-pay

## 2021-08-09 ENCOUNTER — Other Ambulatory Visit: Payer: Self-pay

## 2021-08-11 ENCOUNTER — Other Ambulatory Visit: Payer: Self-pay

## 2021-08-17 ENCOUNTER — Other Ambulatory Visit: Payer: Self-pay

## 2021-08-18 ENCOUNTER — Other Ambulatory Visit: Payer: Self-pay

## 2021-08-25 ENCOUNTER — Other Ambulatory Visit: Payer: Self-pay

## 2021-09-07 ENCOUNTER — Other Ambulatory Visit: Payer: Self-pay

## 2021-09-08 ENCOUNTER — Other Ambulatory Visit: Payer: Self-pay

## 2021-09-16 ENCOUNTER — Other Ambulatory Visit: Payer: Self-pay

## 2021-09-18 ENCOUNTER — Other Ambulatory Visit: Payer: Self-pay

## 2021-09-28 ENCOUNTER — Other Ambulatory Visit: Payer: Self-pay

## 2021-10-09 ENCOUNTER — Other Ambulatory Visit: Payer: Self-pay | Admitting: Family Medicine

## 2021-10-09 DIAGNOSIS — I10 Essential (primary) hypertension: Secondary | ICD-10-CM

## 2021-10-09 MED ORDER — AMLODIPINE BESYLATE 10 MG PO TABS
ORAL_TABLET | Freq: Every day | ORAL | 0 refills | Status: DC
  Filled 2021-10-09: qty 30, 30d supply, fill #0
  Filled 2021-11-08: qty 30, 30d supply, fill #1

## 2021-10-09 NOTE — Telephone Encounter (Signed)
Requested Prescriptions  Pending Prescriptions Disp Refills  . amLODipine (NORVASC) 10 MG tablet 90 tablet 0    Sig: TAKE 1 TABLET (10 MG TOTAL) BY MOUTH DAILY.     Cardiovascular:  Calcium Channel Blockers Passed - 10/09/2021  3:57 PM      Passed - Last BP in normal range    BP Readings from Last 1 Encounters:  05/15/21 130/79         Passed - Valid encounter within last 6 months    Recent Outpatient Visits          4 months ago Annual physical exam   [place], Charlane Ferretti, MD   6 months ago COPD with chronic bronchitis [facility])   [place], Enobong, MD   1 year ago Vitamin D deficiency   [place], Enobong, MD   1 year ago Vitamin D deficiency   [place], Charlane Ferretti, MD   2 years ago Essential hypertension   [place], Enobong, MD      Future Appointments            In 1 month Charlott Rakes, MD [place]

## 2021-10-10 ENCOUNTER — Other Ambulatory Visit: Payer: Self-pay

## 2021-10-18 ENCOUNTER — Other Ambulatory Visit: Payer: Self-pay | Admitting: Family Medicine

## 2021-10-18 DIAGNOSIS — Z780 Asymptomatic menopausal state: Secondary | ICD-10-CM

## 2021-10-18 MED ORDER — CLONIDINE HCL 0.2 MG PO TABS
ORAL_TABLET | ORAL | 0 refills | Status: DC
  Filled 2021-10-18: qty 30, 30d supply, fill #0

## 2021-10-18 NOTE — Telephone Encounter (Signed)
Requested Prescriptions  Pending Prescriptions Disp Refills  . cloNIDine (CATAPRES) 0.2 MG tablet 90 tablet 0    Sig: TAKE 1 TABLET (0.2 MG TOTAL) BY MOUTH AT BEDTIME FOR HOT FLASHES     Cardiovascular:  Alpha-2 Agonists Passed - 10/18/2021  8:12 PM      Passed - Last BP in normal range    BP Readings from Last 1 Encounters:  05/15/21 130/79         Passed - Last Heart Rate in normal range    Pulse Readings from Last 1 Encounters:  05/15/21 75         Passed - Valid encounter within last 6 months    Recent Outpatient Visits          5 months ago Annual physical exam   [place], Charlane Ferretti, MD   6 months ago COPD with chronic bronchitis ([place])   [facility] Charlott Rakes, MD   1 year ago Vitamin D deficiency   [place], Enobong, MD   1 year ago Vitamin D deficiency   [place], Charlane Ferretti, MD   2 years ago Essential hypertension   [place], Enobong, MD      Future Appointments            In 1 month Charlott Rakes, MD [place]

## 2021-10-19 ENCOUNTER — Other Ambulatory Visit: Payer: Self-pay

## 2021-10-20 ENCOUNTER — Other Ambulatory Visit: Payer: Self-pay

## 2021-10-23 ENCOUNTER — Other Ambulatory Visit: Payer: Self-pay

## 2021-10-24 ENCOUNTER — Other Ambulatory Visit: Payer: Self-pay

## 2021-10-31 ENCOUNTER — Other Ambulatory Visit: Payer: Self-pay

## 2021-11-09 ENCOUNTER — Other Ambulatory Visit: Payer: Self-pay

## 2021-11-11 ENCOUNTER — Other Ambulatory Visit: Payer: Self-pay

## 2021-11-11 ENCOUNTER — Ambulatory Visit
Admission: EM | Admit: 2021-11-11 | Discharge: 2021-11-11 | Disposition: A | Discharge status: Home / Self Care | Payer: 59 | Attending: Physician Assistant | Admitting: Physician Assistant

## 2021-11-11 ENCOUNTER — Encounter: Payer: Self-pay | Admitting: *Deleted

## 2021-11-11 DIAGNOSIS — J019 Acute sinusitis, unspecified: Secondary | ICD-10-CM

## 2021-11-11 DIAGNOSIS — H65191 Other acute nonsuppurative otitis media, right ear: Secondary | ICD-10-CM

## 2021-11-11 MED ORDER — AMOXICILLIN-POT CLAVULANATE 875-125 MG PO TABS: 1.0000 | ORAL_TABLET | Freq: Two times a day (BID) | ORAL | 0 refills | Status: DC

## 2021-11-11 MED ORDER — FLUTICASONE PROPIONATE 50 MCG/ACT NA SUSP: 1.0000 | Freq: Every day | NASAL | 0 refills | Status: DC

## 2021-11-11 NOTE — ED Triage Notes (Signed)
C/O bilat plugged ears along with HA and facial/nasal pressure and congestion. Has been using H2O2 in bilat ears.

## 2021-11-12 ENCOUNTER — Encounter: Payer: Self-pay | Admitting: Physician Assistant

## 2021-11-12 NOTE — ED Provider Notes (Addendum)
[facility]    CSN: [id number] Arrival date & time: 11/11/21  1341      History   Chief Complaint Chief Complaint  Patient presents with   Ear Fullness   Facial Pain    HPI Tanya Jordan is a 55 y.o. female.   Here today for evaluation of bilateral ear fullness, decreased hearing, and facial pain and pressure that started about 3 days ago.  She reports that she has hearing loss at baseline but this seems to be worse.  She has off-and-on congestion as well as COPD, but states her cough is not necessarily any worse than typical.  She has not had fever.  She tried over-the-counter medication without significant relief  The history is provided by the patient.  Ear Fullness Pertinent negatives include no shortness of breath.   Past Medical History:  Diagnosis Date   Allergy    COPD (chronic obstructive pulmonary disease) ([place])    Family history of breast cancer    Hypertension    Personal history of colonic polyps     Patient Active Problem List   Diagnosis Date Noted   Vitamin D deficiency 04/21/2018   Genetic testing 11/29/2017   Personal history of colonic polyps    Family history of breast cancer    Seasonal allergies 06/17/2017   Essential hypertension 11/09/2016   COPD with chronic bronchitis ([place]) 03/22/2016   Tobacco abuse 01/15/2016    Past Surgical History:  Procedure Laterality Date   BREAST BIOPSY Left    REVISION OF SCAR ON FACE/HEAD      OB History     Gravida  4   Para      Term      Preterm      AB      Living  4      SAB      IAB      Ectopic      Multiple      Live Births  4            Home Medications    Prior to Admission medications   Medication Sig Start Date End Date Taking? Authorizing Provider  albuterol (VENTOLIN HFA) 108 (90 Base) MCG/ACT inhaler INHALE 2 PUFFS INTO THE LUNGS EVERY 4 (FOUR) HOURS AS NEEDED FOR WHEEZING OR SHORTNESS OF BREATH. 03/28/21 03/28/22 Yes [place], Enobong, MD   amLODipine (NORVASC) 10 MG tablet TAKE 1 TABLET (10 MG TOTAL) BY MOUTH DAILY. 10/09/21 10/09/22 Yes Charlott Rakes, MD  amoxicillin-clavulanate (AUGMENTIN) 875-125 MG tablet Take 1 tablet by mouth every 12 (twelve) hours. 11/11/21  Yes Francene Finders, PA-C  budesonide-formoterol [facility]) 160-4.5 MCG/ACT inhaler Inhale 2 puffs into the lungs 2 (two) times daily. 03/28/21  Yes [place], Charlane Ferretti, MD  cloNIDine (CATAPRES) 0.2 MG tablet TAKE 1 TABLET (0.2 MG TOTAL) BY MOUTH AT BEDTIME FOR HOT FLASHES 10/18/21 10/18/22 Yes [place], Enobong, MD  fluticasone (FLONASE) 50 MCG/ACT nasal spray Place 1 spray into both nostrils daily for 7 days. 11/11/21 11/18/21 Yes Francene Finders, PA-C  albuterol (PROVENTIL) (2.5 MG/3ML) 0.083% nebulizer solution Take 3 mLs (2.5 mg total) by nebulization every 6 (six) hours as needed for wheezing or shortness of breath. Patient not taking: No sig reported 04/21/19   Charlott Rakes, MD  cetirizine (ZYRTEC) 10 MG tablet Take 1 tablet (10 mg total) by mouth daily. 01/12/20   Charlott Rakes, MD  ergocalciferol (DRISDOL) 1.25 MG (50000 UT) capsule Take 1 capsule (50,000 Units total) by mouth  once a week. 08/17/20   Charlott Rakes, MD  montelukast (SINGULAIR) 10 MG tablet TAKE 1 TABLET (10 MG TOTAL) BY MOUTH AT BEDTIME. 03/28/21 03/28/22  Charlott Rakes, MD  mupirocin cream (BACTROBAN) 2 % Apply 1 application topically 2 (two) times daily. Apply to right ear 07/11/17   Charlott Rakes, MD  nicotine (NICODERM CQ) 21 mg/24hr patch Place 1 patch (21 mg total) onto the skin daily. 08/16/20   Charlott Rakes, MD  umeclidinium bromide (INCRUSE ELLIPTA) 62.5 MCG/INH AEPB Inhale 1 puff into the lungs daily. 08/16/20   Charlott Rakes, MD    Family History Family History  Problem Relation Age of Onset   Lung cancer Father    Hypertension Mother    Breast cancer Maternal Grandmother 96   Dementia Maternal Grandmother    Diabetes Maternal Grandfather    Colon cancer Neg Hx    Stomach  cancer Neg Hx     Social History Social History   Tobacco Use   Smoking status: Every Day    Packs/day: 0.25    Years: 30.00    Pack years: 7.50    Types: Cigarettes   Smokeless tobacco: Never   Tobacco comments:    3-5 cigs daily  Vaping Use   Vaping Use: Never used  Substance Use Topics   Alcohol use: Not Currently    Alcohol/week: 12.0 standard drinks    Types: 12 Cans of beer per week   Drug use: No     Allergies   Lisinopril   Review of Systems Review of Systems  Constitutional:  Negative for chills and fever.  HENT:  Positive for congestion and sinus pressure. Negative for ear pain.   Eyes:  Negative for discharge and redness.  Respiratory:  Positive for cough. Negative for shortness of breath.     Physical Exam Triage Vital Signs ED Triage Vitals  Enc Vitals Group     BP 11/11/21 1509 (!) 155/88     Pulse Rate 11/11/21 1509 89     Resp 11/11/21 1509 18     Temp 11/11/21 1509 98.6 F (37 C)     Temp Source 11/11/21 1509 Temporal     SpO2 11/11/21 1509 97 %     Weight --      Height --      Head Circumference --      Peak Flow --      Pain Score 11/11/21 1511 10     Pain Loc --      Pain Edu? --      Excl. in [place]? --    No data found.  Updated Vital Signs BP (!) 155/88   Pulse 89   Temp 98.6 F (37 C) (Temporal)   Resp 18   LMP 03/29/2011   SpO2 97%      Physical Exam Vitals and nursing note reviewed.  Constitutional:      General: She is not in acute distress.    Appearance: Normal appearance. She is not ill-appearing.  HENT:     Head: Normocephalic and atraumatic.     Right Ear: Tympanic membrane normal.     Left Ear: Tympanic membrane normal.     Nose: Congestion present.  Eyes:     Conjunctiva/sclera: Conjunctivae normal.  Cardiovascular:     Rate and Rhythm: Normal rate and regular rhythm.     Heart sounds: Normal heart sounds. No murmur heard. Pulmonary:     Effort: Pulmonary effort is normal. No respiratory distress.  Breath sounds: Normal breath sounds. No wheezing, rhonchi or rales.  Neurological:     Mental Status: She is alert.  Psychiatric:        Mood and Affect: Mood normal.        Behavior: Behavior normal.     UC Treatments / Results  Labs (all labs ordered are listed, but only abnormal results are displayed) Labs Reviewed - No data to display  EKG   Radiology No results found.  Procedures Procedures (including critical care time)  Medications Ordered in UC Medications - No data to display  Initial Impression / Assessment and Plan / UC Course  I have reviewed the triage vital signs and the nursing notes.  Pertinent labs & imaging results that were available during my care of the patient were reviewed by me and considered in my medical decision making (see chart for details).   Augmentin and Flonase prescribed. Recommend follow up if symptoms fail to improve or worsen in any way.   Final Clinical Impressions(s) / UC Diagnoses   Final diagnoses:  Acute sinusitis, recurrence not specified, unspecified location  Other acute nonsuppurative otitis media of right ear, recurrence not specified   Discharge Instructions   None    ED Prescriptions     Medication Sig Dispense Auth. Provider   amoxicillin-clavulanate (AUGMENTIN) 875-125 MG tablet Take 1 tablet by mouth every 12 (twelve) hours. 14 tablet Ewell Poe F, PA-C   fluticasone [facility]) 50 MCG/ACT nasal spray Place 1 spray into both nostrils daily for 7 days. 16 g Francene Finders, PA-C      PDMP not reviewed this encounter.   Francene Finders, PA-C 11/12/21 0827    Francene Finders, PA-C 11/12/21 [phone]

## 2021-11-13 ENCOUNTER — Ambulatory Visit: Payer: Self-pay | Admitting: *Deleted

## 2021-11-13 ENCOUNTER — Emergency Department (HOSPITAL_BASED_OUTPATIENT_CLINIC_OR_DEPARTMENT_OTHER): Payer: 59

## 2021-11-13 ENCOUNTER — Emergency Department (HOSPITAL_BASED_OUTPATIENT_CLINIC_OR_DEPARTMENT_OTHER): Payer: 59 | Admitting: Radiology

## 2021-11-13 ENCOUNTER — Encounter (HOSPITAL_BASED_OUTPATIENT_CLINIC_OR_DEPARTMENT_OTHER): Payer: Self-pay | Admitting: Emergency Medicine

## 2021-11-13 ENCOUNTER — Inpatient Hospital Stay (HOSPITAL_BASED_OUTPATIENT_CLINIC_OR_DEPARTMENT_OTHER)
Admission: EM | Admit: 2021-11-13 | Discharge: 2021-11-22 | DRG: 025 | Disposition: A | Discharge status: Home Health | Payer: 59 | Attending: Internal Medicine | Admitting: Internal Medicine

## 2021-11-13 ENCOUNTER — Other Ambulatory Visit: Payer: Self-pay

## 2021-11-13 DIAGNOSIS — I10 Essential (primary) hypertension: Secondary | ICD-10-CM | POA: Diagnosis present

## 2021-11-13 DIAGNOSIS — Z801 Family history of malignant neoplasm of trachea, bronchus and lung: Secondary | ICD-10-CM | POA: Diagnosis not present

## 2021-11-13 DIAGNOSIS — F17210 Nicotine dependence, cigarettes, uncomplicated: Secondary | ICD-10-CM | POA: Diagnosis present

## 2021-11-13 DIAGNOSIS — E222 Syndrome of inappropriate secretion of antidiuretic hormone: Secondary | ICD-10-CM | POA: Diagnosis present

## 2021-11-13 DIAGNOSIS — Z833 Family history of diabetes mellitus: Secondary | ICD-10-CM | POA: Diagnosis not present

## 2021-11-13 DIAGNOSIS — J189 Pneumonia, unspecified organism: Secondary | ICD-10-CM | POA: Diagnosis present

## 2021-11-13 DIAGNOSIS — Z20822 Contact with and (suspected) exposure to covid-19: Secondary | ICD-10-CM | POA: Diagnosis present

## 2021-11-13 DIAGNOSIS — E861 Hypovolemia: Secondary | ICD-10-CM | POA: Diagnosis present

## 2021-11-13 DIAGNOSIS — C7931 Secondary malignant neoplasm of brain: Secondary | ICD-10-CM | POA: Diagnosis present

## 2021-11-13 DIAGNOSIS — Z79899 Other long term (current) drug therapy: Secondary | ICD-10-CM

## 2021-11-13 DIAGNOSIS — Z86010 Personal history of colonic polyps: Secondary | ICD-10-CM

## 2021-11-13 DIAGNOSIS — A419 Sepsis, unspecified organism: Secondary | ICD-10-CM

## 2021-11-13 DIAGNOSIS — I629 Nontraumatic intracranial hemorrhage, unspecified: Secondary | ICD-10-CM | POA: Diagnosis not present

## 2021-11-13 DIAGNOSIS — Z803 Family history of malignant neoplasm of breast: Secondary | ICD-10-CM

## 2021-11-13 DIAGNOSIS — I4891 Unspecified atrial fibrillation: Secondary | ICD-10-CM | POA: Diagnosis present

## 2021-11-13 DIAGNOSIS — C3411 Malignant neoplasm of upper lobe, right bronchus or lung: Secondary | ICD-10-CM | POA: Diagnosis present

## 2021-11-13 DIAGNOSIS — J9601 Acute respiratory failure with hypoxia: Secondary | ICD-10-CM | POA: Diagnosis present

## 2021-11-13 DIAGNOSIS — Z7951 Long term (current) use of inhaled steroids: Secondary | ICD-10-CM | POA: Diagnosis not present

## 2021-11-13 DIAGNOSIS — R911 Solitary pulmonary nodule: Secondary | ICD-10-CM

## 2021-11-13 DIAGNOSIS — I614 Nontraumatic intracerebral hemorrhage in cerebellum: Secondary | ICD-10-CM | POA: Diagnosis present

## 2021-11-13 DIAGNOSIS — Z8249 Family history of ischemic heart disease and other diseases of the circulatory system: Secondary | ICD-10-CM | POA: Diagnosis not present

## 2021-11-13 DIAGNOSIS — I951 Orthostatic hypotension: Secondary | ICD-10-CM | POA: Diagnosis present

## 2021-11-13 LAB — COMPREHENSIVE METABOLIC PANEL
ALT: 15 U/L (ref 0–44)
AST: 17 U/L (ref 15–41)
Albumin: 4.5 g/dL (ref 3.5–5.0)
Alkaline Phosphatase: 92 U/L (ref 38–126)
Anion gap: 10 (ref 5–15)
BUN: 11 mg/dL (ref 6–20)
CO2: 29 mmol/L (ref 22–32)
Calcium: 10 mg/dL (ref 8.9–10.3)
Chloride: 93 mmol/L — ABNORMAL LOW (ref 98–111)
Creatinine, Ser: 0.63 mg/dL (ref 0.44–1.00)
GFR, Estimated: >60 mL/min (ref >60)
Glucose, Bld: 117 mg/dL — ABNORMAL HIGH (ref 70–99)
Potassium: 4.8 mmol/L (ref 3.5–5.1)
Sodium: 132 mmol/L — ABNORMAL LOW (ref 135–145)
Total Bilirubin: 0.5 mg/dL (ref 0.3–1.2)
Total Protein: 7.7 g/dL (ref 6.5–8.1)

## 2021-11-13 LAB — CBC WITH DIFFERENTIAL/PLATELET
Abs Immature Granulocytes: 0.03 10*3/uL (ref 0.00–0.07)
Basophils Absolute: 0 10*3/uL (ref 0.0–0.1)
Basophils Relative: 0 %
Eosinophils Absolute: 0 10*3/uL (ref 0.0–0.5)
Eosinophils Relative: 0 %
HCT: 37 % (ref 36.0–46.0)
Hemoglobin: 12.1 g/dL (ref 12.0–15.0)
Immature Granulocytes: 0 %
Lymphocytes Relative: 10 %
Lymphs Abs: 0.9 10*3/uL (ref 0.7–4.0)
MCH: 28.8 pg (ref 26.0–34.0)
MCHC: 32.7 g/dL (ref 30.0–36.0)
MCV: 88.1 fL (ref 80.0–100.0)
Monocytes Absolute: 0.5 10*3/uL (ref 0.1–1.0)
Monocytes Relative: 5 %
Neutro Abs: 7.5 10*3/uL (ref 1.7–7.7)
Neutrophils Relative %: 85 %
Platelets: 301 10*3/uL (ref 150–400)
RBC: 4.2 MIL/uL (ref 3.87–5.11)
RDW: 13.4 % (ref 11.5–15.5)
WBC: 8.9 10*3/uL (ref 4.0–10.5)
nRBC: 0 % (ref 0.0–0.2)

## 2021-11-13 LAB — RESP PANEL BY RT-PCR (FLU A&B, COVID) ARPGX2
Influenza A by PCR: NEGATIVE
Influenza B by PCR: NEGATIVE
SARS Coronavirus 2 by RT PCR: NEGATIVE

## 2021-11-13 LAB — LIPASE, BLOOD: Lipase: 17 U/L (ref 11–51)

## 2021-11-13 LAB — TROPONIN I (HIGH SENSITIVITY)
Troponin I (High Sensitivity): 22 ng/L — ABNORMAL HIGH (ref <18)
Troponin I (High Sensitivity): 22 ng/L — ABNORMAL HIGH (ref <18)

## 2021-11-13 MED ORDER — IOHEXOL 300 MG/ML  SOLN
60.0000 mL | Freq: Once | INTRAMUSCULAR | Status: AC | PRN
  Administered 2021-11-13: 60 mL via INTRAVENOUS

## 2021-11-13 MED ORDER — MORPHINE SULFATE (PF) 4 MG/ML IV SOLN
4.0000 mg | Freq: Once | INTRAVENOUS | Status: AC
  Administered 2021-11-13: 4 mg via INTRAVENOUS
  Filled 2021-11-13: qty 1

## 2021-11-13 MED ORDER — DIPHENHYDRAMINE HCL 50 MG/ML IJ SOLN
12.5000 mg | Freq: Once | INTRAMUSCULAR | Status: AC
  Administered 2021-11-13: 12.5 mg via INTRAVENOUS
  Filled 2021-11-13: qty 1

## 2021-11-13 MED ORDER — NICARDIPINE HCL IN NACL 20-0.86 MG/200ML-% IV SOLN
3.0000 mg/h | INTRAVENOUS | Status: DC
  Administered 2021-11-14: 5 mg/h via INTRAVENOUS
  Administered 2021-11-14: 4 mg/h via INTRAVENOUS
  Administered 2021-11-14 (×2): 5 mg/h via INTRAVENOUS
  Filled 2021-11-13 (×3): qty 200

## 2021-11-13 MED ORDER — NICARDIPINE HCL IN NACL 20-0.86 MG/200ML-% IV SOLN
3.0000 mg/h | INTRAVENOUS | Status: DC
  Filled 2021-11-13: qty 200

## 2021-11-13 MED ORDER — NICARDIPINE HCL IN NACL 20-0.86 MG/200ML-% IV SOLN
0.0000 mg/h | INTRAVENOUS | Status: DC
Start: 2021-11-13 — End: 2021-11-13

## 2021-11-13 MED ORDER — ONDANSETRON HCL 4 MG/2ML IJ SOLN
4.0000 mg | Freq: Once | INTRAMUSCULAR | Status: AC
  Administered 2021-11-13: 4 mg via INTRAVENOUS
  Filled 2021-11-13: qty 2

## 2021-11-13 MED ORDER — CHLORHEXIDINE GLUCONATE CLOTH 2 % EX PADS
6.0000 | MEDICATED_PAD | Freq: Every day | CUTANEOUS | Status: DC
  Administered 2021-11-15 – 2021-11-20 (×5): 6 via TOPICAL

## 2021-11-13 NOTE — ED Notes (Addendum)
Pt now NPO, v.o. Erskine Speed PA.

## 2021-11-13 NOTE — ED Notes (Signed)
Patient transported to CT 

## 2021-11-13 NOTE — Telephone Encounter (Signed)
Reason for Disposition  Ear congestion present > 48 hours  Coughing up rusty-colored sputum  Answer Assessment - Initial Assessment Questions 1. LOCATION: "Which ear is involved?"       Both  2. SENSATION: "Describe how the ear feels." (e.g. stuffy, full, plugged)."      Clogged 3. ONSET:  "When did the ear symptoms start?"       Last Tuesday  4. PAIN: "Do you also have an earache?" If Yes, ask: "How bad is it?" (Scale 1-10; or mild, moderate, severe)     Na  5. CAUSE: "What do you think is causing the ear congestion?"     Sinus infection 6. URI: "Do you have a runny nose or cough?"      Yes both  7. NASAL ALLERGIES: "Are there symptoms of hay fever, such as sneezing or a clear nasal discharge?"     Runny nose and cough up bloody sputum. Red streaks noted  8. PREGNANCY: "Is there any chance you are pregnant?" "When was your last menstrual period?"     na  Answer Assessment - Initial Assessment Questions 1. ONSET: "When did the cough begin?"      Na  2. SEVERITY: "How bad is the cough today?" "Did the blood appear after a coughing spell?"      Sinus infection dx. Coughing up blood streaks in sputum and reports chunks of blood at times  3. SPUTUM: "Describe the color of your sputum" (none, dry cough; clear, white, yellow, green)     na 4. HEMOPTYSIS: "How much blood?" (flecks, streaks, tablespoons, etc.)     Streaks but noticed chunks of blood at times  5. DIFFICULTY BREATHING: "Are you having difficulty breathing?" If Yes, ask: "How bad is it?" (e.g., mild, moderate, severe)    - MILD: No SOB at rest, mild SOB with walking, speaks normally in sentences, can lie down, no retractions, pulse < 100.    - MODERATE: SOB at rest, SOB with minimal exertion and prefers to sit, cannot lie down flat, speaks in phrases, mild retractions, audible wheezing, pulse 100-120.    - SEVERE: Very SOB at rest, speaks in single words, struggling to breathe, sitting hunched forward, retractions, pulse > 120       na 6. FEVER: "Do you have a fever?" If Yes, ask: "What is your temperature, how was it measured, and when did it start?"     na 7. CARDIAC HISTORY: "Do you have any history of heart disease?" (e.g., heart attack, congestive heart failure)      na 8. LUNG HISTORY: "Do you have any history of lung disease?"  (e.g., pulmonary embolus, asthma, emphysema)     na 9. PE RISK FACTORS: "Do you have a history of blood clots?" (or: recent major surgery, recent prolonged travel, bedridden)     na 10. OTHER SYMPTOMS: "Do you have any other symptoms?" (e.g., runny nose, wheezing, chest pain)       Runny nose , clogged ears  11. PREGNANCY: "Is there any chance you are pregnant?" "When was your last menstrual period?"       na 12. TRAVEL: "Have you traveled out of the country in the last month?" (e.g., travel history, exposures)       na  Protocols used: Ear - Congestion-A-AH, Coughing Up Blood-A-AH

## 2021-11-13 NOTE — ED Triage Notes (Addendum)
Seen 2 days ago and was told she had sinus infection ,  vomiting started sat night after visiting the [facility], was given meds for infection, per family pt has been very dizzy and occ unable to  walk very far due to that

## 2021-11-13 NOTE — H&P (Signed)
Neurosurgery H&P  CC: ICH  HPI: This is a 55 y.o. woman that presents with severe headache and emesis. Headache has been for a few days, severe in intensity, global with a focal occipital component. No new subjective weakness, numbness, or parasthesias, no recent change in bowel or bladder function, no new difficulty with balance or fine motor function in the hands. ROS + for acute on chronic shortness of breath, is on home O2. No recent use of anti-platelet or anti-coagulant medications. 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 Currently, has a headache that's moderate to severe, no nausea.    ROS: A 14 point ROS was performed and is negative except as noted in the HPI.   PMHx:  Past Medical History:  Diagnosis Date   Allergy    COPD (chronic obstructive pulmonary disease) ([place])    Family history of breast cancer    Hypertension    Personal history of colonic polyps    FamHx:  Family History  Problem Relation Age of Onset   Lung cancer Father    Hypertension Mother    Breast cancer Maternal Grandmother 96   Dementia Maternal Grandmother    Diabetes Maternal Grandfather    Colon cancer Neg Hx    Stomach cancer Neg Hx    SocHx:  reports that she has been smoking cigarettes. She has a 7.50 pack-year smoking history. She has never used smokeless tobacco. She reports that she does not currently use alcohol after a past usage of about 12.0 standard drinks per week. She reports that she does not use drugs.  Exam: Vital signs in last 24 hours: Temp:  [98.3 F (36.8 C)-98.9 F (37.2 C)] 98.9 F (37.2 C) (12/12 2125) Pulse Rate:  [52-78] 57 (12/12 2245) Resp:  [14-24] 15 (12/12 2245) BP: (150-171)/(71-94) 164/82 (12/12 2245) SpO2:  [96 %-100 %] 97 % (12/12 2245) Weight:  [54.1 kg] 54.1 kg (12/12 1142) General: Awake, alert, cooperative, lying in bed in NAD Head: Normocephalic and atruamatic HEENT: Neck supple Pulmonary: breathing room air  comfortably, no evidence of increased work of breathing Cardiac: RRR Abdomen: S NT ND Extremities: Warm and well perfused x4 Neuro: AOx3, PERRL, EOMI, FS & SS Strength 5/5 x4, SILTx4, some mild appendicular ataxia on the right, none on the left   Assessment and Plan: 55 y.o. woman w/ acute onset severe headache. 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

## 2021-11-13 NOTE — ED Provider Notes (Signed)
[facility] EMERGENCY DEPT Provider Note   CSN: [id number] Arrival date & time: 11/13/21  1135     History Chief Complaint  Patient presents with   Emesis    Tanya Jordan is a 55 y.o. female.  With past history of COPD not on home O2, hypertension who presents to the emergency department with emesis.  Patient states that beginning Friday night she started having a migraine.  She states that she was having pain behind her eyes and parietal region, similar to previous migraines.  She states that she also started having congestion and cough that is increased from her usual COPD.  She also endorses feeling increased shortness of breath from her baseline.  Endorses tactile fever and low back pain at home since Saturday.  She states that by Saturday afternoon she began having nausea and vomiting after eating.  She states that she has not eaten since Saturday.  She is tolerating p.o. fluids at home.  She denies abdominal pain or diarrhea.  She denies fevers, chest pain, palpitations, lower extremity edema, syncope.    Emesis Associated symptoms: cough, fever and headaches   Associated symptoms: no abdominal pain and no diarrhea       Past Medical History:  Diagnosis Date   Allergy    COPD (chronic obstructive pulmonary disease) ([place])    Family history of breast cancer    Hypertension    Personal history of colonic polyps     Patient Active Problem List   Diagnosis Date Noted   Vitamin D deficiency 04/21/2018   Genetic testing 11/29/2017   Personal history of colonic polyps    Family history of breast cancer    Seasonal allergies 06/17/2017   Essential hypertension 11/09/2016   COPD with chronic bronchitis ([place]) 03/22/2016   Tobacco abuse 01/15/2016    Past Surgical History:  Procedure Laterality Date   BREAST BIOPSY Left    REVISION OF SCAR ON FACE/HEAD       OB History     Gravida  4   Para      Term      Preterm      AB      Living  4       SAB      IAB      Ectopic      Multiple      Live Births  4           Family History  Problem Relation Age of Onset   Lung cancer Father    Hypertension Mother    Breast cancer Maternal Grandmother 96   Dementia Maternal Grandmother    Diabetes Maternal Grandfather    Colon cancer Neg Hx    Stomach cancer Neg Hx     Social History   Tobacco Use   Smoking status: Every Day    Packs/day: 0.25    Years: 30.00    Pack years: 7.50    Types: Cigarettes   Smokeless tobacco: Never   Tobacco comments:    3-5 cigs daily  Vaping Use   Vaping Use: Never used  Substance Use Topics   Alcohol use: Not Currently    Alcohol/week: 12.0 standard drinks    Types: 12 Cans of beer per week   Drug use: No    Home Medications Prior to Admission medications   Medication Sig Start Date End Date Taking? Authorizing Provider  albuterol (PROVENTIL) (2.5 MG/3ML) 0.083% nebulizer solution Take 3 mLs (2.5 mg total)  by nebulization every 6 (six) hours as needed for wheezing or shortness of breath. Patient not taking: No sig reported 04/21/19   Charlott Rakes, MD  albuterol (VENTOLIN HFA) 108 (90 Base) MCG/ACT inhaler INHALE 2 PUFFS INTO THE LUNGS EVERY 4 (FOUR) HOURS AS NEEDED FOR WHEEZING OR SHORTNESS OF BREATH. 03/28/21 03/28/22  Charlott Rakes, MD  amLODipine (NORVASC) 10 MG tablet TAKE 1 TABLET (10 MG TOTAL) BY MOUTH DAILY. 10/09/21 10/09/22  Charlott Rakes, MD  amoxicillin-clavulanate (AUGMENTIN) 875-125 MG tablet Take 1 tablet by mouth every 12 (twelve) hours. 11/11/21   Francene Finders, PA-C  budesonide-formoterol [facility]) 160-4.5 MCG/ACT inhaler Inhale 2 puffs into the lungs 2 (two) times daily. 03/28/21   Charlott Rakes, MD  cetirizine (ZYRTEC) 10 MG tablet Take 1 tablet (10 mg total) by mouth daily. 01/12/20   Charlott Rakes, MD  cloNIDine (CATAPRES) 0.2 MG tablet TAKE 1 TABLET (0.2 MG TOTAL) BY MOUTH AT BEDTIME FOR HOT FLASHES 10/18/21 10/18/22  Charlott Rakes, MD   ergocalciferol (DRISDOL) 1.25 MG (50000 UT) capsule Take 1 capsule (50,000 Units total) by mouth once a week. 08/17/20   Charlott Rakes, MD  fluticasone (FLONASE) 50 MCG/ACT nasal spray Place 1 spray into both nostrils daily for 7 days. 11/11/21 11/18/21  Francene Finders, PA-C  montelukast (SINGULAIR) 10 MG tablet TAKE 1 TABLET (10 MG TOTAL) BY MOUTH AT BEDTIME. 03/28/21 03/28/22  Charlott Rakes, MD  mupirocin cream (BACTROBAN) 2 % Apply 1 application topically 2 (two) times daily. Apply to right ear 07/11/17   Charlott Rakes, MD  nicotine (NICODERM CQ) 21 mg/24hr patch Place 1 patch (21 mg total) onto the skin daily. 08/16/20   Charlott Rakes, MD  umeclidinium bromide (INCRUSE ELLIPTA) 62.5 MCG/INH AEPB Inhale 1 puff into the lungs daily. 08/16/20   Charlott Rakes, MD    Allergies    Lisinopril  Review of Systems   Review of Systems  Constitutional:  Positive for appetite change, fatigue and fever.  HENT:  Positive for congestion. Negative for sinus pressure and sinus pain.   Respiratory:  Positive for cough and shortness of breath.   Cardiovascular:  Negative for chest pain, palpitations and leg swelling.  Gastrointestinal:  Positive for nausea and vomiting. Negative for abdominal pain and diarrhea.  Genitourinary:  Negative for dysuria.  Neurological:  Positive for light-headedness and headaches. Negative for dizziness and syncope.  All other systems reviewed and are negative.  Physical Exam Updated Vital Signs BP (!) 171/74   Pulse 61   Temp 98.3 F (36.8 C)   Resp 18   Ht 5\' 4"  (1.626 m)   Wt 54.1 kg   LMP 03/29/2011   SpO2 99%   BMI 20.47 kg/m   Physical Exam Vitals and nursing note reviewed.  Constitutional:      General: She is not in acute distress.    Appearance: Normal appearance. She is ill-appearing. She is not toxic-appearing.  HENT:     Head: Normocephalic and atraumatic.     Nose: Congestion present.     Mouth/Throat:     Mouth: Mucous membranes are  moist.     Pharynx: Oropharynx is clear.  Eyes:     General: No scleral icterus.    Extraocular Movements: Extraocular movements intact.     Pupils: Pupils are equal, round, and reactive to light.  Cardiovascular:     Rate and Rhythm: Normal rate and regular rhythm.     Chest Wall: PMI is displaced.     Pulses: Normal pulses.  Heart sounds: No murmur heard. Pulmonary:     Effort: Pulmonary effort is normal. No respiratory distress.     Breath sounds: Examination of the right-upper field reveals decreased breath sounds. Examination of the right-middle field reveals decreased breath sounds. Examination of the right-lower field reveals decreased breath sounds and rales. Examination of the left-lower field reveals rales. Decreased breath sounds and rales present.  Abdominal:     General: Bowel sounds are normal. There is no distension.     Palpations: Abdomen is soft.     Tenderness: There is no abdominal tenderness.  Musculoskeletal:        General: Tenderness present.     Cervical back: Normal range of motion. No tenderness.     Right lower leg: No edema.     Left lower leg: No edema.  Skin:    General: Skin is warm and dry.     Capillary Refill: Capillary refill takes less than 2 seconds.     Coloration: Skin is not jaundiced.  Neurological:     General: No focal deficit present.     Mental Status: She is alert and oriented to person, place, and time. Mental status is at baseline.  Psychiatric:        Mood and Affect: Mood normal.        Behavior: Behavior normal.    ED Results / Procedures / Treatments   Labs (all labs ordered are listed, but only abnormal results are displayed) Labs Reviewed  COMPREHENSIVE METABOLIC PANEL - Abnormal; Notable for the following components:      Result Value   Sodium 132 (*)    Chloride 93 (*)    Glucose, Bld 117 (*)    All other components within normal limits  TROPONIN I (HIGH SENSITIVITY) - Abnormal; Notable for the following  components:   Troponin I (High Sensitivity) 22 (*)    All other components within normal limits  TROPONIN I (HIGH SENSITIVITY) - Abnormal; Notable for the following components:   Troponin I (High Sensitivity) 22 (*)    All other components within normal limits  RESP PANEL BY RT-PCR (FLU A&B, COVID) [id number]  MRSA NEXT GEN BY PCR, NASAL  LIPASE, BLOOD  CBC WITH DIFFERENTIAL/PLATELET   EKG EKG Interpretation  Date/Time:  Monday November 13 2021 11:44:54 EST Ventricular Rate:  69 PR Interval:  110 QRS Duration: 84 QT Interval:  402 QTC Calculation: 430 R Axis:   67 Text Interpretation: Sinus rhythm with short PR Otherwise normal ECG Confirmed by Lennice Sites ([id number]) on 11/13/2021 12:18:53 PM  Radiology DG Chest 2 View  Result Date: 11/13/2021 CLINICAL DATA:  Vomiting.  Dizziness.  Sinus infection. EXAM: CHEST - 2 VIEW COMPARISON:  01/14/2016 FINDINGS: The heart size and mediastinal contours are within normal limits. 2.8 cm nodular opacity is seen in the right upper lobe. Another 1.5 cm nodular opacity is seen in the right lung base. New pulmonary infiltrate is seen in the inferior right upper lobe. No evidence of pleural effusion. Mild asymmetric soft tissue prominence is seen in the right hilum, suspicious for hilar lymphadenopathy. : Two right lung nodules and possible right hilar lymphadenopathy, raising suspicion for malignancy. Chest CT with contrast is recommended for further evaluation. New pulmonary infiltrate in inferior right upper lobe. Electronically Signed   By: Marlaine Hind M.D.   On: 11/13/2021 12:34   DG Lumbar Spine Complete  Result Date: 11/13/2021 CLINICAL DATA:  Back pain, possible malignancy on chest radiograph EXAM: LUMBAR SPINE - COMPLETE  4+ VIEW COMPARISON:  None. FINDINGS: There are 5 lumbar vertebral bodies. No pars break. Vertebral body heights and alignment are maintained. 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 Disc space narrowing is present and greatest at  L5-S1. Facet hypertrophy is greatest at lower lumbar levels. IMPRESSION: No compression fracture or large lesion. There is limited evaluation for malignancy by radiograph. 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 Electronically Signed   By: Macy Mis M.D.   On: 11/13/2021 13:31   CT Head Wo Contrast  Result Date: 11/13/2021 CLINICAL DATA:  Weakness. EXAM: CT HEAD WITHOUT CONTRAST TECHNIQUE: Contiguous axial images were obtained from the base of the skull through the vertex without intravenous contrast. COMPARISON:  None. FINDINGS: Brain: 3 x 2 cm intraparenchymal hemorrhage is noted in the right cerebellar hemisphere. Ventricular size is within normal limits. No midline shift is noted. Vascular: No hyperdense vessel or unexpected calcification. Skull: Normal. Negative for fracture or focal lesion. Sinuses/Orbits: No acute finding. Other: None. IMPRESSION: 3 x 2 cm intraparenchymal hemorrhage is noted in right cerebellar hemisphere. Critical Value/emergent results were called by telephone at the time of interpretation on 11/13/2021 at 3:59 pm to provider Theodis Blaze , who verbally acknowledged these results. Electronically Signed   By: Marijo Conception M.D.   On: 11/13/2021 15:59   CT Chest W Contrast  Result Date: 11/13/2021 CLINICAL DATA:  Abnormal chest x-ray EXAM: CT CHEST WITH CONTRAST TECHNIQUE: Multidetector CT imaging of the chest was performed during intravenous contrast administration. CONTRAST:  89mL OMNIPAQUE IOHEXOL 300 MG/ML  SOLN COMPARISON:  Same day chest x-ray FINDINGS: Cardiovascular: Normal heart size. Three-vessel coronary artery calcifications. Atherosclerotic disease of the thoracic aorta. No suspicious filling defects of the central pulmonary arteries. Mediastinum/Nodes: Markedly enlarged right hilar lymph node, measuring up to 2.4 cm in short axis. Mildly enlarged right lower paratracheal lymph node measuring 1.0 cm in short axis on series 2, image 61.  Rounded left hilar lymph node with central low density measuring 8 mm on series 2, image 88. Esophagus is unremarkable. Lungs/Pleura: Central airways are patent. Centrilobular emphysema. Spiculated left upper lobe pulmonary nodule measuring 2.5 x 1.5 cm on series 4, image 59 with adjacent satellite nodules measuring 5 mm on image 46 and 6 mm on image 51. Additional spiculated pulmonary nodule is seen more inferiorly in the right upper lobe which measures 0.9 x 0.7 cm on image 76. Intralobular septal thickening is seen in the peripheral right upper lobe adjacent to the nodules. Solid pulmonary nodule of the left upper lobe measuring 6 mm on series 4 image 70. Upper Abdomen: Nodular thickening of the left adrenal gland. Musculoskeletal: No chest wall abnormality. No acute or significant osseous findings. IMPRESSION: 1. Spiculated solid pulmonary nodules of the right upper lobe, largest measures 2.5 cm, findings are concerning for primary lung malignancy. 2. Intralobular septal thickening is seen in the peripheral right upper lobe adjacent to the nodules, concerning for lymphangitic spread. 3. Solid pulmonary nodule of the left upper lobe measuring 6 mm, concerning for additional site of disease. 4. Markedly enlarged right hilar lymph node and mildly enlarged right lower paratracheal lymph node, concerning for nodal metastatic disease. 5. Subcentimeter left hilar lymph node with rounded morphology, possibly an additional site of metastatic disease. 6. Nodular thickening of the left adrenal gland, concerning for metastatic disease. 7. Aortic Atherosclerosis (ICD10-I70.0) and Emphysema (ICD10-J43.9). Electronically Signed   By: Yetta Glassman M.D.   On: 11/13/2021 14:31    Procedures .Critical Care Performed by: Erskine Speed,  Kyla Balzarine, PA-C Authorized by: Mickie Hillier, PA-C   Critical care provider statement:    Critical care time (minutes):  40   Critical care time was exclusive of:  Separately billable  procedures and treating other patients   Critical care was necessary to treat or prevent imminent or life-threatening deterioration of the following conditions:  CNS failure or compromise   Critical care was time spent personally by me on the following activities:  Development of treatment plan with patient or surrogate, discussions with consultants, discussions with primary provider, evaluation of patient's response to treatment, examination of patient, obtaining history from patient or surrogate, review of old charts, re-evaluation of patient's condition, pulse oximetry, ordering and review of radiographic studies, ordering and review of laboratory studies and ordering and performing treatments and interventions   I assumed direction of critical care for this patient from another provider in my specialty: no     Care discussed with: admitting provider     Medications Ordered in ED Medications  nicardipine (CARDENE) 20mg  in 0.86% saline 220ml IV infusion (0.1 mg/ml) (has no administration in time range)  Chlorhexidine Gluconate Cloth 2 % PADS 6 each (has no administration in time range)  morphine 4 MG/ML injection 4 mg (4 mg Intravenous Given 11/13/21 1250)  ondansetron (ZOFRAN) injection 4 mg (4 mg Intravenous Given 11/13/21 1250)  diphenhydrAMINE (BENADRYL) injection 12.5 mg (12.5 mg Intravenous Given 11/13/21 1251)  iohexol (OMNIPAQUE) 300 MG/ML solution 60 mL (60 mLs Intravenous Contrast Given 11/13/21 1341)   ED Course  I have reviewed the triage vital signs and the nursing notes.  Pertinent labs & imaging results that were available during my care of the patient were reviewed by me and considered in my medical decision making (see chart for details).   1621: Spoke with patient who does endorse falling on Saturday and hitting her head while she was bending over to get some hydrogen peroxide from under the sink.  She does continue to endorse that she began having migraine on Friday  however. MDM Rules/Calculators/A&P 55 year old female who presents to the emergency department with vomiting.  Initially symptoms concerning for upper respiratory infection or viral infection given congestion, cough and GI symptoms. Initial chest x-ray with concern for malignancy and recommendation of CT chest. No leukocytosis or evidence of pneumonia, history of fevers on exam.  Doubt infectious process.  COVID and flu negative She obtain CT chest which shows spiculated solid pulmonary nodule of the right upper lobe up to 2.5cm concerning for malignancy.  She also has evidence of lymphangitic spread as well as left adrenal gland concerning for metastatic disease. She did initially discuss having low back pain, obtained radiograph of the lumbar spine to rule out lytic lesion of the spine which was negative. She also initially discussed migraine headache with vomiting.  She stated on initial interview that she has history of migraines and this feels like her typical migraine.  Given findings on CT chest, move forward with CT head with concern for metastatic spread. --I called daughter, Olivia Mackie, at bedside with patient and discussed findings of chest x-ray and CT chest.  I answered all questions at bedside.  I discussed that these findings were concerning for malignancy, however require biopsy and further investigation.  Patient verbalizes understanding of findings at this time and agrees with moving forward with CT head. Called by radiologist with results that patient has 3 x 2 cm intraparenchymal hemorrhage in the right cerebellar hemisphere.  I went back to discuss  findings with patient who does endorse falling on Saturday and hitting her head while bending over to get hydrogen peroxide under the sink.  However she states that her migraine symptoms began on Saturday prior to falling.  Concern for underlying malignancy. 1657: Consulted and spoke with Dr. Zada Finders, with neurosurgery who recommends patient  being transferred to [facility] and monitored in neuro ICU.  He requests systolic blood pressure less than 160.  He does not recommend steroids at this time. --Discussed CT head findings with daughter at bedside with patient.  Discussed that the patient will be admitted to [facility] and monitored in the neuro ICU.  She is emotional at bedside.  I have answered all of her questions at this time and discussed plan.  She verbalizes understanding.  The patient and daughter are agreeable to admission at this time.  Patient admission orders placed.  Cardene drip ordered if blood pressure elevates above 810 systolic.  She is currently below goal.  Patient transferred to [facility].  At time of transfer she is neurologically intact.  No focal neurological deficits and she has had no change in mental status while she has been here.  Final Clinical Impression(s) / ED Diagnoses Final diagnoses:  Lung nodule  Intracranial hemorrhage [facility])    Rx / DC Orders ED Discharge Orders     None        Mickie Hillier, PA-C 11/13/21 2205    Lennice Sites, DO 11/14/21 0730

## 2021-11-13 NOTE — Telephone Encounter (Signed)
  Chief Complaint: ears clogged. Coughing up blood streaked sputum Symptoms: decrease hearing, right ear sounds "click , click" left ear sounds "like a keyboard". Dizziness, recent dx sinus infection  Frequency: na  Pertinent Negatives: Patient denies difficulty breathing, ear draining, fever Disposition: [x] ED /[x] Urgent Care (no appt availability in office) / [] Appointment(In office/virtual)/ []  [facility]/ [] Home Care/ [] Refused Recommended Disposition  Additional Notes: c/o ears clogged worsening . Dx sinus infection. Difficulty tolerating amoxicillin due to vomiting after taking. No appt available . Sent to UC / ED due to coughing up blood streaked sputum with occasional "chunks of blood". Please advise

## 2021-11-13 NOTE — ED Notes (Signed)
ED Provider at bedside. 

## 2021-11-13 NOTE — ED Notes (Addendum)
Pt tolerated ice chips, drinking water.

## 2021-11-13 NOTE — ED Notes (Addendum)
V.o. Hold Cardene gtt until ICU, unless SBP stays >160, Autry PA.

## 2021-11-13 NOTE — ED Notes (Signed)
Traci Schildt-daughter's cell ph# [phone]

## 2021-11-13 NOTE — ED Notes (Signed)
Ice chips given for oral challenge.

## 2021-11-14 ENCOUNTER — Inpatient Hospital Stay (HOSPITAL_COMMUNITY): Payer: 59

## 2021-11-14 ENCOUNTER — Other Ambulatory Visit: Payer: Self-pay | Admitting: Neurological Surgery

## 2021-11-14 DIAGNOSIS — I614 Nontraumatic intracerebral hemorrhage in cerebellum: Secondary | ICD-10-CM | POA: Diagnosis present

## 2021-11-14 LAB — CBC
HCT: 34 % — ABNORMAL LOW (ref 36.0–46.0)
Hemoglobin: 11.7 g/dL — ABNORMAL LOW (ref 12.0–15.0)
MCH: 29.5 pg (ref 26.0–34.0)
MCHC: 34.4 g/dL (ref 30.0–36.0)
MCV: 85.6 fL (ref 80.0–100.0)
Platelets: 278 10*3/uL (ref 150–400)
RBC: 3.97 MIL/uL (ref 3.87–5.11)
RDW: 12.9 % (ref 11.5–15.5)
WBC: 10.1 10*3/uL (ref 4.0–10.5)
nRBC: 0 % (ref 0.0–0.2)

## 2021-11-14 LAB — CREATININE, SERUM
Creatinine, Ser: 0.72 mg/dL (ref 0.44–1.00)
GFR, Estimated: >60 mL/min (ref >60)

## 2021-11-14 LAB — MRSA NEXT GEN BY PCR, NASAL: MRSA by PCR Next Gen: NOT DETECTED

## 2021-11-14 MED ORDER — ONDANSETRON HCL 4 MG/2ML IJ SOLN
4.0000 mg | INTRAMUSCULAR | Status: DC | PRN
  Administered 2021-11-14 – 2021-11-20 (×3): 4 mg via INTRAVENOUS
  Filled 2021-11-14 (×3): qty 2

## 2021-11-14 MED ORDER — LORATADINE 10 MG PO TABS
10.0000 mg | ORAL_TABLET | Freq: Every day | ORAL | Status: DC
  Administered 2021-11-14 – 2021-11-22 (×8): 10 mg via ORAL
  Filled 2021-11-14 (×8): qty 1

## 2021-11-14 MED ORDER — HEPARIN SODIUM (PORCINE) 5000 UNIT/ML IJ SOLN: 5000.0000 [IU] | Freq: Three times a day (TID) | INTRAMUSCULAR | Status: DC

## 2021-11-14 MED ORDER — HYDROMORPHONE HCL 1 MG/ML IJ SOLN: 0.5000 mg | INTRAMUSCULAR | Status: DC | PRN

## 2021-11-14 MED ORDER — PROMETHAZINE HCL 25 MG PO TABS
12.5000 mg | ORAL_TABLET | ORAL | Status: DC | PRN
  Administered 2021-11-18 – 2021-11-20 (×2): 25 mg via ORAL
  Filled 2021-11-14 (×3): qty 1

## 2021-11-14 MED ORDER — MONTELUKAST SODIUM 10 MG PO TABS
10.0000 mg | ORAL_TABLET | Freq: Every day | ORAL | Status: DC
  Administered 2021-11-14 – 2021-11-21 (×8): 10 mg via ORAL
  Filled 2021-11-14 (×9): qty 1

## 2021-11-14 MED ORDER — NICOTINE 21 MG/24HR TD PT24
21.0000 mg | MEDICATED_PATCH | Freq: Every day | TRANSDERMAL | Status: DC
  Administered 2021-11-14 – 2021-11-22 (×8): 21 mg via TRANSDERMAL
  Filled 2021-11-14 (×8): qty 1

## 2021-11-14 MED ORDER — GADOBUTROL 1 MMOL/ML IV SOLN
5.0000 mL | Freq: Once | INTRAVENOUS | Status: AC | PRN
  Administered 2021-11-14: 5 mL via INTRAVENOUS

## 2021-11-14 MED ORDER — ACETAMINOPHEN 650 MG RE SUPP: 650.0000 mg | RECTAL | Status: DC | PRN

## 2021-11-14 MED ORDER — UMECLIDINIUM BROMIDE 62.5 MCG/ACT IN AEPB
1.0000 | INHALATION_SPRAY | Freq: Every day | RESPIRATORY_TRACT | Status: DC
  Administered 2021-11-14 – 2021-11-22 (×7): 1 via RESPIRATORY_TRACT
  Filled 2021-11-14 (×2): qty 7

## 2021-11-14 MED ORDER — AMLODIPINE BESYLATE 10 MG PO TABS
10.0000 mg | ORAL_TABLET | Freq: Every day | ORAL | Status: DC
  Administered 2021-11-14 – 2021-11-19 (×6): 10 mg via ORAL
  Filled 2021-11-14 (×6): qty 1

## 2021-11-14 MED ORDER — ACETAMINOPHEN 325 MG PO TABS
650.0000 mg | ORAL_TABLET | ORAL | Status: DC | PRN
  Administered 2021-11-16 – 2021-11-20 (×8): 650 mg via ORAL
  Filled 2021-11-14 (×9): qty 2

## 2021-11-14 MED ORDER — POLYETHYLENE GLYCOL 3350 17 G PO PACK: 17.0000 g | PACK | Freq: Every day | ORAL | Status: DC | PRN

## 2021-11-14 MED ORDER — FLUTICASONE FUROATE-VILANTEROL 200-25 MCG/ACT IN AEPB
1.0000 | INHALATION_SPRAY | Freq: Every day | RESPIRATORY_TRACT | Status: DC
  Administered 2021-11-14 – 2021-11-22 (×6): 1 via RESPIRATORY_TRACT
  Filled 2021-11-14 (×2): qty 28

## 2021-11-14 MED ORDER — HYDROCODONE-ACETAMINOPHEN 5-325 MG PO TABS
1.0000 | ORAL_TABLET | ORAL | Status: DC | PRN
  Administered 2021-11-14 – 2021-11-21 (×12): 1 via ORAL
  Filled 2021-11-14 (×12): qty 1

## 2021-11-14 MED ORDER — ONDANSETRON HCL 4 MG PO TABS
4.0000 mg | ORAL_TABLET | ORAL | Status: DC | PRN
  Administered 2021-11-14: 4 mg via ORAL
  Filled 2021-11-14: qty 1

## 2021-11-14 MED ORDER — ALBUTEROL SULFATE (2.5 MG/3ML) 0.083% IN NEBU: 2.5000 mg | INHALATION_SOLUTION | RESPIRATORY_TRACT | Status: DC | PRN

## 2021-11-14 MED ORDER — LABETALOL HCL 5 MG/ML IV SOLN
10.0000 mg | INTRAVENOUS | Status: DC | PRN
  Administered 2021-11-15: 13:00:00 10 mg via INTRAVENOUS
  Administered 2021-11-15: 16:00:00 40 mg via INTRAVENOUS
  Administered 2021-11-15 (×2): 10 mg via INTRAVENOUS
  Administered 2021-11-16: 20 mg via INTRAVENOUS
  Filled 2021-11-14 (×2): qty 4
  Filled 2021-11-14: qty 8
  Filled 2021-11-14: qty 4

## 2021-11-14 MED ORDER — DOCUSATE SODIUM 100 MG PO CAPS
100.0000 mg | ORAL_CAPSULE | Freq: Two times a day (BID) | ORAL | Status: DC
  Administered 2021-11-14 – 2021-11-22 (×14): 100 mg via ORAL
  Filled 2021-11-14 (×16): qty 1

## 2021-11-14 MED ORDER — MUPIROCIN CALCIUM 2 % EX CREA
1.0000 "application " | TOPICAL_CREAM | Freq: Two times a day (BID) | CUTANEOUS | Status: DC
  Administered 2021-11-16 – 2021-11-22 (×13): 1 via TOPICAL
  Filled 2021-11-14 (×3): qty 15

## 2021-11-14 NOTE — Anesthesia Preprocedure Evaluation (Addendum)
Anesthesia Evaluation  Patient identified by MRN, date of birth, ID band Patient awake  General Assessment Comment:Pt sleepy  History of Anesthesia Complications Negative for: history of anesthetic complications  Airway Mallampati: I  TM Distance: >3 FB Neck ROM: Full    Dental  (+) Edentulous Upper, Edentulous Lower   Pulmonary COPD,  COPD inhaler, Current Smoker and Patient abstained from smoking.,  11/13/2021 SARS coronavirus NEG   breath sounds clear to auscultation       Cardiovascular hypertension, Pt. on medications (-) angina Rhythm:Regular Rate:Normal     Neuro/Psych R cerebellar tumor with hemorrhage    GI/Hepatic Neg liver ROS, GERD  Poorly Controlled,  Endo/Other  negative endocrine ROS  Renal/GU negative Renal ROS     Musculoskeletal   Abdominal   Peds  Hematology negative hematology ROS (+)   Anesthesia Other Findings   Reproductive/Obstetrics                            Anesthesia Physical Anesthesia Plan  ASA: 3  Anesthesia Plan: General   Post-op Pain Management: Tylenol PO (pre-op)   Induction: Intravenous  PONV Risk Score and Plan: 2 and Ondansetron and Dexamethasone  Airway Management Planned: Oral ETT  Additional Equipment: Arterial line  Intra-op Plan:   Post-operative Plan: Extubation in OR  Informed Consent: I have reviewed the patients History and Physical, chart, labs and discussed the procedure including the risks, benefits and alternatives for the proposed anesthesia with the patient or authorized representative who has indicated his/her understanding and acceptance.       Plan Discussed with: CRNA and Surgeon  Anesthesia Plan Comments:        Anesthesia Quick Evaluation

## 2021-11-14 NOTE — Progress Notes (Signed)
Neurosurgery Service Progress Note  Subjective: No acute events overnight, cont'd headaches, no new N/V   Objective: Vitals:   11/14/21 0715 11/14/21 0730 11/14/21 0745 11/14/21 0800  BP: 140/68 (!) 124/57 135/74   Pulse: 62 61 62   Resp: 18 19 17    Temp:    98.8 F (37.1 C)  TempSrc:    Oral  SpO2: 96% 95% 96%   Weight:      Height:        Physical Exam: Strength 5/5 x4, SILTx4, +R appendicular ataxia  Assessment & Plan: 55 y.o. 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  -CT brainlab protocol -OR tomorrow for R craniotomy and resection -NPO p MN -keep in ICU - will need to be in ICU tomorrow post-op  Judith Part  11/14/21 8:34 AM

## 2021-11-14 NOTE — Evaluation (Signed)
Physical Therapy Evaluation Patient Details Name: Tanya Jordan MRN: [id number] DOB: [birth year] Today's Date: 11/14/2021  History of Present Illness  Pt is a 55yo female who was admitted for severe headache and emesis. 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 Pt planned for craniotomy on 12/14. PMH: COPD   Clinical Impression  Pt admitted with above. Pt currently functioning at supervision level with noted headache. Pt with c/o mild dizziness and nausea with mobility. Pt reports "Im having surgery tomorrow." PT to re-evaluate pt's mobility s/p craniotomy scheduled for 12/14.        Recommendations for follow up therapy are one component of a multi-disciplinary discharge planning process, led by the attending physician.  Recommendations may be updated based on patient status, additional functional criteria and insurance authorization.  Follow Up Recommendations No PT follow up (will re-assess s/p craniotomy for updated d/c recommendation)    Assistance Recommended at Discharge Intermittent Supervision/Assistance  Functional Status Assessment Patient has had a recent decline in their functional status and demonstrates the ability to make significant improvements in function in a reasonable and predictable amount of time.  Equipment Recommendations   (TBD post craniotomy)    Recommendations for Other Services       Precautions / Restrictions Precautions Precautions: Fall Precaution Comments: pt with dizziness with movement, h/o emesis with movement Restrictions Weight Bearing Restrictions: No      Mobility  Bed Mobility Overal bed mobility: Needs Assistance Bed Mobility: Supine to Sit;Sit to Supine     Supine to sit: Supervision Sit to supine: Supervision   General bed mobility comments: HOB elevated, no physical assist, supervision due to h/o dizziness with movement    Transfers Overall transfer level: Needs assistance Equipment used: None Transfers: Sit to/from  Stand Sit to Stand: Min guard           General transfer comment: min guard for safety, no physical assist needed    Ambulation/Gait Ambulation/Gait assistance: Min guard Gait Distance (Feet): 60 Feet Assistive device: None Gait Pattern/deviations: Step-through pattern;Decreased stride length Gait velocity: dec Gait velocity interpretation: <1.31 ft/sec, indicative of household ambulator   General Gait Details: mildly guarded and unsteady but no overt LOB, pt denied worsening of dizziness or nausea as she experienced earlier this morning  Stairs            Wheelchair Mobility    Modified Rankin (Stroke Patients Only) Modified Rankin (Stroke Patients Only) Pre-Morbid Rankin Score: No symptoms Modified Rankin: Slight disability     Balance Overall balance assessment: Mild deficits observed, not formally tested                                           Pertinent Vitals/Pain Pain Assessment: 0-10 Pain Score: 7  Pain Location: headache Pain Descriptors / Indicators: Headache Pain Intervention(s): Premedicated before session    Home Living Family/patient expects to be discharged to:: Private residence Living Arrangements: Children (dtr and grandson - 58yo) Available Help at Discharge: Family;Available PRN/intermittently (dtr works but states she could figure out 24/7 assist if needed) Type of Home: House Home Access: Stairs to enter Entrance Stairs-Rails: Horticulturist, commercial of Steps: 3   Home Layout: One level Home Equipment: None      Prior Function Prior Level of Function : Independent/Modified Independent             Mobility Comments: indep,  works at the [employer] ADLs Comments: no     Hand Dominance   Dominant Hand: Right    Extremity/Trunk Assessment   Upper Extremity Assessment Upper Extremity Assessment: Overall WFL for tasks assessed    Lower Extremity Assessment Lower Extremity Assessment: Overall WFL  for tasks assessed    Cervical / Trunk Assessment Cervical / Trunk Assessment: Normal  Communication   Communication: HOH (extremely HOH)  Cognition Arousal/Alertness: Awake/alert Behavior During Therapy: Flat affect Overall Cognitive Status: Within Functional Limits for tasks assessed                                 General Comments: pt extremely HOH, delayed response time occasionally but appropriate, followed all commands        General Comments General comments (skin integrity, edema, etc.): VSS    Exercises     Assessment/Plan    PT Assessment Patient needs continued PT services  PT Problem List Decreased strength;Decreased activity tolerance;Decreased balance;Decreased mobility       PT Treatment Interventions DME instruction;Gait training;Stair training;Functional mobility training;Therapeutic activities;Therapeutic exercise;Balance training;Neuromuscular re-education    PT Goals (Current goals can be found in the Care Plan section)  Acute Rehab PT Goals Patient Stated Goal: stop the pain PT Goal Formulation: With patient Time For Goal Achievement: 11/23/2021 Potential to Achieve Goals: Good Additional Goals Additional Goal #1: Pt to score >19 on DGI to indicate minimal falls risk.    Frequency Min 4X/week   Barriers to discharge        Co-evaluation               AM-PAC PT "6 Clicks" Mobility  Outcome Measure Help needed turning from your back to your side while in a flat bed without using bedrails?: None Help needed moving from lying on your back to sitting on the side of a flat bed without using bedrails?: None Help needed moving to and from a bed to a chair (including a wheelchair)?: None Help needed standing up from a chair using your arms (e.g., wheelchair or bedside chair)?: None Help needed to walk in hospital room?: A Little Help needed climbing 3-5 steps with a railing? : A Little 6 Click Score: 22    End of Session  Equipment Utilized During Treatment: Gait belt Activity Tolerance: Patient tolerated treatment well Patient left: in bed;with call bell/phone within reach;with bed alarm set;with family/visitor present Nurse Communication: Mobility status PT Visit Diagnosis: Muscle weakness (generalized) (M62.81);Difficulty in walking, not elsewhere classified (R26.2)    Time: 4650-3546 PT Time Calculation (min) (ACUTE ONLY): 22 min   Charges:   PT Evaluation $PT Eval Low Complexity: 1 Low          Kittie Plater, PT, DPT Acute Rehabilitation Services Pager #: [phone] Office #: [phone]   Berline Lopes 11/14/2021, 3:06 PM

## 2021-11-14 NOTE — Progress Notes (Signed)
OT Cancellation Note  Patient Details Name: Tanya Jordan MRN: [id number] DOB: August 22, 1966   Cancelled Treatment:    Reason Eval/Treat Not Completed: Other (comment) (OT will hold evaluation s/p Craniotomy 12/14)   Billey Chang, OTR/L  Acute Rehabilitation Services Pager: [phone] Office: [phone] .  11/14/2021, 10:37 AM

## 2021-11-14 NOTE — Progress Notes (Signed)
°  Transition of Care [facility]) Screening Note   Patient Details  Name: ARSEMA TUSING Date of Birth: 09/15/1966   Transition of Care [facility]) CM/SW Contact:    Benard Halsted, LCSW Phone Number: 11/14/2021, 10:07 AM    Transition of Care Department [facility]) has reviewed patient and no TOC needs have been identified at this time. We will continue to monitor patient advancement through interdisciplinary progression rounds. If new patient transition needs arise, please place a TOC consult.

## 2021-11-15 ENCOUNTER — Encounter (HOSPITAL_COMMUNITY)
Admission: EM | Disposition: A | Discharge status: Home Health | Payer: Self-pay | Source: Home / Self Care | Attending: Neurological Surgery

## 2021-11-15 ENCOUNTER — Inpatient Hospital Stay (HOSPITAL_COMMUNITY): Payer: 59 | Admitting: Certified Registered Nurse Anesthetist

## 2021-11-15 HISTORY — PX: APPLICATION OF CRANIAL NAVIGATION: SHX6578

## 2021-11-15 HISTORY — PX: CRANIOTOMY: SHX93

## 2021-11-15 LAB — SODIUM
Sodium: 127 mmol/L — ABNORMAL LOW (ref 135–145)
Sodium: 131 mmol/L — ABNORMAL LOW (ref 135–145)
Sodium: 132 mmol/L — ABNORMAL LOW (ref 135–145)
Sodium: 133 mmol/L — ABNORMAL LOW (ref 135–145)

## 2021-11-15 LAB — SURGICAL PCR SCREEN
MRSA, PCR: NEGATIVE
Staphylococcus aureus: NEGATIVE

## 2021-11-15 LAB — BASIC METABOLIC PANEL
Anion gap: 10 (ref 5–15)
BUN: 11 mg/dL (ref 6–20)
CO2: 24 mmol/L (ref 22–32)
Calcium: 7.6 mg/dL — ABNORMAL LOW (ref 8.9–10.3)
Chloride: 84 mmol/L — ABNORMAL LOW (ref 98–111)
Creatinine, Ser: 0.46 mg/dL (ref 0.44–1.00)
GFR, Estimated: >60 mL/min (ref >60)
Glucose, Bld: 123 mg/dL — ABNORMAL HIGH (ref 70–99)
Potassium: 3 mmol/L — ABNORMAL LOW (ref 3.5–5.1)
Sodium: 118 mmol/L — CL (ref 135–145)

## 2021-11-15 LAB — POCT I-STAT 7, (LYTES, BLD GAS, ICA,H+H)
Acid-Base Excess: 4 mmol/L — ABNORMAL HIGH (ref 0.0–2.0)
Bicarbonate: 29.1 mmol/L — ABNORMAL HIGH (ref 20.0–28.0)
Calcium, Ion: 1.12 mmol/L — ABNORMAL LOW (ref 1.15–1.40)
HCT: 36 % (ref 36.0–46.0)
Hemoglobin: 12.2 g/dL (ref 12.0–15.0)
O2 Saturation: 100 %
Potassium: 3.4 mmol/L — ABNORMAL LOW (ref 3.5–5.1)
Sodium: 126 mmol/L — ABNORMAL LOW (ref 135–145)
TCO2: 30 mmol/L (ref 22–32)
pCO2 arterial: 43.4 mmHg (ref 32.0–48.0)
pH, Arterial: 7.435 (ref 7.350–7.450)
pO2, Arterial: 358 mmHg — ABNORMAL HIGH (ref 83.0–108.0)

## 2021-11-15 LAB — TYPE AND SCREEN
ABO/RH(D): O POS
Antibody Screen: NEGATIVE

## 2021-11-15 LAB — ABO/RH: ABO/RH(D): O POS

## 2021-11-15 SURGERY — CRANIOTOMY TUMOR EXCISION
Anesthesia: General | Laterality: Right

## 2021-11-15 MED ORDER — LIDOCAINE-EPINEPHRINE 1 %-1:100000 IJ SOLN
INTRAMUSCULAR | Status: DC | PRN
  Administered 2021-11-15: 7 mL

## 2021-11-15 MED ORDER — DEXAMETHASONE SODIUM PHOSPHATE 10 MG/ML IJ SOLN
INTRAMUSCULAR | Status: AC
  Filled 2021-11-15: qty 1

## 2021-11-15 MED ORDER — SODIUM CHLORIDE 0.9 % IV SOLN: INTRAVENOUS | Status: DC | PRN

## 2021-11-15 MED ORDER — ONDANSETRON HCL 4 MG/2ML IJ SOLN
INTRAMUSCULAR | Status: AC
  Filled 2021-11-15: qty 2

## 2021-11-15 MED ORDER — MIDAZOLAM HCL 2 MG/2ML IJ SOLN: 0.5000 mg | Freq: Once | INTRAMUSCULAR | Status: DC | PRN

## 2021-11-15 MED ORDER — PROPOFOL 10 MG/ML IV BOLUS
INTRAVENOUS | Status: AC
  Filled 2021-11-15: qty 20

## 2021-11-15 MED ORDER — ESMOLOL HCL 100 MG/10ML IV SOLN
INTRAVENOUS | Status: DC | PRN
  Administered 2021-11-15: 20 mg via INTRAVENOUS
  Administered 2021-11-15: 40 mg via INTRAVENOUS
  Administered 2021-11-15 (×2): 20 mg via INTRAVENOUS

## 2021-11-15 MED ORDER — LABETALOL HCL 5 MG/ML IV SOLN
INTRAVENOUS | Status: DC | PRN
  Administered 2021-11-15: 10 mg via INTRAVENOUS

## 2021-11-15 MED ORDER — PROMETHAZINE HCL 25 MG/ML IJ SOLN: 6.2500 mg | INTRAMUSCULAR | Status: DC | PRN

## 2021-11-15 MED ORDER — THROMBIN 5000 UNITS EX SOLR
CUTANEOUS | Status: AC
  Filled 2021-11-15: qty 5000

## 2021-11-15 MED ORDER — OXYCODONE HCL 5 MG/5ML PO SOLN: 5.0000 mg | Freq: Once | ORAL | Status: DC | PRN

## 2021-11-15 MED ORDER — ENSURE PRE-SURGERY PO LIQD: 296.0000 mL | Freq: Once | ORAL | Status: DC

## 2021-11-15 MED ORDER — ACETAMINOPHEN 10 MG/ML IV SOLN
INTRAVENOUS | Status: DC | PRN
  Administered 2021-11-15: 1000 mg via INTRAVENOUS

## 2021-11-15 MED ORDER — MEPERIDINE HCL 25 MG/ML IJ SOLN: 6.2500 mg | INTRAMUSCULAR | Status: DC | PRN

## 2021-11-15 MED ORDER — NITROGLYCERIN 0.2 MG/ML ON CALL CATH LAB
INTRAVENOUS | Status: DC | PRN
  Administered 2021-11-15 (×3): 20 ug via INTRAVENOUS

## 2021-11-15 MED ORDER — MIDAZOLAM HCL 5 MG/5ML IJ SOLN
INTRAMUSCULAR | Status: DC | PRN
  Administered 2021-11-15: 1 mg via INTRAVENOUS

## 2021-11-15 MED ORDER — HYDROMORPHONE HCL 1 MG/ML IJ SOLN: 1.0000 mg | INTRAMUSCULAR | Status: DC | PRN

## 2021-11-15 MED ORDER — CHLORHEXIDINE GLUCONATE CLOTH 2 % EX PADS
6.0000 | MEDICATED_PAD | Freq: Once | CUTANEOUS | Status: AC
  Administered 2021-11-15: 6 via TOPICAL

## 2021-11-15 MED ORDER — 0.9 % SODIUM CHLORIDE (POUR BTL) OPTIME
TOPICAL | Status: DC | PRN
  Administered 2021-11-15: 10:00:00 2000 mL

## 2021-11-15 MED ORDER — DEXAMETHASONE SODIUM PHOSPHATE 10 MG/ML IJ SOLN
INTRAMUSCULAR | Status: DC | PRN
  Administered 2021-11-15: 10 mg via INTRAVENOUS

## 2021-11-15 MED ORDER — CHLORHEXIDINE GLUCONATE CLOTH 2 % EX PADS
6.0000 | MEDICATED_PAD | Freq: Once | CUTANEOUS | Status: AC
  Administered 2021-11-15: 06:00:00 6 via TOPICAL

## 2021-11-15 MED ORDER — ACETAMINOPHEN 10 MG/ML IV SOLN
INTRAVENOUS | Status: AC
  Filled 2021-11-15: qty 100

## 2021-11-15 MED ORDER — SUCCINYLCHOLINE CHLORIDE 200 MG/10ML IV SOSY
PREFILLED_SYRINGE | INTRAVENOUS | Status: DC | PRN
  Administered 2021-11-15: 100 mg via INTRAVENOUS

## 2021-11-15 MED ORDER — LIDOCAINE-EPINEPHRINE 1 %-1:100000 IJ SOLN
INTRAMUSCULAR | Status: AC
  Filled 2021-11-15: qty 1

## 2021-11-15 MED ORDER — MANNITOL 25 % IV SOLN
INTRAVENOUS | Status: DC | PRN
  Administered 2021-11-15: 54 g via INTRAVENOUS

## 2021-11-15 MED ORDER — ROCURONIUM BROMIDE 10 MG/ML (PF) SYRINGE
PREFILLED_SYRINGE | INTRAVENOUS | Status: AC
  Filled 2021-11-15: qty 10

## 2021-11-15 MED ORDER — OXYCODONE HCL 5 MG PO TABS: 5.0000 mg | ORAL_TABLET | Freq: Once | ORAL | Status: DC | PRN

## 2021-11-15 MED ORDER — SODIUM CHLORIDE 1 G PO TABS
1.0000 g | ORAL_TABLET | Freq: Two times a day (BID) | ORAL | Status: DC
  Administered 2021-11-16 – 2021-11-22 (×13): 1 g via ORAL
  Filled 2021-11-15 (×14): qty 1

## 2021-11-15 MED ORDER — FENTANYL CITRATE (PF) 250 MCG/5ML IJ SOLN
INTRAMUSCULAR | Status: AC
  Filled 2021-11-15: qty 5

## 2021-11-15 MED ORDER — LIDOCAINE 2% (20 MG/ML) 5 ML SYRINGE
INTRAMUSCULAR | Status: AC
  Filled 2021-11-15: qty 5

## 2021-11-15 MED ORDER — ONDANSETRON HCL 4 MG/2ML IJ SOLN
INTRAMUSCULAR | Status: DC | PRN
  Administered 2021-11-15: 4 mg via INTRAVENOUS

## 2021-11-15 MED ORDER — PHENYLEPHRINE HCL-NACL 20-0.9 MG/250ML-% IV SOLN
INTRAVENOUS | Status: DC | PRN
  Administered 2021-11-15: 50 ug/min via INTRAVENOUS

## 2021-11-15 MED ORDER — FENTANYL CITRATE (PF) 100 MCG/2ML IJ SOLN: 25.0000 ug | INTRAMUSCULAR | Status: DC | PRN

## 2021-11-15 MED ORDER — ROCURONIUM BROMIDE 10 MG/ML (PF) SYRINGE
PREFILLED_SYRINGE | INTRAVENOUS | Status: DC | PRN
  Administered 2021-11-15: 100 mg via INTRAVENOUS
  Administered 2021-11-15: 50 mg via INTRAVENOUS

## 2021-11-15 MED ORDER — BACITRACIN ZINC 500 UNIT/GM EX OINT
TOPICAL_OINTMENT | CUTANEOUS | Status: AC
  Filled 2021-11-15: qty 28.35

## 2021-11-15 MED ORDER — ENSURE PRE-SURGERY PO LIQD
296.0000 mL | Freq: Once | ORAL | Status: AC
  Administered 2021-11-15: 05:00:00 296 mL via ORAL
  Filled 2021-11-15: qty 296

## 2021-11-15 MED ORDER — SODIUM CHLORIDE 3 % IV SOLN: INTRAVENOUS | Status: DC

## 2021-11-15 MED ORDER — PHENYLEPHRINE HCL (PRESSORS) 10 MG/ML IV SOLN
INTRAVENOUS | Status: DC | PRN
  Administered 2021-11-15: 80 ug via INTRAVENOUS
  Administered 2021-11-15: 40 ug via INTRAVENOUS

## 2021-11-15 MED ORDER — CEFAZOLIN SODIUM-DEXTROSE 2-4 GM/100ML-% IV SOLN
2.0000 g | INTRAVENOUS | Status: AC
  Administered 2021-11-15: 09:00:00 2 g via INTRAVENOUS

## 2021-11-15 MED ORDER — SUGAMMADEX SODIUM 200 MG/2ML IV SOLN
INTRAVENOUS | Status: DC | PRN
  Administered 2021-11-15: 200 mg via INTRAVENOUS

## 2021-11-15 MED ORDER — THROMBIN 20000 UNITS EX SOLR: CUTANEOUS | Status: DC | PRN

## 2021-11-15 MED ORDER — BACITRACIN ZINC 500 UNIT/GM EX OINT
TOPICAL_OINTMENT | CUTANEOUS | Status: DC | PRN
  Administered 2021-11-15: 1 via TOPICAL

## 2021-11-15 MED ORDER — LIDOCAINE 2% (20 MG/ML) 5 ML SYRINGE
INTRAMUSCULAR | Status: DC | PRN
  Administered 2021-11-15: 40 mg via INTRAVENOUS

## 2021-11-15 MED ORDER — THROMBIN 5000 UNITS EX SOLR: OROMUCOSAL | Status: DC | PRN

## 2021-11-15 MED ORDER — PROPOFOL 10 MG/ML IV BOLUS
INTRAVENOUS | Status: DC | PRN
  Administered 2021-11-15: 70 mg via INTRAVENOUS
  Administered 2021-11-15: 30 mg via INTRAVENOUS

## 2021-11-15 MED ORDER — FENTANYL CITRATE (PF) 100 MCG/2ML IJ SOLN
INTRAMUSCULAR | Status: AC
  Administered 2021-11-15: 12:00:00 25 ug via INTRAVENOUS
  Filled 2021-11-15: qty 2

## 2021-11-15 MED ORDER — LABETALOL HCL 5 MG/ML IV SOLN
INTRAVENOUS | Status: AC
  Filled 2021-11-15: qty 4

## 2021-11-15 MED ORDER — FENTANYL CITRATE (PF) 250 MCG/5ML IJ SOLN
INTRAMUSCULAR | Status: DC | PRN
  Administered 2021-11-15: 25 ug via INTRAVENOUS
  Administered 2021-11-15: 225 ug via INTRAVENOUS

## 2021-11-15 MED ORDER — CEFAZOLIN SODIUM-DEXTROSE 2-4 GM/100ML-% IV SOLN
INTRAVENOUS | Status: AC
  Filled 2021-11-15: qty 100

## 2021-11-15 MED ORDER — ESMOLOL HCL 100 MG/10ML IV SOLN
INTRAVENOUS | Status: AC
  Filled 2021-11-15: qty 10

## 2021-11-15 MED ORDER — THROMBIN 20000 UNITS EX SOLR
CUTANEOUS | Status: AC
  Filled 2021-11-15: qty 20000

## 2021-11-15 MED ORDER — MIDAZOLAM HCL 2 MG/2ML IJ SOLN
INTRAMUSCULAR | Status: AC
  Filled 2021-11-15: qty 2

## 2021-11-15 SURGICAL SUPPLY — 96 items
APL SKNCLS STERI-STRIP NONHPOA (GAUZE/BANDAGES/DRESSINGS)
BAG COUNTER SPONGE SURGICOUNT (BAG) ×4 IMPLANT
BAG SPNG CNTER NS LX DISP (BAG) ×2
BAG SURGICOUNT SPONGE COUNTING (BAG) ×1
BAND INSRT 18 STRL LF DISP RB (MISCELLANEOUS)
BAND RUBBER #18 3X1/16 STRL (MISCELLANEOUS) IMPLANT
BENZOIN TINCTURE PRP APPL 2/3 (GAUZE/BANDAGES/DRESSINGS) IMPLANT
BLADE CLIPPER SURG (BLADE) ×5 IMPLANT
BLADE SAW GIGLI 16 STRL (MISCELLANEOUS) IMPLANT
BLADE SURG 15 STRL LF DISP TIS (BLADE) IMPLANT
BLADE SURG 15 STRL SS (BLADE)
BNDG CMPR 75X41 PLY HI ABS (GAUZE/BANDAGES/DRESSINGS)
BNDG GAUZE ELAST 4 BULKY (GAUZE/BANDAGES/DRESSINGS) IMPLANT
BNDG STRETCH 4X75 STRL LF (GAUZE/BANDAGES/DRESSINGS) IMPLANT
BUR ACORN 9.0 PRECISION (BURR) ×4 IMPLANT
BUR ACORN 9.0MM PRECISION (BURR) ×1
BUR ROUND FLUTED 4 SOFT TCH (BURR) IMPLANT
BUR ROUND FLUTED 4MM SOFT TCH (BURR)
BUR SPIRAL ROUTER 2.3 (BUR) ×4 IMPLANT
BUR SPIRAL ROUTER 2.3MM (BUR) ×1
CANISTER SUCT 3000ML PPV (MISCELLANEOUS) ×10 IMPLANT
CATH VENTRIC 35X38 W/TROCAR LG (CATHETERS) IMPLANT
CLIP VESOCCLUDE MED 6/CT (CLIP) IMPLANT
CNTNR URN SCR LID CUP LEK RST (MISCELLANEOUS) ×3 IMPLANT
CONT SPEC 4OZ STRL OR WHT (MISCELLANEOUS) ×4
COVER BURR HOLE UNIV 10 (Orthopedic Implant) ×2 IMPLANT
COVER MAYO STAND STRL (DRAPES) ×2 IMPLANT
DECANTER SPIKE VIAL GLASS SM (MISCELLANEOUS) ×3 IMPLANT
DRAIN SUBARACHNOID (WOUND CARE) IMPLANT
DRAPE HALF SHEET 40X57 (DRAPES) ×5 IMPLANT
DRAPE MICROSCOPE LEICA (MISCELLANEOUS) ×2 IMPLANT
DRAPE NEUROLOGICAL W/INCISE (DRAPES) ×5 IMPLANT
DRAPE STERI IOBAN 125X83 (DRAPES) IMPLANT
DRAPE SURG 17X23 STRL (DRAPES) IMPLANT
DRAPE WARM FLUID 44X44 (DRAPES) ×5 IMPLANT
DRSG ADAPTIC 3X8 NADH LF (GAUZE/BANDAGES/DRESSINGS) IMPLANT
DRSG TELFA 3X8 NADH (GAUZE/BANDAGES/DRESSINGS) IMPLANT
DURAPREP 6ML APPLICATOR 50/CS (WOUND CARE) ×5 IMPLANT
ELECT REM PT RETURN 9FT ADLT (ELECTROSURGICAL) ×4
ELECTRODE REM PT RTRN 9FT ADLT (ELECTROSURGICAL) ×3 IMPLANT
EVACUATOR 1/8 PVC DRAIN (DRAIN) IMPLANT
EVACUATOR SILICONE 100CC (DRAIN) IMPLANT
FORCEPS BIPOLAR SPETZLER 8 1.0 (NEUROSURGERY SUPPLIES) ×5 IMPLANT
GAUZE 4X4 16PLY ~~LOC~~+RFID DBL (SPONGE) IMPLANT
GAUZE SPONGE 4X4 12PLY STRL (GAUZE/BANDAGES/DRESSINGS) ×4 IMPLANT
GLOVE EXAM NITRILE LRG STRL (GLOVE) IMPLANT
GLOVE EXAM NITRILE XS STR PU (GLOVE) IMPLANT
GLOVE SURG ENC MOIS LTX SZ7 (GLOVE) IMPLANT
GLOVE SURG LTX SZ7.5 (GLOVE) ×5 IMPLANT
GLOVE SURG UNDER POLY LF SZ7 (GLOVE) IMPLANT
GLOVE SURG UNDER POLY LF SZ7.5 (GLOVE) ×5 IMPLANT
GOWN STRL REUS W/ TWL LRG LVL3 (GOWN DISPOSABLE) ×6 IMPLANT
GOWN STRL REUS W/ TWL XL LVL3 (GOWN DISPOSABLE) IMPLANT
GOWN STRL REUS W/TWL 2XL LVL3 (GOWN DISPOSABLE) IMPLANT
GOWN STRL REUS W/TWL LRG LVL3 (GOWN DISPOSABLE) ×8
GOWN STRL REUS W/TWL XL LVL3 (GOWN DISPOSABLE)
HEMOSTAT POWDER KIT SURGIFOAM (HEMOSTASIS) ×5 IMPLANT
HEMOSTAT SURGICEL 2X14 (HEMOSTASIS) ×5 IMPLANT
IV NS 1000ML (IV SOLUTION) ×4
IV NS 1000ML BAXH (IV SOLUTION) ×3 IMPLANT
KIT BASIN OR (CUSTOM PROCEDURE TRAY) ×5 IMPLANT
KIT DRAIN CSF ACCUDRAIN (MISCELLANEOUS) IMPLANT
KIT TURNOVER KIT B (KITS) ×5 IMPLANT
MARKER SPHERE PSV REFLC 13MM (MARKER) ×15 IMPLANT
NDL SPNL 18GX3.5 QUINCKE PK (NEEDLE) IMPLANT
NEEDLE HYPO 22GX1.5 SAFETY (NEEDLE) ×5 IMPLANT
NEEDLE SPNL 18GX3.5 QUINCKE PK (NEEDLE) IMPLANT
NS IRRIG 1000ML POUR BTL (IV SOLUTION) ×13 IMPLANT
PACK CRANIOTOMY CUSTOM (CUSTOM PROCEDURE TRAY) ×5 IMPLANT
PAD DRESSING TELFA 3X8 NADH (GAUZE/BANDAGES/DRESSINGS) IMPLANT
PATTIES SURGICAL .25X.25 (GAUZE/BANDAGES/DRESSINGS) IMPLANT
PATTIES SURGICAL .5 X.5 (GAUZE/BANDAGES/DRESSINGS) ×2 IMPLANT
PATTIES SURGICAL .5 X3 (DISPOSABLE) ×2 IMPLANT
PATTIES SURGICAL 1/4 X 3 (GAUZE/BANDAGES/DRESSINGS) IMPLANT
PATTIES SURGICAL 1X1 (DISPOSABLE) ×2 IMPLANT
PIN MAYFIELD SKULL DISP (PIN) ×5 IMPLANT
SCREW UNIII AXS SD 1.5X4 (Screw) ×6 IMPLANT
SPECIMEN JAR SMALL (MISCELLANEOUS) IMPLANT
SPONGE NEURO XRAY DETECT 1X3 (DISPOSABLE) ×2 IMPLANT
SPONGE SURGIFOAM ABS GEL 100 (HEMOSTASIS) ×5 IMPLANT
STAPLER VISISTAT 35W (STAPLE) ×5 IMPLANT
SUT ETHILON 3 0 FSL (SUTURE) IMPLANT
SUT ETHILON 3 0 PS 1 (SUTURE) IMPLANT
SUT MNCRL AB 3-0 PS2 18 (SUTURE) IMPLANT
SUT MON AB 3-0 SH 27 (SUTURE)
SUT MON AB 3-0 SH27 (SUTURE) IMPLANT
SUT NURALON 4 0 TR CR/8 (SUTURE) ×15 IMPLANT
SUT SILK 0 TIES 10X30 (SUTURE) IMPLANT
SUT VIC AB 2-0 CP2 18 (SUTURE) ×7 IMPLANT
TOWEL GREEN STERILE (TOWEL DISPOSABLE) ×5 IMPLANT
TOWEL GREEN STERILE FF (TOWEL DISPOSABLE) ×5 IMPLANT
TRAY FOLEY MTR SLVR 16FR STAT (SET/KITS/TRAYS/PACK) ×5 IMPLANT
TUBE CONNECTING 12'X1/4 (SUCTIONS) ×1
TUBE CONNECTING 12X1/4 (SUCTIONS) ×4 IMPLANT
UNDERPAD 30X36 HEAVY ABSORB (UNDERPADS AND DIAPERS) ×5 IMPLANT
WATER STERILE IRR 1000ML POUR (IV SOLUTION) ×5 IMPLANT

## 2021-11-15 NOTE — Progress Notes (Signed)
Neurosurgery Service Post-operative progress note  Assessment & Plan: 55 y.o. woman s/p retrosig for hemorrhagic tumor, seen in PACU, awake/alert, Fcx4, Cns grossly intact except baseline hearing loss.  -hyponatremic, likely worsened by pseudohyponatremia from mannitol intra-op, will start cautiously with 3% at 25cc/hr as mannitol is diuresed. Q2h Na while infusing, will increase if not correcting -MRI w/wo contrast when able  -return to [place]  11/15/21 11:09 AM

## 2021-11-15 NOTE — Anesthesia Postprocedure Evaluation (Signed)
Anesthesia Post Note  Patient: Tanya Jordan  Procedure(s) Performed: Right craniotomy for tumor resection with brainlab (Right) APPLICATION OF CRANIAL NAVIGATION     Patient location during evaluation: PACU Anesthesia Type: General Level of consciousness: sedated, patient cooperative and oriented Pain management: pain level controlled Vital Signs Assessment: post-procedure vital signs reviewed and stable Respiratory status: spontaneous breathing, nonlabored ventilation, respiratory function stable and patient connected to nasal cannula oxygen Cardiovascular status: blood pressure returned to baseline and stable Postop Assessment: no apparent nausea or vomiting Anesthetic complications: no   No notable events documented.  Last Vitals:  Vitals:   11/15/21 1115 11/15/21 1130  BP:    Pulse: 65 (!) 57  Resp: (!) 21 17  Temp:    SpO2: 96% 97%    Last Pain:  Vitals:   11/15/21 0600  TempSrc:   PainSc: Asleep                 Vergie Zahm,E. Krystena Reitter

## 2021-11-15 NOTE — Op Note (Signed)
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 Given the hemorrhage, I recommended craniotomy and resection. This was discussed with the patient as well as risks, benefits, and alternatives and wished to proceed with surgery.   OPERATIVE DETAIL: The patient was taken to the operating room and placed on the OR table in the supine position. A formal time out was performed with two patient identifiers and confirmed the operative site. Anesthesia was induced by the anesthesia team. The [place] head holder was applied to the head and a registration array was attached to the [place]. The patient's head was positioned turned to the left to bring the right mastoid to the highest point in the field. This was co-registered with the patient's preoperative imaging, the fit appeared to be acceptable. Using frameless stereotaxy, the operative trajectory was planned and the incision was marked. Hair was clipped with surgical clippers over the incision and the area was then prepped and draped in a sterile fashion.  A linear incision was placed in the right retrosigmoid region. Soft tissues were dissected and retracted. A standard retrosigmoid craniotomy was created with a high speed drill and rongeurs. The dura was incised, flapped, and secured with suture. Immediately upon opening the dura, dark blood produced itself through the cortex under high pressure. I  drained this and decompressed the hematoma, then started dissecting around the tumor with a combination of visual references and stereotactic navigation. On preop scans, the superior petrosal vein appeared attached to the mass, and indeed it was in vivo. I therefore coagulated and divided it to free the ventral tumor capsule. The borders were dissected and the walls of the hematoma, which were consistent with very vascular tumor, were resected and all pieces were sent to pathology. Borders were checked with navigation as well as visually and no remaining tumor was visible. Hemostasis was obtained and confirmed, the wound was copiously irrigated, a titanium plate was secured over the craniectomy defect with titanium screws, all instrument and sponge counts were correct, the incision was then closed in layers. The patient was then returned to anesthesia for emergence. No apparent complications at the completion of the procedure.   EBL:  249mL   DRAINS: none   SPECIMENS: Right cerebellar hemorrhagic mass   Judith Part, MD 11/15/21 7:17 AM

## 2021-11-15 NOTE — Progress Notes (Signed)
Neurosurgery Service Progress Note  Subjective: No acute events overnight, cont'd headaches, no new N/V   Objective: Vitals:   11/15/21 0500 11/15/21 0530 11/15/21 0600 11/15/21 0700  BP: (!) 168/81  (!) 152/70 (!) 164/83  Pulse: 66 (!) 54 (!) 56 (!) 58  Resp:      Temp:      TempSrc:      SpO2: 96% 97% 96% 99%  Weight:      Height:        Physical Exam: Strength 5/5 x4, SILTx4, +R appendicular ataxia  Assessment & Plan: 55 y.o. 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  -CT brainlab protocol looks good for registration -OR today, will send path  -4N ICU post-op  Judith Part  11/15/21 8:01 AM

## 2021-11-15 NOTE — Anesthesia Procedure Notes (Signed)
Procedure Name: Intubation Date/Time: 11/15/2021 8:41 AM Performed by: Renato Shin, CRNA Pre-anesthesia Checklist: Patient identified, Emergency Drugs available, Suction available and Patient being monitored Patient Re-evaluated:Patient Re-evaluated prior to induction Oxygen Delivery Method: Circle system utilized Preoxygenation: Pre-oxygenation with 100% oxygen Induction Type: IV induction Ventilation: Mask ventilation without difficulty Laryngoscope Size: Mac and 3 Grade View: Grade I Tube type: Oral Tube size: 7.0 mm Number of attempts: 1 Airway Equipment and Method: Stylet and Oral airway Placement Confirmation: ETT inserted through vocal cords under direct vision, positive ETCO2 and breath sounds checked- equal and bilateral Tube secured with: Tape Dental Injury: Teeth and Oropharynx as per pre-operative assessment

## 2021-11-15 NOTE — Anesthesia Procedure Notes (Signed)
Arterial Line Insertion Start/End12/14/2022 8:00 AM, 11/15/2021 8:05 AM Performed by: CRNA  Patient location: Pre-op. Preanesthetic checklist: patient identified, IV checked, site marked, risks and benefits discussed, surgical consent, monitors and equipment checked, pre-op evaluation, timeout performed and anesthesia consent Lidocaine 1% used for infiltration Left, radial was placed Catheter size: 20 G Hand hygiene performed , maximum sterile barriers used  and Seldinger technique used Allen's test indicative of satisfactory collateral circulation Attempts: 1 Procedure performed without using ultrasound guided technique. Following insertion, dressing applied and Biopatch. Post procedure assessment: normal  Patient tolerated the procedure well with no immediate complications.

## 2021-11-15 NOTE — Transfer of Care (Signed)
Immediate Anesthesia Transfer of Care Note  Patient: Tanya Jordan  Procedure(s) Performed: Right craniotomy for tumor resection with brainlab (Right) APPLICATION OF CRANIAL NAVIGATION  Patient Location: PACU  Anesthesia Type:General  Level of Consciousness: awake, drowsy and patient cooperative  Airway & Oxygen Therapy: Patient Spontanous Breathing and Patient connected to face mask oxygen  Post-op Assessment: Report given to RN, Post -op Vital signs reviewed and stable, Patient moving all extremities X 4 and Patient able to stick tongue midline  Post vital signs: Reviewed and stable  Last Vitals:  Vitals Value Taken Time  BP 100/33 11/15/21 1112  Temp    Pulse 55 11/15/21 1116  Resp 14 11/15/21 1116  SpO2 99 % 11/15/21 1116  Vitals shown include unvalidated device data.  Last Pain:  Vitals:   11/15/21 0600  TempSrc:   PainSc: Asleep         Complications: No notable events documented.

## 2021-11-16 ENCOUNTER — Inpatient Hospital Stay (HOSPITAL_COMMUNITY): Payer: 59

## 2021-11-16 ENCOUNTER — Other Ambulatory Visit: Payer: Self-pay | Admitting: Radiation Therapy

## 2021-11-16 ENCOUNTER — Encounter (HOSPITAL_COMMUNITY): Payer: Self-pay | Admitting: Neurological Surgery

## 2021-11-16 LAB — SODIUM
Sodium: 133 mmol/L — ABNORMAL LOW (ref 135–145)
Sodium: 135 mmol/L (ref 135–145)

## 2021-11-16 LAB — GLUCOSE, CAPILLARY: Glucose-Capillary: 99 mg/dL (ref 70–99)

## 2021-11-16 MED ORDER — GADOBUTROL 1 MMOL/ML IV SOLN
5.5000 mL | Freq: Once | INTRAVENOUS | Status: AC | PRN
  Administered 2021-11-16: 5.5 mL via INTRAVENOUS

## 2021-11-16 NOTE — Evaluation (Signed)
Occupational Therapy Evaluation Patient Details Name: Tanya Jordan MRN: [id number] DOB: 08-17-1966 Today's Date: 11/16/2021   History of Present Illness 55 y.o. 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 12/14 s/p craniotomy for tumor resection, 12/15 MRI w/ GTR   Clinical Impression   PTA pt lives with family, drives and works full-time at [employer]. Pt complains of dizziness and "blurry" vision and demonstrates apparent deficits with her balance, decreasing her independence with ADL/IADL tasks and increasing her risk of falls. Recommend follow up with OT at the Neuro outpt Clinic. Will follow in acute care.     Recommendations for follow up therapy are one component of a multi-disciplinary discharge planning process, led by the attending physician.  Recommendations may be updated based on patient status, additional functional criteria and insurance authorization.   Follow Up Recommendations  Outpatient OT (neuro outpt)    Assistance Recommended at Discharge Intermittent Supervision/Assistance  Functional Status Assessment  Patient has had a recent decline in their functional status and demonstrates the ability to make significant improvements in function in a reasonable and predictable amount of time.  Equipment Recommendations  BSC/3in1 (to use as a shower seat - pt unsure if she has a seat at her house)    Recommendations for Other Services       Precautions / Restrictions Precautions Precautions: Fall Precaution Comments: pt with dizziness with movement      Mobility Bed Mobility Overal bed mobility: Modified Independent                  Transfers Overall transfer level: Needs assistance Equipment used: 1 person hand held assist Transfers: Sit to/from Stand Sit to Stand: Min assist           General transfer comment: unsteady  with LOB      Balance Overall balance assessment: Needs assistance;History of Falls   Sitting balance-Leahy Scale: Good       Standing balance-Leahy Scale: Poor (LOB several times requiring assistance) Standing balance comment: fall PTA                           ADL either performed or assessed with clinical judgement   ADL Overall ADL's : Needs assistance/impaired Eating/Feeding: Modified independent   Grooming: Set up;Supervision/safety;Sitting   Upper Body Bathing: Set up;Supervision/ safety;Sitting   Lower Body Bathing: Min guard;Sit to/from stand   Upper Body Dressing : Set up;Supervision/safety;Sitting   Lower Body Dressing: Min guard;Sit to/from stand   Toilet Transfer: Minimal assistance;Ambulation (unsteady)   Toileting- Clothing Manipulation and Hygiene: Min guard       Functional mobility during ADLs: Minimal assistance       Vision Baseline Vision/History: 1 Wears glasses Ability to See in Adequate Light: 1 Impaired Patient Visual Report: Blurring of vision Vision Assessment?: Yes Eye Alignment: Within Functional Limits Ocular Range of Motion: Within Functional Limits Alignment/Gaze Preference: Within Defined Limits Tracking/Visual Pursuits: Decreased smoothness of horizontal tracking;Decreased smoothness of vertical tracking Saccades: Additional eye shifts occurred during testing;Decreased speed of saccadic movement (+nystagmus; worse with R gaze) Visual Fields: No apparent deficits Depth Perception: Overshoots Additional Comments: unable to read phone with normal font size; font size maximized and pt able to use cell phone     Perception     Praxis      Pertinent Vitals/Pain Pain Assessment: 0-10 Pain  Score: 5  Pain Location: headache Pain Descriptors / Indicators: Headache Pain Intervention(s): Limited activity within patient's tolerance     Hand Dominance Right   Extremity/Trunk Assessment Upper Extremity Assessment Upper  Extremity Assessment: Overall WFL for tasks assessed   Lower Extremity Assessment Lower Extremity Assessment: Defer to PT evaluation   Cervical / Trunk Assessment Cervical / Trunk Assessment: Normal   Communication Communication Communication: HOH (extremely HOH)   Cognition Arousal/Alertness: Awake/alert Behavior During Therapy: Flat affect;WFL for tasks assessed/performed Overall Cognitive Status: Within Functional Limits for tasks assessed (will further assess)                                 General Comments: pt extremely HOH, delayed response time occasionally but appropriate, followed all commands     General Comments       Exercises     Shoulder Instructions      Home Living Family/patient expects to be discharged to:: Private residence Living Arrangements: Children (dtr and grandson - 47yo) Available Help at Discharge: Family;Available PRN/intermittently (dtr works but states she could figure out 24/7 assist if needed) Type of Home: House Home Access: Stairs to enter Technical brewer of Steps: 3 Entrance Stairs-Rails: Left Home Layout: One level     Bathroom Shower/Tub: Teacher, early years/pre: Standard     Home Equipment: None          Prior Functioning/Environment Prior Level of Function : Independent/Modified Independent             Mobility Comments: indep, works at the [employer]; drives ADLs Comments: no        OT Problem List: Decreased activity tolerance;Impaired balance (sitting and/or standing);Pain;Decreased knowledge of precautions;Impaired vision/perception      OT Treatment/Interventions: Self-care/ADL training    OT Goals(Current goals can be found in the care plan section) Acute Rehab OT Goals Patient Stated Goal: to get better OT Goal Formulation: With patient Time For Goal Achievement: 11/30/21 Potential to Achieve Goals: Good  OT Frequency: Min 2X/week   Barriers to D/C:             Co-evaluation              AM-PAC OT "6 Clicks" Daily Activity     Outcome Measure Help from another person eating meals?: A Little Help from another person taking care of personal grooming?: A Little Help from another person toileting, which includes using toliet, bedpan, or urinal?: A Little Help from another person bathing (including washing, rinsing, drying)?: A Little Help from another person to put on and taking off regular upper body clothing?: A Little Help from another person to put on and taking off regular lower body clothing?: A Little 6 Click Score: 18   End of Session Equipment Utilized During Treatment: Gait belt Nurse Communication: Mobility status;Other (comment) (DC needs)  Activity Tolerance: Patient tolerated treatment well Patient left: in chair;with call bell/phone within reach;with chair alarm set  OT Visit Diagnosis: Unsteadiness on feet (R26.81);Other abnormalities of gait and mobility (R26.89);History of falling (Z91.81);Low vision, both eyes (H54.2);Other symptoms and signs involving the nervous system (R29.898);Dizziness and giddiness (R42);Pain Pain - part of body:  (Headache)                Time: 7425-9563 OT Time Calculation (min): 30 min Charges:  OT General Charges $OT Visit: 1 Visit OT Evaluation $OT Eval Moderate Complexity: 1 Mod OT Treatments $  Self Care/Home Management : 8-22 mins  Maurie Boettcher, OT/L   Acute OT Clinical Specialist Acute Rehabilitation Services Pager [phone] Office [phone]   [facility] 11/16/2021, 1:36 PM

## 2021-11-16 NOTE — Progress Notes (Signed)
Na 135, 3% stopped per neurosurgery.

## 2021-11-16 NOTE — Discharge Summary (Signed)
Discharge Summary  Date of Admission: 11/13/2021  Date of Discharge: 11/19/21  Attending Physician: Emelda Brothers, MD  Hospital Course: Patient was transferred from an OSH due to a new right cerebellar hemorrhage. 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 After arrival, MRI was performed which was suggestive of underlying tumor with continued expansion of the hemorrhage on repeat imaging. She was taken to the OR 12/14 for a retrosigmoid craniotomy and tumor resection, tolerated well, post-op MRI showed gross total resection. 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 Her hospital course was otherwise uncomplicated and the patient was discharged home on 11/19/21. She will follow up in clinic with me in 2 weeks. 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   Neurologic exam at discharge:  AOx3, PERRL, EOMI, FS, TM Strength 5/5 x4, SILTx4, +R appendicular ataxia  Discharge diagnosis: Hemorrhagic metastatic brain tumor  Judith Part, MD 11/16/21 9:05 AM

## 2021-11-16 NOTE — Progress Notes (Signed)
Physical Therapy Re-Evaluation & Treatment Patient Details Name: Tanya Jordan MRN: [id number] DOB: 12/16/1965 Today's Date: 11/16/2021   History of Present Illness 55 y.o. 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 12/14 s/p craniotomy for tumor resection, 12/15 MRI w/ GTR    PT Comments    Patient seen for re-evaluation following R crani for tumor resection. Patient demonstrates balance deficits putting her at increased risk for falls. Patient requires minA for ambulation with no AD with dynamic or dual tasks. Educated patient and mother about need for supervision/assistance at discharge for safety due to balance deficits, patient and mother verbalized understanding. Patient will benefit from skilled PT services during acute stay to address listed deficits. Recommend OPPT at discharge to address balance deficits.     Recommendations for follow up therapy are one component of a multi-disciplinary discharge planning process, led by the attending physician.  Recommendations may be updated based on patient status, additional functional criteria and insurance authorization.  Follow Up Recommendations  Outpatient PT (neuro OPPT)     Assistance Recommended at Discharge Frequent or constant Supervision/Assistance  Equipment Recommendations  Rolling Providencia Hottenstein (2 wheels)    Recommendations for Other Services       Precautions / Restrictions Precautions Precautions: Fall Precaution Comments: pt with dizziness with movement     Mobility  Bed Mobility Overal bed mobility: Modified Independent             General bed mobility comments: in recliner on arrival    Transfers Overall transfer level: Needs assistance Equipment used: None Transfers: Sit to/from Stand Sit to Stand: Min assist           General transfer comment: initial posterior LOB upon standing  requiring minA to recover    Ambulation/Gait Ambulation/Gait assistance: Min assist Gait Distance (Feet): 150 Feet Assistive device: None Gait Pattern/deviations: Step-through pattern;Decreased stride length;Drifts right/left Gait velocity: decreased     General Gait Details: min guard initially with focused straight path. MinA with addition of talking, head turns, and patient pointing at objects.   Stairs             Wheelchair Mobility    Modified Rankin (Stroke Patients Only)       Balance Overall balance assessment: Needs assistance;History of Falls   Sitting balance-Leahy Scale: Good     Standing balance support: No upper extremity supported;During functional activity Standing balance-Leahy Scale: Poor Standing balance comment: balance deficits with dynamic or dual tasking                            Cognition Arousal/Alertness: Awake/alert Behavior During Therapy: Flat affect;WFL for tasks assessed/performed Overall Cognitive Status: Within Functional Limits for tasks assessed                                 General Comments: extremely HOH. Delayed responses at times. Uses laughter and joking when she doesn't hear what you are saying        Exercises      General Comments        Pertinent Vitals/Pain Pain Assessment: 0-10 Pain Score: 5  Pain Location: headache Pain Descriptors / Indicators: Headache Pain Intervention(s): Limited activity within patient's tolerance    Home Living Family/patient expects to be discharged to:: Private residence  Living Arrangements: Children (dtr and grandson - 26yo) Available Help at Discharge: Family;Available PRN/intermittently (dtr works but states she could figure out 24/7 assist if needed) Type of Home: House Home Access: Stairs to enter Entrance Stairs-Rails: Horticulturist, commercial of Steps: 3   Home Layout: One level Home Equipment: None      Prior Function             PT Goals (current goals can now be found in the care plan section) Acute Rehab PT Goals PT Goal Formulation: With patient Time For Goal Achievement: 12/02/2021 Potential to Achieve Goals: Good Progress towards PT goals: Progressing toward goals    Frequency    Min 4X/week      PT Plan Discharge plan needs to be updated    Co-evaluation              AM-PAC PT "6 Clicks" Mobility   Outcome Measure  Help needed turning from your back to your side while in a flat bed without using bedrails?: None Help needed moving from lying on your back to sitting on the side of a flat bed without using bedrails?: None Help needed moving to and from a bed to a chair (including a wheelchair)?: A Little Help needed standing up from a chair using your arms (e.g., wheelchair or bedside chair)?: A Little Help needed to walk in hospital room?: A Little Help needed climbing 3-5 steps with a railing? : A Little 6 Click Score: 20    End of Session Equipment Utilized During Treatment: Gait belt Activity Tolerance: Patient tolerated treatment well Patient left: in chair;with call bell/phone within reach;with chair alarm set;with family/visitor present Nurse Communication: Mobility status PT Visit Diagnosis: Muscle weakness (generalized) (M62.81);Difficulty in walking, not elsewhere classified (R26.2)     Time: 1133-1150 PT Time Calculation (min) (ACUTE ONLY): 17 min  Charges:                        Annet Manukyan A. Gilford Rile PT, DPT Acute Rehabilitation Services Pager [phone] Office [phone]    Linna Hoff 11/16/2021, 2:06 PM

## 2021-11-16 NOTE — Progress Notes (Addendum)
Neurosurgery Service Progress Note  Subjective: No acute events overnight, headaches improved from preop, some blurry vision but no diplopia, she states this feels like her baseline visual acuity  Objective: Vitals:   11/16/21 0500 11/16/21 0600 11/16/21 0750 11/16/21 0800  BP:  128/63  (!) 112/58  Pulse: 73 (!) 176  65  Resp: 16 17  20   Temp:    99 F (37.2 C)  TempSrc:    Oral  SpO2: 95% 98% 100% 100%  Weight:      Height:        Physical Exam: Aox3, PERRL, EOMI with no diplopia at extreme gaze but some nystagmus on far right gaze, strength 5/5 x4, SILTx4, +R appendicular ataxia  Assessment & Plan: 55 y.o. 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

## 2021-11-17 LAB — RENAL FUNCTION PANEL
Albumin: 2.7 g/dL — ABNORMAL LOW (ref 3.5–5.0)
Anion gap: 7 (ref 5–15)
BUN: 9 mg/dL (ref 6–20)
CO2: 26 mmol/L (ref 22–32)
Calcium: 8.1 mg/dL — ABNORMAL LOW (ref 8.9–10.3)
Chloride: 99 mmol/L (ref 98–111)
Creatinine, Ser: 0.62 mg/dL (ref 0.44–1.00)
GFR, Estimated: >60 mL/min (ref >60)
Glucose, Bld: 92 mg/dL (ref 70–99)
Phosphorus: 2.9 mg/dL (ref 2.5–4.6)
Potassium: 2.8 mmol/L — ABNORMAL LOW (ref 3.5–5.1)
Sodium: 132 mmol/L — ABNORMAL LOW (ref 135–145)

## 2021-11-17 LAB — GLUCOSE, CAPILLARY
Glucose-Capillary: 94 mg/dL (ref 70–99)
Glucose-Capillary: 112 mg/dL — ABNORMAL HIGH (ref 70–99)

## 2021-11-17 LAB — SURGICAL PATHOLOGY

## 2021-11-17 MED ORDER — HEPARIN SODIUM (PORCINE) 5000 UNIT/ML IJ SOLN
5000.0000 [IU] | Freq: Three times a day (TID) | INTRAMUSCULAR | Status: DC
  Administered 2021-11-17 – 2021-11-21 (×14): 5000 [IU] via SUBCUTANEOUS
  Filled 2021-11-17 (×14): qty 1

## 2021-11-17 NOTE — Progress Notes (Signed)
° °  Providing Compassionate, Quality Care - Together  NEUROSURGERY PROGRESS NOTE   S: No issues overnight. Pt unsure if she is ready for dc today  O: EXAM:  BP 122/68 (BP Location: Right Arm)    Pulse 82    Temp 99.6 F (37.6 C) (Oral)    Resp 20    Ht 5\' 4"  (1.626 m)    Wt 54.1 kg    LMP 03/29/2011    SpO2 95%    BMI 20.47 kg/m   Awake, alert, oriented  PERRL Speech fluent, appropriate  CNs grossly intact  5/5 BUE/BLE   ASSESSMENT:  55 y.o. 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 -12/14 s/p craniotomy for tumor resection,    PLAN: - am labs stable - dc planning -HH ordered -stable for dc  -sqh    Thank you for allowing me to participate in this patient's care.  Please do not hesitate to call with questions or concerns.   Elwin Sleight, [facility] Cell: [phone]

## 2021-11-17 NOTE — Progress Notes (Signed)
Physical Therapy Treatment Patient Details Name: Tanya Jordan MRN: [id number] DOB: [birth year] Today's Date: 11/17/2021   History of Present Illness 55 y.o. 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 12/14 s/p craniotomy for tumor resection, 12/15 MRI w/ GTR    PT Comments    Patient progressing with balance and able to go up stairs.  She is appropriate for home with family support.  Discussed follow up PT which may not occur since she will be without insurance after end of the year, but encouraged family to follow up if needed for financial assistance and outpatient referral.  PT will follow up if not d/c.   Recommendations for follow up therapy are one component of a multi-disciplinary discharge planning process, led by the attending physician.  Recommendations may be updated based on patient status, additional functional criteria and insurance authorization.  Follow Up Recommendations  Outpatient PT     Assistance Recommended at Discharge Frequent or constant Supervision/Assistance  Equipment Recommendations  Rolling walker (2 wheels)    Recommendations for Other Services       Precautions / Restrictions Precautions Precautions: Fall Precaution Comments: pt with dizziness with movement     Mobility  Bed Mobility Overal bed mobility: Modified Independent                  Transfers   Equipment used: None Transfers: Sit to/from Stand Sit to Stand: Min guard           General transfer comment: minguard for balance/safety    Ambulation/Gait Ambulation/Gait assistance: Min guard Gait Distance (Feet): 200 Feet Assistive device: None Gait Pattern/deviations: Step-through pattern;Decreased stride length;Drifts right/left       General Gait Details: minguard throughout with occasional veering, but not running into obstacles or with  LOB   Stairs Stairs: Yes Stairs assistance: Supervision Stair Management: Alternating pattern;Forwards;One rail Left;Step to pattern Number of Stairs: 2 (x 2) General stair comments: S for safety, stable with rail; alternating to ascend   Wheelchair Mobility    Modified Rankin (Stroke Patients Only) Modified Rankin (Stroke Patients Only) Pre-Morbid Rankin Score: No symptoms Modified Rankin: Moderate disability     Balance Overall balance assessment: Needs assistance   Sitting balance-Leahy Scale: Good       Standing balance-Leahy Scale: Good Standing balance comment: able to perform step taps to 6" step alternating without UE support one LOB after about 5 reps with min A to recover               High Level Balance Comments: feet together, eyes closed with posterior LOB after about 5 seconds, min A recovery            Cognition Arousal/Alertness: Awake/alert Behavior During Therapy: WFL for tasks assessed/performed Overall Cognitive Status: Within Functional Limits for tasks assessed                                          Exercises      General Comments General comments (skin integrity, edema, etc.): daughter and mother in the room, report insurance to expire on Dec 31 so may not get follow up therapy; discussed may improve on her own with  more mobility at d/c, but if not to follow up as can apply for financial assistance.  Pertinent Vitals/Pain Pain Score: 7  Pain Location: headache Pain Descriptors / Indicators: Headache Pain Intervention(s): Monitored during session;Repositioned    Home Living                          Prior Function            PT Goals (current goals can now be found in the care plan section) Progress towards PT goals: Progressing toward goals    Frequency    Min 4X/week      PT Plan Current plan remains appropriate    Co-evaluation              AM-PAC PT "6 Clicks" Mobility    Outcome Measure  Help needed turning from your back to your side while in a flat bed without using bedrails?: None Help needed moving from lying on your back to sitting on the side of a flat bed without using bedrails?: None Help needed moving to and from a bed to a chair (including a wheelchair)?: A Little Help needed standing up from a chair using your arms (e.g., wheelchair or bedside chair)?: A Little Help needed to walk in hospital room?: A Little Help needed climbing 3-5 steps with a railing? : A Little 6 Click Score: 20    End of Session Equipment Utilized During Treatment: Gait belt Activity Tolerance: Patient tolerated treatment well Patient left: in bed;with call bell/phone within reach;with bed alarm set;with family/visitor present   PT Visit Diagnosis: Muscle weakness (generalized) (M62.81);Difficulty in walking, not elsewhere classified (R26.2)     Time: 4287-6811 PT Time Calculation (min) (ACUTE ONLY): 20 min  Charges:  $Gait Training: 8-22 mins                     Magda Kiel, PT Acute Rehabilitation Services Pager:[phone] Office:[phone] 11/17/2021    Reginia Naas 11/17/2021, 1:12 PM

## 2021-11-17 NOTE — Progress Notes (Signed)
Occupational Therapy Treatment Patient Details Name: SYONA WROBLEWSKI MRN: [id number] DOB: 11/11/1966 Today's Date: 11/17/2021   History of present illness 55 y.o. 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 12/14 s/p craniotomy for tumor resection, 12/15 MRI w/ GTR   OT comments  Pt is able to complete ADLs with min guard assist.  She continues with nystagmus and reports diplopia with Rt gaze.  Recommend OPOT.    Recommendations for follow up therapy are one component of a multi-disciplinary discharge planning process, led by the attending physician.  Recommendations may be updated based on patient status, additional functional criteria and insurance authorization.    Follow Up Recommendations  Outpatient OT    Assistance Recommended at Discharge Intermittent Supervision/Assistance  Equipment Recommendations  BSC/3in1    Recommendations for Other Services      Precautions / Restrictions Precautions Precautions: Fall Precaution Comments: pt with dizziness with movement       Mobility Bed Mobility Overal bed mobility: Modified Independent                  Transfers   Equipment used: None Transfers: Sit to/from Stand Sit to Stand: Min guard           General transfer comment: minguard for balance/safety     Balance Overall balance assessment: Needs assistance   Sitting balance-Leahy Scale: Good     Standing balance support: No upper extremity supported;During functional activity Standing balance-Leahy Scale: Good Standing balance comment: able to perform step taps to 6" step alternating without UE support one LOB after about 5 reps with min A to recover               High Level Balance Comments: feet together, eyes closed with posterior LOB after about 5 seconds, min A recovery           ADL either performed or assessed with  clinical judgement   ADL Overall ADL's : Needs assistance/impaired                         Toilet Transfer: Min guard;Ambulation;Comfort height toilet;Grab bars   Toileting- Clothing Manipulation and Hygiene: Min guard   Tub/ Banker: Tub transfer;Min Building surveyor Details (indicate cue type and reason): pt instructed in safety with tub transfer using 3in1 Functional mobility during ADLs: Min guard      Extremity/Trunk Assessment Upper Extremity Assessment Upper Extremity Assessment: Overall WFL for tasks assessed   Lower Extremity Assessment Lower Extremity Assessment: Defer to PT evaluation        Vision   Vision Assessment?: Yes Additional Comments: Pt inconsistent with responses re: her vision.  She demonstrates nystagumus with bil. gaze.  She closes Rt eye when looking to the Rt.  but denies diplopia with central or Lt gaze.  She initially stated she can't read, but when pressed to do so, she was able to read medium font on her menu, and reports she has been texting   Perception     Praxis      Cognition Arousal/Alertness: Awake/alert Behavior During Therapy: WFL for tasks assessed/performed Overall Cognitive Status: Within Functional Limits for tasks assessed                                 General Comments: Pt  very [facility]          Exercises     Shoulder Instructions       General Comments daughter and mother in the room, report insurance to expire on Dec 31 so may not get follow up therapy; discussed may improve on her own with  more mobility at d/c, but if not to follow up as can apply for financial assistance.    Pertinent Vitals/ Pain       Pain Assessment: No/denies pain Pain Score: 7  Pain Location: headache Pain Descriptors / Indicators: Headache Pain Intervention(s): Monitored during session;Repositioned  Home Living                                          Prior  Functioning/Environment              Frequency  Min 2X/week        Progress Toward Goals  OT Goals(current goals can now be found in the care plan section)  Progress towards OT goals: Progressing toward goals     Plan Discharge plan remains appropriate    Co-evaluation                 AM-PAC OT "6 Clicks" Daily Activity     Outcome Measure   Help from another person eating meals?: None Help from another person taking care of personal grooming?: A Little Help from another person toileting, which includes using toliet, bedpan, or urinal?: A Little Help from another person bathing (including washing, rinsing, drying)?: A Little Help from another person to put on and taking off regular upper body clothing?: A Little Help from another person to put on and taking off regular lower body clothing?: A Little 6 Click Score: 19    End of Session    OT Visit Diagnosis: Unsteadiness on feet (R26.81);Other abnormalities of gait and mobility (R26.89);History of falling (Z91.81);Low vision, both eyes (H54.2);Other symptoms and signs involving the nervous system (R29.898);Dizziness and giddiness (R42);Pain   Activity Tolerance Patient tolerated treatment well   Patient Left in bed;with call bell/phone within reach;with bed alarm set   Nurse Communication Mobility status        Time: 8676-1950 OT Time Calculation (min): 34 min  Charges: OT General Charges $OT Visit: 1 Visit OT Treatments $Self Care/Home Management : 23-37 mins  Nilsa Nutting OTR/L Acute Rehabilitation Services Pager [phone] Office [phone]   Lucille Passy M 11/17/2021, 4:30 PM

## 2021-11-17 NOTE — TOC Initial Note (Signed)
Transition of Care [facility]) - Initial/Assessment Note    Patient Details  Name: Tanya Jordan MRN: [id number] Date of Birth: [birth year]  Transition of [facility] CM/SW Contact:    Pollie Friar, RN Phone Number: 11/17/2021, 4:49 PM  Clinical Narrative:                 Patient is from home with daughter but daughter works in the pm. Daughter concerned about pts dizziness when oob. CM sent MD a message and text. Bedside RN to follow up.  Daughter agreeable to home health. [facility] accepted as long as pt has an in [employer] after [place] terms Dec 31st. Daughter to call CM with the new insurance.  Daughter to provide transport home when pt discharged.   Expected Discharge Plan: [place] Barriers to Discharge: Continued Medical Work up   Patient Goals and CMS Choice   CMS Medicare.gov Compare Post Acute Care list provided to:: Patient Represenative (must comment) Choice offered to / list presented to : Adult Children  Expected Discharge Plan and Services Expected Discharge Plan: [place]   Discharge Planning Services: CM Consult Post Acute Care Choice: Home Health                             HH Arranged: PT, OT, RN [facility] Agency: [place] Date [facility] Agency Contacted: 11/17/21   Representative spoke with at [place]: Erline Levine  Prior Living Arrangements/Services   Lives with:: Adult Children Patient language and need for interpreter reviewed:: Yes Do you feel safe going back to the place where you live?: Yes          Current home services: DME (walker/ 3 in 1) Criminal Activity/Legal Involvement Pertinent to Current Situation/Hospitalization: No - Comment as needed  Activities of Daily Living      Permission Sought/Granted                  Emotional Assessment Appearance:: Appears stated age     Orientation: : Oriented to Self, Oriented to Place, Oriented to Situation, Oriented to  Time    Psych Involvement: No (comment)  Admission diagnosis:  Intracranial hemorrhage ([place]) [I62.9] 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 [address] Phone: [phone] Fax: [phone]  [id number] [place], [place] [address] [place] [address] Phone: [phone] Fax: [phone]     Social Determinants of Health (SDOH) Interventions    Readmission Risk Interventions No flowsheet data found.

## 2021-11-18 LAB — GLUCOSE, CAPILLARY
Glucose-Capillary: 97 mg/dL (ref 70–99)
Glucose-Capillary: 118 mg/dL — ABNORMAL HIGH (ref 70–99)
Glucose-Capillary: 128 mg/dL — ABNORMAL HIGH (ref 70–99)

## 2021-11-18 NOTE — Progress Notes (Signed)
Overall she appears to be stable based on previous notes.  Incision is okay.  She is awake and alert but may be somewhat disoriented and confused.  Moving all extremities.  Will need rehab

## 2021-11-19 DIAGNOSIS — I629 Nontraumatic intracranial hemorrhage, unspecified: Secondary | ICD-10-CM

## 2021-11-19 LAB — BASIC METABOLIC PANEL
Anion gap: 10 (ref 5–15)
Anion gap: 7 (ref 5–15)
BUN: 7 mg/dL (ref 6–20)
BUN: 5 mg/dL — ABNORMAL LOW (ref 6–20)
CO2: 24 mmol/L (ref 22–32)
CO2: 24 mmol/L (ref 22–32)
Calcium: 7.5 mg/dL — ABNORMAL LOW (ref 8.9–10.3)
Calcium: 7.6 mg/dL — ABNORMAL LOW (ref 8.9–10.3)
Chloride: 95 mmol/L — ABNORMAL LOW (ref 98–111)
Chloride: 93 mmol/L — ABNORMAL LOW (ref 98–111)
Creatinine, Ser: 0.55 mg/dL (ref 0.44–1.00)
Creatinine, Ser: 0.53 mg/dL (ref 0.44–1.00)
GFR, Estimated: >60 mL/min (ref >60)
GFR, Estimated: >60 mL/min (ref >60)
Glucose, Bld: 95 mg/dL (ref 70–99)
Glucose, Bld: 87 mg/dL (ref 70–99)
Potassium: 2.5 mmol/L — CL (ref 3.5–5.1)
Potassium: 3.2 mmol/L — ABNORMAL LOW (ref 3.5–5.1)
Sodium: 129 mmol/L — ABNORMAL LOW (ref 135–145)
Sodium: 124 mmol/L — ABNORMAL LOW (ref 135–145)

## 2021-11-19 LAB — CBC WITH DIFFERENTIAL/PLATELET
Abs Immature Granulocytes: 0.03 10*3/uL (ref 0.00–0.07)
Basophils Absolute: 0 10*3/uL (ref 0.0–0.1)
Basophils Relative: 0 %
Eosinophils Absolute: 0.1 10*3/uL (ref 0.0–0.5)
Eosinophils Relative: 1 %
HCT: 27.8 % — ABNORMAL LOW (ref 36.0–46.0)
Hemoglobin: 9.4 g/dL — ABNORMAL LOW (ref 12.0–15.0)
Immature Granulocytes: 0 %
Lymphocytes Relative: 10 %
Lymphs Abs: 0.9 10*3/uL (ref 0.7–4.0)
MCH: 29.3 pg (ref 26.0–34.0)
MCHC: 33.8 g/dL (ref 30.0–36.0)
MCV: 86.6 fL (ref 80.0–100.0)
Monocytes Absolute: 0.7 10*3/uL (ref 0.1–1.0)
Monocytes Relative: 8 %
Neutro Abs: 7.1 10*3/uL (ref 1.7–7.7)
Neutrophils Relative %: 81 %
Platelets: 221 10*3/uL (ref 150–400)
RBC: 3.21 MIL/uL — ABNORMAL LOW (ref 3.87–5.11)
RDW: 13 % (ref 11.5–15.5)
WBC: 8.8 10*3/uL (ref 4.0–10.5)
nRBC: 0 % (ref 0.0–0.2)

## 2021-11-19 LAB — TSH: TSH: 0.521 u[IU]/mL (ref 0.350–4.500)

## 2021-11-19 LAB — GLUCOSE, CAPILLARY
Glucose-Capillary: 86 mg/dL (ref 70–99)
Glucose-Capillary: 89 mg/dL (ref 70–99)

## 2021-11-19 LAB — MAGNESIUM: Magnesium: 1.8 mg/dL (ref 1.7–2.4)

## 2021-11-19 LAB — CORTISOL: Cortisol, Plasma: 14 ug/dL

## 2021-11-19 LAB — LACTIC ACID, PLASMA: Lactic Acid, Venous: 0.7 mmol/L (ref 0.5–1.9)

## 2021-11-19 LAB — PHOSPHORUS: Phosphorus: 2.8 mg/dL (ref 2.5–4.6)

## 2021-11-19 MED ORDER — POTASSIUM CHLORIDE 20 MEQ PO PACK: 40.0000 meq | PACK | Freq: Two times a day (BID) | ORAL | Status: DC

## 2021-11-19 MED ORDER — MAGNESIUM SULFATE 2 GM/50ML IV SOLN
2.0000 g | Freq: Once | INTRAVENOUS | Status: AC
Start: 2021-11-19 — End: 2021-11-19
  Administered 2021-11-19: 18:00:00 2 g via INTRAVENOUS
  Filled 2021-11-19: qty 50

## 2021-11-19 MED ORDER — POTASSIUM CHLORIDE 20 MEQ PO PACK: 40.0000 meq | PACK | Freq: Once | ORAL | Status: DC

## 2021-11-19 MED ORDER — POTASSIUM CHLORIDE CRYS ER 20 MEQ PO TBCR
40.0000 meq | EXTENDED_RELEASE_TABLET | ORAL | Status: AC
  Administered 2021-11-19 (×3): 40 meq via ORAL
  Filled 2021-11-19 (×3): qty 2

## 2021-11-19 MED ORDER — SODIUM CHLORIDE 0.9 % IV BOLUS
1000.0000 mL | Freq: Once | INTRAVENOUS | Status: AC
  Administered 2021-11-19: 14:00:00 1000 mL via INTRAVENOUS

## 2021-11-19 NOTE — Discharge Summary (Signed)
Physician Discharge Summary  Patient ID: Tanya Jordan MRN: [id number] DOB/AGE: 1965-12-15 55 y.o.  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 The patient tolerated the procedure well and was taken to the recovery room and then to the ICU in stable condition. The hospital course was routine. There were no complications. The wound remained clean dry and intact. Pt had appropriate head soreness. No complaints of new pain or new N/T/W. The patient remained afebrile with stable vital signs, and tolerated a regular diet. The patient continued to increase activities, and pain was well controlled with oral pain medications.   Consults: None  Significant Diagnostic Studies:  Results for orders placed or performed during the hospital encounter of 11/13/21  Resp Panel by RT-PCR (Flu A&B, Covid) Nasopharyngeal Swab   Specimen: Nasopharyngeal Swab; Nasopharyngeal(NP) swabs in vial transport medium  Result Value Ref Range   SARS Coronavirus 2 by RT PCR NEGATIVE NEGATIVE   Influenza A by PCR NEGATIVE NEGATIVE   Influenza B by PCR NEGATIVE NEGATIVE  MRSA Next Gen by PCR, Nasal   Specimen: Nasal Mucosa; Nasal Swab  Result Value Ref Range   MRSA by PCR Next Gen NOT DETECTED NOT DETECTED  Surgical pcr screen   Specimen: Nasal Mucosa; Nasal Swab  Result Value Ref Range   MRSA, PCR NEGATIVE NEGATIVE   Staphylococcus aureus NEGATIVE NEGATIVE  Comprehensive metabolic panel  Result Value Ref Range   Sodium 132 (L) 135 - 145 mmol/L   Potassium 4.8 3.5 - 5.1 mmol/L   Chloride 93 (L) 98 - 111 mmol/L   CO2 29 22 - 32 mmol/L   Glucose, Bld 117 (H) 70 - 99 mg/dL   BUN 11 6 - 20 mg/dL   Creatinine, Ser 0.63  0.44 - 1.00 mg/dL   Calcium 10.0 8.9 - 10.3 mg/dL   Total Protein 7.7 6.5 - 8.1 g/dL   Albumin 4.5 3.5 - 5.0 g/dL   AST 17 15 - 41 U/L   ALT 15 0 - 44 U/L   Alkaline Phosphatase 92 38 - 126 U/L   Total Bilirubin 0.5 0.3 - 1.2 mg/dL   GFR, Estimated >60 >60 mL/min   Anion gap 10 5 - 15  Lipase, blood  Result Value Ref Range   Lipase 17 11 - 51 U/L  CBC with Differential  Result Value Ref Range   WBC 8.9 4.0 - 10.5 K/uL   RBC 4.20 3.87 - 5.11 MIL/uL   Hemoglobin 12.1 12.0 - 15.0 g/dL   HCT 37.0 36.0 - 46.0 %   MCV 88.1 80.0 - 100.0 fL   MCH 28.8 26.0 - 34.0 pg   MCHC 32.7 30.0 - 36.0 g/dL   RDW 13.4 11.5 - 15.5 %   Platelets 301 150 - 400 K/uL   nRBC 0.0 0.0 - 0.2 %   Neutrophils Relative % 85 %   Neutro Abs 7.5 1.7 - 7.7 K/uL   Lymphocytes Relative 10 %   Lymphs Abs 0.9 0.7 - 4.0 K/uL   Monocytes Relative 5 %   Monocytes Absolute 0.5 0.1 - 1.0 K/uL   Eosinophils Relative 0 %   Eosinophils Absolute 0.0 0.0 - 0.5 K/uL   Basophils Relative 0 %   Basophils Absolute 0.0 0.0 -  0.1 K/uL   Immature Granulocytes 0 %   Abs Immature Granulocytes 0.03 0.00 - 0.07 K/uL  CBC  Result Value Ref Range   WBC 10.1 4.0 - 10.5 K/uL   RBC 3.97 3.87 - 5.11 MIL/uL   Hemoglobin 11.7 (L) 12.0 - 15.0 g/dL   HCT 34.0 (L) 36.0 - 46.0 %   MCV 85.6 80.0 - 100.0 fL   MCH 29.5 26.0 - 34.0 pg   MCHC 34.4 30.0 - 36.0 g/dL   RDW 12.9 11.5 - 15.5 %   Platelets 278 150 - 400 K/uL   nRBC 0.0 0.0 - 0.2 %  Creatinine, serum  Result Value Ref Range   Creatinine, Ser 0.72 0.44 - 1.00 mg/dL   GFR, Estimated >60 >60 mL/min  Basic metabolic panel  Result Value Ref Range   Sodium 118 (LL) 135 - 145 mmol/L   Potassium 3.0 (L) 3.5 - 5.1 mmol/L   Chloride 84 (L) 98 - 111 mmol/L   CO2 24 22 - 32 mmol/L   Glucose, Bld 123 (H) 70 - 99 mg/dL   BUN 11 6 - 20 mg/dL   Creatinine, Ser 0.46 0.44 - 1.00 mg/dL   Calcium 7.6 (L) 8.9 - 10.3 mg/dL   GFR, Estimated >60 >60 mL/min   Anion gap 10 5 - 15  Sodium   Result Value Ref Range   Sodium 127 (L) 135 - 145 mmol/L  Sodium  Result Value Ref Range   Sodium 131 (L) 135 - 145 mmol/L  Sodium  Result Value Ref Range   Sodium 132 (L) 135 - 145 mmol/L  Sodium  Result Value Ref Range   Sodium 133 (L) 135 - 145 mmol/L  Sodium  Result Value Ref Range   Sodium 133 (L) 135 - 145 mmol/L  Sodium  Result Value Ref Range   Sodium 135 135 - 145 mmol/L  Renal function panel  Result Value Ref Range   Sodium 132 (L) 135 - 145 mmol/L   Potassium 2.8 (L) 3.5 - 5.1 mmol/L   Chloride 99 98 - 111 mmol/L   CO2 26 22 - 32 mmol/L   Glucose, Bld 92 70 - 99 mg/dL   BUN 9 6 - 20 mg/dL   Creatinine, Ser 0.62 0.44 - 1.00 mg/dL   Calcium 8.1 (L) 8.9 - 10.3 mg/dL   Phosphorus 2.9 2.5 - 4.6 mg/dL   Albumin 2.7 (L) 3.5 - 5.0 g/dL   GFR, Estimated >60 >60 mL/min   Anion gap 7 5 - 15  Glucose, capillary  Result Value Ref Range   Glucose-Capillary 99 70 - 99 mg/dL  Glucose, capillary  Result Value Ref Range   Glucose-Capillary 94 70 - 99 mg/dL  Glucose, capillary  Result Value Ref Range   Glucose-Capillary 112 (H) 70 - 99 mg/dL  Glucose, capillary  Result Value Ref Range   Glucose-Capillary 97 70 - 99 mg/dL  Glucose, capillary  Result Value Ref Range   Glucose-Capillary 118 (H) 70 - 99 mg/dL   Comment 1 Notify RN    Comment 2 Document in Chart   Glucose, capillary  Result Value Ref Range   Glucose-Capillary 128 (H) 70 - 99 mg/dL   Comment 1 Notify RN    Comment 2 Document in Chart   I-STAT 7, (LYTES, BLD GAS, ICA, H+H)  Result Value Ref Range   pH, Arterial 7.435 7.350 - 7.450   pCO2 arterial 43.4 32.0 - 48.0 mmHg   pO2, Arterial 358 (H) 83.0 - 108.0  mmHg   Bicarbonate 29.1 (H) 20.0 - 28.0 mmol/L   TCO2 30 22 - 32 mmol/L   O2 Saturation 100.0 %   Acid-Base Excess 4.0 (H) 0.0 - 2.0 mmol/L   Sodium 126 (L) 135 - 145 mmol/L   Potassium 3.4 (L) 3.5 - 5.1 mmol/L   Calcium, Ion 1.12 (L) 1.15 - 1.40 mmol/L   HCT 36.0 36.0 - 46.0 %   Hemoglobin 12.2  12.0 - 15.0 g/dL   Sample type ARTERIAL   Type and screen  Result Value Ref Range   ABO/RH(D) O POS    Antibody Screen NEG    Sample Expiration      11/18/2021,2359 Performed at [facility] [address], [place], [place] [address]   ABO/Rh  Result Value Ref Range   ABO/RH(D)      O POS Performed at [facility] [address] [place], [place] [address]   Surgical pathology  Result Value Ref Range   SURGICAL PATHOLOGY      SURGICAL PATHOLOGY CASE: [id number] PATIENT: [facility] Pathology Report     Clinical History: brain tumor (cm)     FINAL MICROSCOPIC DIAGNOSIS:  A. BRAIN TUMOR, RIGHT CEREBELLAR, RESECTION: - Metastatic carcinoma, see comment      COMMENT:  Immunohistochemical stains show that the tumor cells are positive for cytokeratin AE1/AE3, CK8/18, TTF-1 and CK7 (focal) and negative for CK20, Napsin A, p63 and CK5/6.  This immunoprofile is consistent with a lung primary.  Clinical-radiologic correlation is suggested.  Dr. Zada Finders was notified on 11/17/2021.     GROSS DESCRIPTION:  Received fresh are 1.5 x 1.5 x 0.3 cm of soft red tissue.  The specimen is submitted in toto.  [facility] 11/15/2021)   Final Diagnosis performed by Jaquita Folds, MD.   Electronically signed 11/17/2021 Technical component performed at [facility]. [facility] [place] [address], [place], [place] [address].  Professional component performed at [facility], [place] [address] [place], [place] [address].  Immunohistochemistry Technical component (if applicable) was performed at [facility]. [address] [place], [place], [place] [address].   IMMUNOHISTOCHEMISTRY DISCLAIMER (if applicable): Some of these immunohistochemical stains may have been developed and the performance characteristics determine by [facility]. Some may not have been cleared or approved by the U.S. Food and  Drug Administration. The FDA has determined that such clearance or approval is not necessary. This test is used for clinical purposes. It should not be regarded as investigational or for research. This laboratory is certified under the [place] (CLIA-88) as qualified to perform high complexity clinical laboratory testing.  The controls stained appropriately.   Troponin I (High Sensitivity)  Result Value Ref Range   Troponin I (High Sensitivity) 22 (H) <18 ng/L  Troponin I (High Sensitivity)  Result Value Ref Range   Troponin I (High Sensitivity) 22 (H) <18 ng/L    DG Chest 2 View  Result Date: 11/13/2021 CLINICAL DATA:  Vomiting.  Dizziness.  Sinus infection. EXAM: CHEST - 2 VIEW COMPARISON:  01/14/2016 FINDINGS: The heart size and mediastinal contours are within normal limits. 2.8 cm nodular opacity is seen in the right upper lobe. Another 1.5 cm nodular opacity is seen in the right lung base. New pulmonary infiltrate is seen in the inferior right upper lobe. No evidence of pleural effusion. Mild asymmetric soft tissue prominence is seen in the right hilum, suspicious for hilar lymphadenopathy. : Two  right lung nodules and possible right hilar lymphadenopathy, raising suspicion for malignancy. Chest CT with contrast is recommended for further evaluation. New pulmonary infiltrate in inferior right upper lobe. Electronically Signed   By: Marlaine Hind M.D.   On: 11/13/2021 12:34   DG Lumbar Spine Complete  Result Date: 11/13/2021 CLINICAL DATA:  Back pain, possible malignancy on chest radiograph EXAM: LUMBAR SPINE - COMPLETE 4+ VIEW COMPARISON:  None. FINDINGS: There are 5 lumbar vertebral bodies. No pars break. Vertebral body heights and alignment are maintained. 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 Disc space narrowing is present and greatest at L5-S1. Facet hypertrophy is greatest at lower lumbar levels. IMPRESSION: No compression fracture or large  lesion. There is limited evaluation for malignancy by radiograph. 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 Electronically Signed   By: Macy Mis M.D.   On: 11/13/2021 13:31   CT Head Wo Contrast  Result Date: 11/13/2021 CLINICAL DATA:  Weakness. EXAM: CT HEAD WITHOUT CONTRAST TECHNIQUE: Contiguous axial images were obtained from the base of the skull through the vertex without intravenous contrast. COMPARISON:  None. FINDINGS: Brain: 3 x 2 cm intraparenchymal hemorrhage is noted in the right cerebellar hemisphere. Ventricular size is within normal limits. No midline shift is noted. Vascular: No hyperdense vessel or unexpected calcification. Skull: Normal. Negative for fracture or focal lesion. Sinuses/Orbits: No acute finding. Other: None. IMPRESSION: 3 x 2 cm intraparenchymal hemorrhage is noted in right cerebellar hemisphere. Critical Value/emergent results were called by telephone at the time of interpretation on 11/13/2021 at 3:59 pm to provider Theodis Blaze , who verbally acknowledged these results. Electronically Signed   By: Marijo Conception M.D.   On: 11/13/2021 15:59   CT Chest W Contrast  Result Date: 11/13/2021 CLINICAL DATA:  Abnormal chest x-ray EXAM: CT CHEST WITH CONTRAST TECHNIQUE: Multidetector CT imaging of the chest was performed during intravenous contrast administration. CONTRAST:  38mL OMNIPAQUE IOHEXOL 300 MG/ML  SOLN COMPARISON:  Same day chest x-ray FINDINGS: Cardiovascular: Normal heart size. Three-vessel coronary artery calcifications. Atherosclerotic disease of the thoracic aorta. No suspicious filling defects of the central pulmonary arteries. Mediastinum/Nodes: Markedly enlarged right hilar lymph node, measuring up to 2.4 cm in short axis. Mildly enlarged right lower paratracheal lymph node measuring 1.0 cm in short axis on series 2, image 61. Rounded left hilar lymph node with central low density measuring 8 mm on series 2, image 88. Esophagus is  unremarkable. Lungs/Pleura: Central airways are patent. Centrilobular emphysema. Spiculated left upper lobe pulmonary nodule measuring 2.5 x 1.5 cm on series 4, image 59 with adjacent satellite nodules measuring 5 mm on image 46 and 6 mm on image 51. Additional spiculated pulmonary nodule is seen more inferiorly in the right upper lobe which measures 0.9 x 0.7 cm on image 76. Intralobular septal thickening is seen in the peripheral right upper lobe adjacent to the nodules. Solid pulmonary nodule of the left upper lobe measuring 6 mm on series 4 image 70. Upper Abdomen: Nodular thickening of the left adrenal gland. Musculoskeletal: No chest wall abnormality. No acute or significant osseous findings. IMPRESSION: 1. Spiculated solid pulmonary nodules of the right upper lobe, largest measures 2.5 cm, findings are concerning for primary lung malignancy. 2. Intralobular septal thickening is seen in the peripheral right upper lobe adjacent to the nodules, concerning for lymphangitic spread. 3. Solid pulmonary nodule of the left upper lobe measuring 6 mm, concerning for additional site of disease. 4. Markedly enlarged right hilar lymph node and mildly enlarged  right lower paratracheal lymph node, concerning for nodal metastatic disease. 5. Subcentimeter left hilar lymph node with rounded morphology, possibly an additional site of metastatic disease. 6. Nodular thickening of the left adrenal gland, concerning for metastatic disease. 7. Aortic Atherosclerosis (ICD10-I70.0) and Emphysema (ICD10-J43.9). Electronically Signed   By: Yetta Glassman M.D.   On: 11/13/2021 14:31   MR BRAIN W WO CONTRAST  Result Date: 11/16/2021 CLINICAL DATA:  Assess treatment response. Interval resection of hemorrhagic mass. EXAM: MRI HEAD WITHOUT AND WITH CONTRAST TECHNIQUE: Multiplanar, multiecho pulse sequences of the brain and surrounding structures were obtained without and with intravenous contrast. CONTRAST:  5.25mL GADAVIST GADOBUTROL  1 MMOL/ML IV SOLN COMPARISON:  Preop brain MRI from 2 days ago FINDINGS: Brain: Resected right cerebellar mass with expected blood products and edema. Expected pneumocephalus which accounts for FLAIR signal changes at the vertex. No worrisome remaining enhancement. No complicating infarct or hydrocephalus. No newly detected mass. Vascular: Normal flow voids and dural sinus enhancement. Skull and upper cervical spine: Unremarkable craniotomy Sinuses/Orbits: Negative IMPRESSION: Resected cerebellar mass without complicating feature or visible residual. Electronically Signed   By: Jorje Guild M.D.   On: 11/16/2021 08:24   MR BRAIN W WO CONTRAST  Result Date: 11/14/2021 CLINICAL DATA:  Evaluate cerebellar hemorrhage. Concern for underlying mass EXAM: MRI HEAD WITHOUT AND WITH CONTRAST TECHNIQUE: Multiplanar, multiecho pulse sequences of the brain and surrounding structures were obtained without and with intravenous contrast. CONTRAST:  52mL GADAVIST GADOBUTROL 1 MMOL/ML IV SOLN COMPARISON:  Head CT from yesterday FINDINGS: Brain: Known hematoma in the anterior right cerebellum which shows thin mural enhancement and wispy anterior and internal enhancement. Overall dimensions are 31 x 20 x 20 mm on postcontrast imaging. No second lesion is seen. Remote white matter insults in the frontal lobes with mineralization. No acute infarct, hydrocephalus, or collection. Vascular: Normal flow voids and vascular enhancements Skull and upper cervical spine: Normal marrow signal Sinuses/Orbits: Negative IMPRESSION: Mural and internal enhancement at the right cerebellar hematoma, suspect underlying metastatic disease given chest findings. No second lesion. Electronically Signed   By: Jorje Guild M.D.   On: 11/14/2021 04:36   CT BRAINLAB HEAD W/O CONTRAST (1MM)  Result Date: 11/14/2021 CLINICAL DATA:  Intracranial hemorrhage EXAM: CT HEAD WITHOUT CONTRAST TECHNIQUE: Contiguous axial images were obtained from the base of  the skull through the vertex without intravenous contrast. COMPARISON:  MRI same day.  Head CT yesterday. FINDINGS: Brain: Redemonstration of a hyperdense lesion in the right cerebellum with mass effect and surrounding edema. Based on the previous imaging, this is felt to represent a necrotic metastasis with internal hemorrhage. Hyperdense component measures slightly larger today, at about 3.5 x 2 x 1.8 cm, compared with maximal dimension of about 3.2 cm yesterday. Mild mass-effect upon the fourth ventricle but no ventricular obstruction. The brain otherwise appears normal. No second lesion is identified. No evidence of ischemic stroke. No hydrocephalus or extra-axial collection. Vascular: No abnormal vascular finding. Skull: Negative Sinuses/Orbits: Clear/normal Other: None IMPRESSION: Redemonstration of a hyperdense lesion in the right lateral cerebellum with surrounding edema, felt most consistent with a necrotic metastasis with internal hemorrhage. Greatest dimension of the lesion is slightly larger, 3.5 cm today compared with 3.2 cm on earlier imaging. Electronically Signed   By: Nelson Chimes M.D.   On: 11/14/2021 11:05    Antibiotics:  Anti-infectives (From admission, onward)    Start     Dose/Rate Route Frequency Ordered Stop   11/15/21 0817  ceFAZolin (ANCEF) 2-4  GM/100ML-% IVPB       Note to Pharmacy: [employer] [employer] A: cabinet override      11/15/21 0817 11/15/21 0921   11/15/21 0800  ceFAZolin (ANCEF) IVPB 2g/100 mL premix        2 g 200 mL/hr over 30 Minutes Intravenous To [facility] 11/15/21 0006 11/15/21 0928       Discharge Exam: Blood pressure (!) 141/87, pulse 96, temperature 99.2 F (37.3 C), temperature source Oral, resp. rate 18, height 5\' 4"  (1.626 m), weight 54.1 kg, last menstrual period 03/29/2011, SpO2 97 %. Neurologic: Grossly normal Ambulating and voiding well, incision cdi   Discharge Medications:   Allergies as of 11/19/2021       Reactions   Lisinopril  Swelling        Medication List     STOP taking these medications    amoxicillin-clavulanate 875-125 MG tablet Commonly known as: AUGMENTIN       TAKE these medications    acetaminophen 325 MG tablet Commonly known as: TYLENOL Take 650 mg by mouth every 6 (six) hours as needed for mild pain, fever or headache.   albuterol 108 (90 Base) MCG/ACT inhaler Commonly known as: VENTOLIN HFA INHALE 2 PUFFS INTO THE LUNGS EVERY 4 (FOUR) HOURS AS NEEDED FOR WHEEZING OR SHORTNESS OF BREATH.   amLODipine 10 MG tablet Commonly known as: NORVASC TAKE 1 TABLET (10 MG TOTAL) BY MOUTH DAILY. What changed: how much to take   cetirizine 10 MG tablet Commonly known as: ZYRTEC Take 1 tablet (10 mg total) by mouth daily.   cloNIDine 0.2 MG tablet Commonly known as: CATAPRES TAKE 1 TABLET (0.2 MG TOTAL) BY MOUTH AT BEDTIME FOR HOT FLASHES What changed:  how much to take how to take this when to take this   fluticasone 50 MCG/ACT nasal spray Commonly known as: FLONASE Place 1 spray into both nostrils daily for 7 days.   Incruse Ellipta 62.5 MCG/ACT Aepb Generic drug: umeclidinium bromide Inhale 1 puff into the lungs daily.   montelukast 10 MG tablet Commonly known as: SINGULAIR TAKE 1 TABLET (10 MG TOTAL) BY MOUTH AT BEDTIME.   mupirocin cream 2 % Commonly known as: Bactroban Apply 1 application topically 2 (two) times daily. Apply to right ear   nicotine 21 mg/24hr patch Commonly known as: Nicoderm CQ Place 1 patch (21 mg total) onto the skin daily.   Symbicort 160-4.5 MCG/ACT inhaler Generic drug: budesonide-formoterol Inhale 2 puffs into the lungs 2 (two) times daily. What changed:  when to take this reasons to take this        Disposition: home   Final Dx: right retrosigmoid craniotomy for tumor excision  Discharge Instructions     Ambulatory referral to Occupational Therapy   Complete by: As directed    Ambulatory referral to Physical Therapy   Complete  by: As directed    Call MD for:  difficulty breathing, headache or visual disturbances   Complete by: As directed    Call MD for:  hives   Complete by: As directed    Call MD for:  persistant nausea and vomiting   Complete by: As directed    Call MD for:  redness, tenderness, or signs of infection (pain, swelling, redness, odor or green/yellow discharge around incision site)   Complete by: As directed    Call MD for:  severe uncontrolled pain   Complete by: As directed    Call MD for:  temperature >100.4   Complete by: As  directed    Diet - low sodium heart healthy   Complete by: As directed    Increase activity slowly   Complete by: As directed    No wound care   Complete by: As directed         Follow-up Information     [facility] Follow up.   Specialty: Home Health Services Why: The home health agency will contact you for the first home visit. Contact information: [address] [place] [address] [phone]                  Signed: Ocie Cornfield [facility] 11/19/2021, 8:27 AM

## 2021-11-19 NOTE — Progress Notes (Signed)
Patient potassium = 2.5 Neuro Office called and a message left for [place], MD for potassium replacement.  They were given my call back number and instructed me I would receive a call back

## 2021-11-19 NOTE — TOC Transition Note (Signed)
Transition of Care [facility]) - CM/SW Discharge Note   Patient Details  Name: Tanya Jordan MRN: [id number] Date of Birth: 1966-10-31  [facility]) CM/SW Contact:  Carles Collet, RN Phone Number: 11/19/2021, 8:50 AM   Clinical Narrative:    Notified Erline Levine, [place] liaison, of DC today. No other TOC needs identified    Final next level of care: [place] Barriers to Discharge: No Barriers Identified   Patient Goals and CMS Choice Patient states their goals for this hospitalization and ongoing recovery are:: to go home CMS Medicare.gov Compare Post Acute Care list provided to:: Patient Represenative (must comment) Choice offered to / list presented to : Adult Children  Discharge Placement                       Discharge Plan and Services   Discharge Planning Services: CM Consult Post Acute Care Choice: Home Health                    HH Arranged: PT, OT, RN [facility] Agency: [place] Date [place]: 11/19/21 Time [place]: [phone] Representative spoke with at [place]: [place] Determinants of Health ([place]) Interventions     Readmission Risk Interventions No flowsheet data found.

## 2021-11-19 NOTE — Progress Notes (Signed)
K=2.5, po replacement x3 and repeat K level at 23:00.  One dose of Mg sulfate 2g and check Mg level and Phos level.

## 2021-11-19 NOTE — Consult Note (Signed)
[facility] Medical Consultation  SARANDA LEGRANDE HUT:[id number] DOB: [birth year] DOA: 11/13/2021 PCP: Charlott Rakes, MD   Requesting physician: Dr. Zada Finders Date of consultation: 11/19/2021 Reason for consultation: Hypotension  Impression/Recommendations Principal Problem:   Intracranial hemorrhage ([place]) Active Problems:   Cerebellar hemorrhage ([place])    Orthostatic hypotension -Clinically looks hypovolemic -Will give one IV bolus and then re-evaluate orthostatic vitals -For BP control, patient received today's amlodipine, will orthostatic blood pressure tonight and tomorrow morning and then continue amlodipine if BP stable. -We will also check TSH and cortisol level.  Low suspicion for sepsis, given chest no cough, no urinary symptoms or diarrhea. -Will need to recheck her orthostatic BP after IV bolus before safe discharge.  HTN -Continue amlodipine -Check orthostatic vital signs time tomorrow morning -Discussed with patient regarding avoid clonidine at night, but patient insisted taking clonidine.  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  Primary lung cancer -Will need outpatient pulmonary to oncology consult.  Hemorrhagic metastatic brain tumor status postcraniotomy -As per neurosurgery team  COPD -Stable, no symptoms or signs of acute exacerbation.  I will followup again tomorrow. Please contact me if I can be of assistance in the meanwhile. Thank you for this consultation.  Chief Complaint: Feeling dizzy  HPI:   55 year old female patient with past medical history of HTN, COPD, cigarette smoker, presented with bilateral ear fullness and loss of hearing as well as global headache, nauseous vomiting.  The ED, chest x-ray concerns for malignancy and CT scan confirmed a right upper lobe 2.5 centimeter spiculated solid nodule concerning for lung CA.  CT head showed 3 x 2 cm intraparenchymal hemorrhage in the right cerebellar  hemisphere.  MRI showed Interval has been right cerebellar hematoma suspicious for metastatic disease.  Patient did not underwent right retrosigmoid craniotomy for tumor resection 4 days ago, patient tolerated procedure well.  After craniotomy, patient has been bedbound for the last 4 days, blood pressure control wise, she has been on Cardene drip until last night.  This morning, patient was standing up and then suddenly felt lightheadedness and blurry vision, orthostatic vital signs positive with blood pressure lying down 146/88 compared to 101/71 when standing. HR 96 lying down versus 113 standing up.  Patient reported she has been eating and drinking well, denies any nauseous vomiting, no abdominal pain no diarrhea.  No cough no urinary symptoms no fever or chills.  Review of Systems:  14 point review of symptoms negative except for those mentioned in HPI. Past Medical History:  Diagnosis Date   Allergy    COPD (chronic obstructive pulmonary disease) ([place])    Family history of breast cancer    Hypertension    Personal history of colonic polyps    Past Surgical History:  Procedure Laterality Date   APPLICATION OF CRANIAL NAVIGATION N/A 11/15/2021   Procedure: APPLICATION OF CRANIAL NAVIGATION;  Surgeon: Judith Part, MD;  Location: [place];  Service: Neurosurgery;  Laterality: N/A;   BREAST BIOPSY Left    CRANIOTOMY Right 11/15/2021   Procedure: Right craniotomy for tumor resection with brainlab;  Surgeon: Judith Part, MD;  Location: [place];  Service: Neurosurgery;  Laterality: Right;   REVISION OF SCAR ON FACE/HEAD     Social History:  reports that she has been smoking cigarettes. She has a 7.50 pack-year smoking history. She has never used smokeless tobacco. She reports that she does not currently use alcohol after a past usage of about 12.0 standard drinks per week.  She reports that she does not use drugs.  Allergies  Allergen Reactions   Lisinopril Swelling    Family History  Problem Relation Age of Onset   Lung cancer Father    Hypertension Mother    Breast cancer Maternal Grandmother 96   Dementia Maternal Grandmother    Diabetes Maternal Grandfather    Colon cancer Neg Hx    Stomach cancer Neg Hx     Prior to Admission medications   Medication Sig Start Date End Date Taking? Authorizing Provider  acetaminophen (TYLENOL) 325 MG tablet Take 650 mg by mouth every 6 (six) hours as needed for mild pain, fever or headache.   Yes [provider]  albuterol (VENTOLIN HFA) 108 (90 Base) MCG/ACT inhaler INHALE 2 PUFFS INTO THE LUNGS EVERY 4 (FOUR) HOURS AS NEEDED FOR WHEEZING OR SHORTNESS OF BREATH. 03/28/21 03/28/22 Yes Newlin, Enobong, MD  amLODipine (NORVASC) 10 MG tablet TAKE 1 TABLET (10 MG TOTAL) BY MOUTH DAILY. Patient taking differently: Take 10 mg by mouth daily. 10/09/21 10/09/22 Yes Charlott Rakes, MD  amoxicillin-clavulanate (AUGMENTIN) 875-125 MG tablet Take 1 tablet by mouth every 12 (twelve) hours. 11/11/21  Yes Francene Finders, PA-C  budesonide-formoterol [facility]) 160-4.5 MCG/ACT inhaler Inhale 2 puffs into the lungs 2 (two) times daily. Patient taking differently: Inhale 2 puffs into the lungs 2 (two) times daily as needed (wheezing/shortness of breath). 03/28/21  Yes Charlott Rakes, MD  cetirizine (ZYRTEC) 10 MG tablet Take 1 tablet (10 mg total) by mouth daily. 01/12/20  Yes Newlin, Charlane Ferretti, MD  cloNIDine (CATAPRES) 0.2 MG tablet TAKE 1 TABLET (0.2 MG TOTAL) BY MOUTH AT BEDTIME FOR HOT FLASHES Patient taking differently: Take 0.2 mg by mouth at bedtime. 10/18/21 10/18/22 Yes Newlin, Charlane Ferretti, MD  fluticasone (FLONASE) 50 MCG/ACT nasal spray Place 1 spray into both nostrils daily for 7 days. Patient not taking: Reported on 11/14/2021 11/11/21 11/18/21  Francene Finders, PA-C  montelukast (SINGULAIR) 10 MG tablet TAKE 1 TABLET (10 MG TOTAL) BY MOUTH AT BEDTIME. Patient not taking: Reported on 11/14/2021 03/28/21 03/28/22   Charlott Rakes, MD  mupirocin cream (BACTROBAN) 2 % Apply 1 application topically 2 (two) times daily. Apply to right ear Patient not taking: Reported on 11/14/2021 07/11/17   Charlott Rakes, MD  nicotine (NICODERM CQ) 21 mg/24hr patch Place 1 patch (21 mg total) onto the skin daily. Patient not taking: Reported on 11/14/2021 08/16/20   Charlott Rakes, MD  umeclidinium bromide (INCRUSE ELLIPTA) 62.5 MCG/INH AEPB Inhale 1 puff into the lungs daily. Patient not taking: Reported on 11/14/2021 08/16/20   Charlott Rakes, MD   Physical Exam: Blood pressure 126/75, pulse 90, temperature 99.9 F (37.7 C), temperature source Oral, resp. rate 18, height 5\' 4"  (1.626 m), weight 54.1 kg, last menstrual period 03/29/2011, SpO2 97 %. Vitals:   11/19/21 1020 11/19/21 1209  BP:  126/75  Pulse: 90 90  Resp: (!) 22 18  Temp:  99.9 F (37.7 C)  SpO2: 96% 97%    General: No acute distress Eyes: PERRL ENT: Mucous membranes dry Neck: Supple no JVD Cardiovascular: RRR, no murmurs Respiratory: Clear breathing bilaterally, no crackles no wheezing Abdomen: Soft nontender nondistended Skin: No ulcers no rash Musculoskeletal: Normal ROM Psychiatric: Calm Neurologic: No focal deficit  Labs on Admission:  Basic Metabolic Panel: Recent Labs  Lab 11/13/21 1248 11/14/21 0705 11/15/21 0902 11/15/21 0940 11/15/21 1324 11/15/21 1730 11/15/21 2150 11/16/21 0135 11/16/21 0544 11/17/21 0244  NA 132*  --  126* 118*   < >  132* 133* 133* 135 132*  K 4.8  --  3.4* 3.0*  --   --   --   --   --  2.8*  CL 93*  --   --  84*  --   --   --   --   --  99  CO2 29  --   --  24  --   --   --   --   --  26  GLUCOSE 117*  --   --  123*  --   --   --   --   --  92  BUN 11  --   --  11  --   --   --   --   --  9  CREATININE 0.63 0.72  --  0.46  --   --   --   --   --  0.62  CALCIUM 10.0  --   --  7.6*  --   --   --   --   --  8.1*  PHOS  --   --   --   --   --   --   --   --   --  2.9   < > = values in this interval  not displayed.   Liver Function Tests: Recent Labs  Lab 11/13/21 1248 11/17/21 0244  AST 17  --   ALT 15  --   ALKPHOS 92  --   BILITOT 0.5  --   PROT 7.7  --   ALBUMIN 4.5 2.7*   Recent Labs  Lab 11/13/21 1248  LIPASE 17   No results for input(s): AMMONIA in the last 168 hours. CBC: Recent Labs  Lab 11/13/21 1248 11/14/21 0705 11/15/21 0902  WBC 8.9 10.1  --   NEUTROABS 7.5  --   --   HGB 12.1 11.7* 12.2  HCT 37.0 34.0* 36.0  MCV 88.1 85.6  --   PLT 301 278  --    Cardiac Enzymes: No results for input(s): CKTOTAL, CKMB, CKMBINDEX, TROPONINI in the last 168 hours. BNP: Invalid input(s): POCBNP CBG: Recent Labs  Lab 11/17/21 1207 11/17/21 2110 11/18/21 1204 11/18/21 1729 11/19/21 1223  GLUCAP 97 112* 118* 128* 86    Radiological Exams on Admission: No results found.  EKG: Independently reviewed. Sinus, partial RBBB  Time spent: [place] [facility] Pager [phone] 11/19/2021, 2:03 PM

## 2021-11-20 ENCOUNTER — Inpatient Hospital Stay (HOSPITAL_COMMUNITY): Payer: 59

## 2021-11-20 LAB — BASIC METABOLIC PANEL
Anion gap: 11 (ref 5–15)
BUN: 6 mg/dL (ref 6–20)
CO2: 22 mmol/L (ref 22–32)
Calcium: 8.1 mg/dL — ABNORMAL LOW (ref 8.9–10.3)
Chloride: 96 mmol/L — ABNORMAL LOW (ref 98–111)
Creatinine, Ser: 0.53 mg/dL (ref 0.44–1.00)
GFR, Estimated: >60 mL/min (ref >60)
Glucose, Bld: 68 mg/dL — ABNORMAL LOW (ref 70–99)
Potassium: 3.8 mmol/L (ref 3.5–5.1)
Sodium: 129 mmol/L — ABNORMAL LOW (ref 135–145)

## 2021-11-20 LAB — CBC WITH DIFFERENTIAL/PLATELET
Abs Immature Granulocytes: 0.08 10*3/uL — ABNORMAL HIGH (ref 0.00–0.07)
Basophils Absolute: 0 10*3/uL (ref 0.0–0.1)
Basophils Relative: 0 %
Eosinophils Absolute: 0.1 10*3/uL (ref 0.0–0.5)
Eosinophils Relative: 1 %
HCT: 28.2 % — ABNORMAL LOW (ref 36.0–46.0)
Hemoglobin: 9.4 g/dL — ABNORMAL LOW (ref 12.0–15.0)
Immature Granulocytes: 1 %
Lymphocytes Relative: 8 %
Lymphs Abs: 1 10*3/uL (ref 0.7–4.0)
MCH: 28.9 pg (ref 26.0–34.0)
MCHC: 33.3 g/dL (ref 30.0–36.0)
MCV: 86.8 fL (ref 80.0–100.0)
Monocytes Absolute: 0.9 10*3/uL (ref 0.1–1.0)
Monocytes Relative: 7 %
Neutro Abs: 10.1 10*3/uL — ABNORMAL HIGH (ref 1.7–7.7)
Neutrophils Relative %: 83 %
Platelets: 240 10*3/uL (ref 150–400)
RBC: 3.25 MIL/uL — ABNORMAL LOW (ref 3.87–5.11)
RDW: 13 % (ref 11.5–15.5)
WBC: 12.2 10*3/uL — ABNORMAL HIGH (ref 4.0–10.5)
nRBC: 0 % (ref 0.0–0.2)

## 2021-11-20 LAB — OSMOLALITY: Osmolality: 266 mOsm/kg — ABNORMAL LOW (ref 275–295)

## 2021-11-20 LAB — HEMOGLOBIN AND HEMATOCRIT, BLOOD
HCT: 27.5 % — ABNORMAL LOW (ref 36.0–46.0)
Hemoglobin: 9.2 g/dL — ABNORMAL LOW (ref 12.0–15.0)

## 2021-11-20 LAB — LACTIC ACID, PLASMA: Lactic Acid, Venous: 0.6 mmol/L (ref 0.5–1.9)

## 2021-11-20 LAB — POTASSIUM: Potassium: 4.1 mmol/L (ref 3.5–5.1)

## 2021-11-20 LAB — PROCALCITONIN: Procalcitonin: 0.29 ng/mL

## 2021-11-20 LAB — OSMOLALITY, URINE: Osmolality, Ur: 643 mOsm/kg (ref 300–900)

## 2021-11-20 MED ORDER — GUAIFENESIN ER 600 MG PO TB12
600.0000 mg | ORAL_TABLET | Freq: Two times a day (BID) | ORAL | Status: DC
  Administered 2021-11-20 – 2021-11-21 (×4): 600 mg via ORAL
  Filled 2021-11-20 (×5): qty 1

## 2021-11-20 MED ORDER — SODIUM CHLORIDE 0.9 % IV SOLN
500.0000 mg | INTRAVENOUS | Status: DC
  Administered 2021-11-20 – 2021-11-22 (×3): 500 mg via INTRAVENOUS
  Filled 2021-11-20 (×3): qty 5

## 2021-11-20 MED ORDER — DILTIAZEM HCL-DEXTROSE 125-5 MG/125ML-% IV SOLN (PREMIX)
5.0000 mg/h | INTRAVENOUS | Status: DC
  Administered 2021-11-20: 22:00:00 5 mg/h via INTRAVENOUS
  Filled 2021-11-20: qty 125

## 2021-11-20 MED ORDER — MECLIZINE HCL 12.5 MG PO TABS: 12.5000 mg | ORAL_TABLET | Freq: Three times a day (TID) | ORAL | Status: DC | PRN

## 2021-11-20 MED ORDER — SODIUM CHLORIDE 0.9 % IV SOLN
1.0000 g | INTRAVENOUS | Status: DC
  Administered 2021-11-20 – 2021-11-22 (×3): 1 g via INTRAVENOUS
  Filled 2021-11-20 (×3): qty 10

## 2021-11-20 NOTE — Progress Notes (Signed)
Patient reported feeling slightly dizzy on sitting, standing.   11/20/21 1700  Orthostatic Lying   BP- Lying 131/78  Pulse- Lying 94  Orthostatic Sitting  BP- Sitting (!) 111/99  Pulse- Sitting 102  Orthostatic Standing at 0 minutes  BP- Standing at 0 minutes (!) 82/49  Pulse- Standing at 0 minutes 116  Orthostatic Standing at 3 minutes  BP- Standing at 3 minutes 92/67  Pulse- Standing at 3 minutes 116  Oxygen Therapy  SpO2 97 %  O2 Device Nasal Cannula  O2 Flow Rate (L/min) 2 L/min

## 2021-11-20 NOTE — Progress Notes (Signed)
Pt sleeping, no s/s distress, HR 100, O2sat 28, RR 28. Will continue to monitor.

## 2021-11-20 NOTE — Plan of Care (Signed)

## 2021-11-20 NOTE — Progress Notes (Signed)
Pt showing Afib on monitor. Dr. Alcario Drought informed and a EKG recommended if order can be placed. Pt asymptomatic otherwise respirations elevated.

## 2021-11-20 NOTE — Progress Notes (Signed)
Pt MEWS red at 1148, temp 101.8, 140/77, HR 91, RR 28 (ranging from 28-35), O2 89, sleeping, no pain, no SOB, no other s/s, gave Tylenol. PT then went to afib on monitor several minutes, and HR 140s-150s for about 10-15 minutes. MD Dahal contacted twice during this period, immediately responded RN to put 2L oxygen on, RN also contacted Hella with [facility], further messaged MD Dahal asking whether sepsis protocol, interventions to be ordered? Waiting for response, will continue to monitor.

## 2021-11-20 NOTE — Progress Notes (Addendum)
PROGRESS NOTE  Tanya Jordan  DOB: Jul 02, 1966  PCP: Charlott Rakes, MD EQA:[id number]  DOA: 11/13/2021  LOS: 7 days  Hospital Day: 8  Chief Complaint  Patient presents with   Emesis   Brief narrative: Tanya Jordan is a 55 y.o. female with PMH significant for HTN, COPD, chronic smoking Patient presented to the ED on 12/12 with complaint of bilateral ear fullness and loss of hearing, global headache, nausea vomiting. Imaging in the ED with chest x-ray and CT scan showed a right upper lobe 2.5 centimeter spiculated solid nodule concerning for lung CA.   CT head and MRI brain showed 3 x 2 cm intraparenchymal hemorrhage in the right cerebellar hemisphere, suspicious for metastatic disease.  Patient was admitted to neurosurgery service. 12/14, patient underwent right retrosigmoid craniotomy for tumor resection  Postoperatively, patient was placed on Cardene drip for blood pressure control.  He was taken off on the night of 12/17. Next day on 12/18, while standing up, patient felt lightheaded and blurry vision. Orthostatic vital signs positive with a blood pressure dropped from 146/88 while lying down to 101/71 while standing up. Hospitalist service was consulted.  Subjective: Patient was seen and examined this morning.  Pleasant middle-aged Caucasian female.  Propped up in bed.  Hard of hearing.  Not in distress.  No new symptoms. Has not walked this morning. Chart reviewed Patient has a temperature up to 100.8 this morning.  Heart rate up to 28, requiring 3 L oxygen by nasal cannula.  Blood pressure stable.   Patient had BMP done twice.  Sodium level was 120 9 in the afternoon and feels to 124 in the night.  Calcium level was low at 2.5 which improved to 4.1 with replacement.  Assessment/Plan: New onset fever -Patient is having multiple episodes of fever this morning up to one 1.8 associated with tachypnea, hypoxia.   -Obtain labs with CBC, lactic acid, procalcitonin level,  blood culture -Obtain chest x-ray -Start on empiric treatment with IV Rocephin and IV azithromycin Recent Labs  Lab 11/14/21 0705 11/19/21 1449 11/20/21 1330  WBC 10.1 8.8 12.2*  LATICACIDVEN  --  0.7 0.6  PROCALCITON  --   --  0.29   Orthostatic hypotension History of hypertension -Multifactorial: Poor oral intake, use of pain medicines, bedbound status for 4 days after craniotomy with tumor resection -Given IV hydration. -TSH and cortisol levels normal. -Patient does not have any ongoing fluid loss with diarrhea, vomiting. -Repeat orthostatic vital signs this morning show improvement. -Prior to admission, patient was on clonidine and amlodipine. Postoperatively, patient was continued on amlodipine and also placed on Cardene drip which was stopped on the night of 12/17. -I would hold oral blood pressure meds at this time as long as blood pressure is less than 140/90.  Hemorrhagic metastatic brain tumor -12/14, patient underwent right retrosigmoid craniotomy for tumor resection  New diagnosis of primary lung cancer with brain mets -Imaging findings as above. -Surgical pathology report from craniotomy tumor resection with immunohistochemical stains suggestive of primary lung cancer. -I discussed with oncology Dr. Lindi Adie today.  Patient will follow up with oncology Dr. Earlie Server at cancer center.  Hyponatremia -Sodium level was the lowest at 118.  Gradually increased but fluctuating, for the last 3 days, its been consistently dropping. -This likely is related to SIADH from primary lung tumor.  We will obtain serum osmolality and urine osmolality. -I will put her on fluid restriction at 1200 mill per day.  Cannot use Lasix as patient had  orthostatic hypotension yesterday only. Recent Labs  Lab 11/15/21 1324 11/15/21 1502 11/15/21 1730 11/15/21 2150 11/16/21 0135 11/16/21 0544 11/17/21 0244 11/19/21 1449 11/19/21 2246 11/20/21 1330  NA 127* 131* 132* 133* 133* 135 132* 129*  124* 129*    COPD -Stable, no symptoms or signs of acute exacerbation.  Mobility: PT evaluation was obtained.  Outpatient PT recommended Living condition: Lives at home Goals of care:   Code Status: Full Code  Nutritional status: Body mass index is 20.47 kg/m.      Diet:  Diet Order             Diet regular Room service appropriate? Yes; Fluid consistency: Thin; Fluid restriction: 1200 mL Fluid  Diet effective now           Diet - low sodium heart healthy                  DVT prophylaxis:  heparin injection 5,000 Units Start: 11/17/21 0930 SCDs Start: 11/14/21 6734 Place TED hose Start: 11/14/21 0137   Antimicrobials: None Fluid: None Family Communication: None at bedside  Status is: Inpatient  Continue in-hospital care because: Not stable from medical standpoint for discharge. Level of care: Med-Surg               Infusions:   azithromycin     cefTRIAXone (ROCEPHIN)  IV 1 g (11/20/21 1415)   niCARDipine Stopped (11/14/21 1033)    Scheduled Meds:  Chlorhexidine Gluconate Cloth  6 each Topical Daily   docusate sodium  100 mg Oral BID   fluticasone furoate-vilanterol  1 puff Inhalation Daily   guaiFENesin  600 mg Oral BID   heparin injection (subcutaneous)  5,000 Units Subcutaneous Q8H   loratadine  10 mg Oral Daily   montelukast  10 mg Oral QHS   mupirocin cream  1 application Topical BID   nicotine  21 mg Transdermal Daily   sodium chloride  1 g Oral BID WC   umeclidinium bromide  1 puff Inhalation Daily    PRN meds: acetaminophen **OR** acetaminophen, albuterol, HYDROcodone-acetaminophen, HYDROmorphone (DILAUDID) injection, labetalol, meclizine, ondansetron **OR** ondansetron (ZOFRAN) IV, polyethylene glycol, promethazine   Antimicrobials: Anti-infectives (From admission, onward)    Start     Dose/Rate Route Frequency Ordered Stop   11/20/21 1415  cefTRIAXone (ROCEPHIN) 1 g in sodium chloride 0.9 % 100 mL IVPB        1 g 200 mL/hr over 30 Minutes  Intravenous Every 24 hours 11/20/21 1315     11/20/21 1415  azithromycin (ZITHROMAX) 500 mg in sodium chloride 0.9 % 250 mL IVPB        500 mg 250 mL/hr over 60 Minutes Intravenous Every 24 hours 11/20/21 1315     11/15/21 0817  ceFAZolin (ANCEF) 2-4 GM/100ML-% IVPB       Note to Pharmacy: [employer] A: cabinet override      11/15/21 0817 11/15/21 0921   11/15/21 0800  ceFAZolin (ANCEF) IVPB 2g/100 mL premix        2 g 200 mL/hr over 30 Minutes Intravenous To [facility] 11/15/21 0006 11/15/21 0928       Objective: Vitals:   11/20/21 1248 11/20/21 1438  BP:    Pulse:    Resp: (!) 22   Temp: (!) 100.8 F (38.2 C) 99.6 F (37.6 C)  SpO2: 98%     Intake/Output Summary (Last 24 hours) at 11/20/2021 1609 Last data filed at 11/19/2021 1737 Gross per 24 hour  Intake  0 ml  Output --  Net 0 ml   Filed Weights   11/13/21 1142  Weight: 54.1 kg   Weight change:  Body mass index is 20.47 kg/m.   Physical Exam: General exam: Pleasant, middle-aged Caucasian female.  Not in physical distress Skin: No rashes, lesions or ulcers. HEENT: Atraumatic, normocephalic, no obvious bleeding Lungs: Clear to auscultation bilaterally CVS: Regular rate and rhythm, no murmur GI/Abd soft, nontender, nondistended, bowel sound present CNS: Alert, awake abound x3 Psychiatry: Mood appropriate Extremities: No pedal edema, no calf tenderness  Data Review: I have personally reviewed the laboratory data and studies available.  F/u labs ordered Unresulted Labs (From admission, onward)     Start     Ordered   11/21/21 8372  Basic metabolic panel  Daily,   R     Question:  Specimen collection method  Answer:  Lab=Lab collect   11/20/21 1158   11/21/21 0500  CBC with Differential/Platelet  Daily,   R     Question:  Specimen collection method  Answer:  Lab=Lab collect   11/20/21 1158   11/21/21 0500  Procalcitonin  Daily,   R     Question:  Specimen collection method  Answer:  Lab=Lab  collect   11/20/21 1310   11/20/21 1312  Culture, blood (routine x 2)  BLOOD CULTURE X 2,   R      11/20/21 1314   11/20/21 0840  Osmolality, urine  Once,   R        11/20/21 0839            Signed, Terrilee Croak, MD [facility] 11/20/2021

## 2021-11-20 NOTE — TOC Progression Note (Signed)
Transition of Care [facility]) - Progression Note    Patient Details  Name: Tanya Jordan MRN: [id number] Date of Birth: [birth year]  Transition of [facility]) CM/SW Contact  Pollie Friar, RN Phone Number: 11/20/2021, 3:10 PM  Clinical Narrative:    CM spoke to daughter, Olivia Mackie and patient will be switching to [employer] on Jan 1st. CM has updated [place].  TOC following.   Expected Discharge Plan: [place] Barriers to Discharge: No Barriers Identified  Expected Discharge Plan and Services Expected Discharge Plan: [place]   Discharge Planning Services: CM Consult Post Acute Care Choice: Home Health   Expected Discharge Date: 11/19/21                         HH Arranged: PT, OT, RN HH Agency: [facility] Date [place]: 11/19/21 Time [place]: [phone] Representative spoke with at [place]: [place] ([place]) Interventions    Readmission Risk Interventions No flowsheet data found.

## 2021-11-20 NOTE — Progress Notes (Signed)
Patient complaining of slight pain at the Right surgical site behind ear. Was given pain medicine and ceased of complaint. Upon reassessment, patient has swelling at left side of neck. Non-tender, warm, no redness at this time.

## 2021-11-20 NOTE — Progress Notes (Addendum)
Pt with new onset A.Fib: actually going in and out of A.Flutter 2:1, rate 145 when in A.Flutter.  Pt seen at bedside denies CP, SOB.  SBP 127 currently.  1) Starting cardizem gtt no bolus to try and rate control. 2) changing level of care from med-surg to tele (pt already on 3w progressive and already on tele monitor, so doesn't need to physically change location) 3) DCing Cardene gtt and PRN labetalol which was still on MAR but pt hadnt been on in several days anyhow. 4) Pt already on subq heparin for DVT ppx, will leave this alone, but wont start any new full dose anticoagulation at this time given recent craniotomy with surgical resection of bleeding met.  Will defer any AC decisions to day team. 5) suspect new onset A.Fib/flutter is provoked by the new fevers shes been having today, currently on ABx treatment for suspected PNA.

## 2021-11-21 ENCOUNTER — Encounter: Payer: Self-pay | Admitting: Radiation Therapy

## 2021-11-21 LAB — CBC WITH DIFFERENTIAL/PLATELET
Abs Immature Granulocytes: 0.1 10*3/uL — ABNORMAL HIGH (ref 0.00–0.07)
Basophils Absolute: 0 10*3/uL (ref 0.0–0.1)
Basophils Relative: 0 %
Eosinophils Absolute: 0.3 10*3/uL (ref 0.0–0.5)
Eosinophils Relative: 2 %
HCT: 29.8 % — ABNORMAL LOW (ref 36.0–46.0)
Hemoglobin: 10 g/dL — ABNORMAL LOW (ref 12.0–15.0)
Immature Granulocytes: 1 %
Lymphocytes Relative: 7 %
Lymphs Abs: 0.9 10*3/uL (ref 0.7–4.0)
MCH: 29.1 pg (ref 26.0–34.0)
MCHC: 33.6 g/dL (ref 30.0–36.0)
MCV: 86.6 fL (ref 80.0–100.0)
Monocytes Absolute: 0.9 10*3/uL (ref 0.1–1.0)
Monocytes Relative: 8 %
Neutro Abs: 10 10*3/uL — ABNORMAL HIGH (ref 1.7–7.7)
Neutrophils Relative %: 82 %
Platelets: 260 10*3/uL (ref 150–400)
RBC: 3.44 MIL/uL — ABNORMAL LOW (ref 3.87–5.11)
RDW: 12.9 % (ref 11.5–15.5)
WBC: 12.1 10*3/uL — ABNORMAL HIGH (ref 4.0–10.5)
nRBC: 0 % (ref 0.0–0.2)

## 2021-11-21 LAB — BASIC METABOLIC PANEL
Anion gap: 10 (ref 5–15)
BUN: 8 mg/dL (ref 6–20)
CO2: 23 mmol/L (ref 22–32)
Calcium: 7.8 mg/dL — ABNORMAL LOW (ref 8.9–10.3)
Chloride: 97 mmol/L — ABNORMAL LOW (ref 98–111)
Creatinine, Ser: 0.55 mg/dL (ref 0.44–1.00)
GFR, Estimated: >60 mL/min (ref >60)
Glucose, Bld: 80 mg/dL (ref 70–99)
Potassium: 3.7 mmol/L (ref 3.5–5.1)
Sodium: 130 mmol/L — ABNORMAL LOW (ref 135–145)

## 2021-11-21 LAB — MAGNESIUM: Magnesium: 2 mg/dL (ref 1.7–2.4)

## 2021-11-21 LAB — PROCALCITONIN: Procalcitonin: 0.34 ng/mL

## 2021-11-21 MED ORDER — METOPROLOL TARTRATE 12.5 MG HALF TABLET
12.5000 mg | ORAL_TABLET | Freq: Two times a day (BID) | ORAL | Status: DC
  Administered 2021-11-21 – 2021-11-22 (×3): 12.5 mg via ORAL
  Filled 2021-11-21 (×3): qty 1

## 2021-11-21 NOTE — Progress Notes (Signed)
Occupational Therapy Treatment Patient Details Name: Tanya Jordan MRN: [id number] DOB: 1966-04-26 Today's Date: 11/21/2021   History of present illness 55 y.o. 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 12/14 s/p craniotomy for tumor resection, 12/15 MRI w/ GTR   OT comments  Tanya Jordan is progressing well. O2 removed at the start of the session, SpO2 >90% throughout all tasks and at 95% at the end of the session, RN notified. Pt denied dizziness with change in position this session but continues to report intermittent DV. She complete transfers, mobility and Adls at Tanya supervision- min guard level. She has great safety awareness and problem solving. D/c plan remains appropriate. Ot to continue to follow acutely.   Recommendations for follow up therapy are one component of Tanya multi-disciplinary discharge planning process, led by the attending physician.  Recommendations may be updated based on patient status, additional functional criteria and insurance authorization.    Follow Up Recommendations  Outpatient OT    Assistance Recommended at Discharge Intermittent Supervision/Assistance  Equipment Recommendations  BSC/3in1       Precautions / Restrictions Precautions Precautions: Fall Precaution Comments: denies dizzines this session Restrictions Weight Bearing Restrictions: No       Mobility Bed Mobility Overal bed mobility: Modified Independent                  Transfers Overall transfer level: Needs assistance Equipment used: None Transfers: Sit to/from Stand Sit to Stand: Supervision                 Balance Overall balance assessment: Needs assistance Sitting-balance support: Feet supported Sitting balance-Leahy Scale: Good       Standing balance-Leahy Scale: Good                             ADL either performed or  assessed with clinical judgement   ADL Overall ADL's : Needs assistance/impaired     Grooming: Wash/dry hands;Supervision/safety;Standing                   Toilet Transfer: Min guard;Ambulation Toilet Transfer Details (indicate cue type and reason): pt squatted over toilet to pee, impressive. Toileting- Clothing Manipulation and Hygiene: Supervision/safety;Sit to/from stand       Functional mobility during ADLs: Min guard General ADL Comments: pt without AD this session, no LOB, goo safety awareness. denies dizziness. states she "sometimes" still gets DV    Extremity/Trunk Assessment Upper Extremity Assessment Upper Extremity Assessment: Overall WFL for tasks assessed   Lower Extremity Assessment Lower Extremity Assessment: Defer to PT evaluation        Vision   Vision Assessment?: Yes Eye Alignment: Within Functional Limits Ocular Range of Motion: Within Functional Limits Alignment/Gaze Preference: Within Defined Limits Tracking/Visual Pursuits: Decreased smoothness of horizontal tracking;Decreased smoothness of vertical tracking Saccades: Additional eye shifts occurred during testing;Decreased speed of saccadic movement Visual Fields: No apparent deficits Depth Perception: Overshoots          Cognition Arousal/Alertness: Awake/alert Behavior During Therapy: WFL for tasks assessed/performed Overall Cognitive Status: Within Functional Limits for tasks assessed         General Comments: Pt very HOH                General Comments SpO2 removed for entire session SpO2 >90%, left sitting in the chair with SpO2 at  95%. left O2 off, Rn notified    Pertinent Vitals/ Pain       Pain Assessment: Faces Faces Pain Scale: Hurts Tanya little bit Pain Location: headache Pain Descriptors / Indicators: Headache Pain Intervention(s): Monitored during session   Frequency  Min 2X/week        Progress Toward Goals  OT Goals(current goals can now be found in the  care plan section)  Progress towards OT goals: Progressing toward goals  Acute Rehab OT Goals OT Goal Formulation: With patient Time For Goal Achievement: 11/30/21 Potential to Achieve Goals: Good ADL Goals Pt Will Perform Lower Body Bathing: with modified independence Pt Will Perform Lower Body Dressing: with modified independence Pt Will Transfer to Toilet: with modified independence Additional ADL Goal #1: Pt will independently verbalize 3 strategies to compensate for low vision Additional ADL Goal #2: Pt will independently state 3 strategies to reduce risk of falls  Plan Discharge plan remains appropriate       AM-PAC OT "6 Clicks" Daily Activity     Outcome Measure   Help from another person eating meals?: None Help from another person taking care of personal grooming?: Tanya Little Help from another person toileting, which includes using toliet, bedpan, or urinal?: Tanya Little Help from another person bathing (including washing, rinsing, drying)?: Tanya Little Help from another person to put on and taking off regular upper body clothing?: Tanya Little Help from another person to put on and taking off regular lower body clothing?: Tanya Little 6 Click Score: 19    End of Session Equipment Utilized During Treatment: Gait belt  OT Visit Diagnosis: Unsteadiness on feet (R26.81);Other abnormalities of gait and mobility (R26.89);History of falling (Z91.81);Low vision, both eyes (H54.2);Other symptoms and signs involving the nervous system (R29.898);Dizziness and giddiness (R42);Pain   Activity Tolerance Patient tolerated treatment well   Patient Left in bed;with call bell/phone within reach;with bed alarm set   Nurse Communication Mobility status        Time: 9323-5573 OT Time Calculation (min): 21 min  Charges: OT General Charges $OT Visit: 1 Visit OT Treatments $Self Care/Home Management : 8-22 mins  Tanya Jordan Tanya Jordan 11/21/2021, 5:14 PM

## 2021-11-21 NOTE — Progress Notes (Addendum)
PROGRESS NOTE  Tanya Jordan  DOB: 07-Mar-1966  PCP: Charlott Rakes, MD WER:[id number]  DOA: 11/13/2021  LOS: 8 days  Hospital Day: 9  Chief Complaint  Patient presents with   Emesis   Brief narrative: Tanya Jordan is a 55 y.o. female with PMH significant for HTN, COPD, chronic smoking Patient presented to the ED on 12/12 with complaint of bilateral ear fullness and loss of hearing, global headache, nausea vomiting. Imaging in the ED with chest x-ray and CT scan showed a right upper lobe 2.5 centimeter spiculated solid nodule concerning for lung CA.   CT head and MRI brain showed 3 x 2 cm intraparenchymal hemorrhage in the right cerebellar hemisphere, suspicious for metastatic disease.  Patient was admitted to neurosurgery service. 12/14, patient underwent right retrosigmoid craniotomy for tumor resection  Postoperatively, patient was placed on Cardene drip for blood pressure control.  He was taken off on the night of 12/17. Next day on 12/18, while standing up, patient felt lightheaded and blurry vision. Orthostatic vital signs positive with a blood pressure dropped from 146/88 while lying down to 101/71 while standing up. Hospitalist service was consulted.  Subjective: Patient was seen and examined this morning.   Propped up in bed.  Not in distress.  Mother at bedside.   On 3 L oxygen by nasal cannula.   Coughing a little bit  No fever last 24 hours  Even for last night noted, patient was on A. fib for short duration and required Cardizem drip which was ultimately stopped.   Labs this morning with sodium level slightly up to 130.  Assessment/Plan: Postobstructive pneumonia Acute respiratory failure with hypoxia -Manifested as fever, hypoxia, leukocytosis, increased procalcitonin level.   -Chest x-ray on 12/19 showed right upper lobe pneumonia likely secondary to obstruction from right lung cancer  -Currently on IV Rocephin and IV azithromycin. -Clinically improving.   On 2 to 3 L oxygen by nasal cannula currently.  Check ambulatory oxygen requirement Recent Labs  Lab 11/19/21 1449 11/20/21 1330 11/21/21 0328  WBC 8.8 12.2* 12.1*  LATICACIDVEN 0.7 0.6  --   PROCALCITON  --  0.29 0.34   Orthostatic hypotension History of hypertension -Multifactorial: Poor oral intake, use of pain medicines, bedbound status for 4 days after craniotomy with tumor resection -TSH and cortisol levels normal. -Patient does not have any ongoing fluid loss with diarrhea, vomiting. -Orthostatic vital signs improved after adequate hydration. -Prior to admission, patient was on clonidine and amlodipine.  -Blood pressure medicines currently on hold.  Blood pressure consistently less than 195 systolic.    New diagnosis of primary lung cancer Hemorrhagic metastatic brain tumor -Imaging findings as above. -12/14, patient underwent right retrosigmoid craniotomy for tumor resection -Surgical pathology report from craniotomy tumor resection with immunohistochemical stains suggestive of primary lung cancer. -I discussed with oncology Dr. Lindi Adie on 12/19.  Patient will follow up with oncology Dr. Earlie Server at cancer center.  New onset A. Fib -Last night, patient was in A. fib with RVR with heart rate up to 140s.  Rhythm confirmed by EKG.  She was started on Cardizem drip but it seems she converted back to normal sinus rhythm in a short interval.  Heart rate in 90s this morning.  Lopressor 12.5 mg twice daily started this morning.   -Because of recent brain surgery and ongoing malignancy, I would not start anticoagulation.  Hyponatremia -Sodium level was the lowest at 118.  Gradually increased but fluctuating, 130 today. 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  We will obtain serum osmolality and urine osmolality. -Continue 1200 mL/day fluid restriction.  Avoid diuretics because of recent orthostatic hypotension.   Recent Labs  Lab 11/15/21 1502  11/15/21 1730 11/15/21 2150 11/16/21 0135 11/16/21 0544 11/17/21 0244 11/19/21 1449 11/19/21 2246 11/20/21 1330 11/21/21 0328  NA 131* 132* 133* 133* 135 132* 129* 124* 129* 130*    COPD -Stable, no symptoms or signs of acute exacerbation.  Mobility: PT evaluation was obtained.  Outpatient PT recommended Living condition: Lives at home Goals of care:   Code Status: Full Code  Nutritional status: Body mass index is 20.47 kg/m.      Diet:  Diet Order             Diet regular Room service appropriate? Yes; Fluid consistency: Thin; Fluid restriction: 1200 mL Fluid  Diet effective now           Diet - low sodium heart healthy                  DVT prophylaxis:  heparin injection 5,000 Units Start: 11/17/21 0930 SCDs Start: 11/14/21 2725 Place TED hose Start: 11/14/21 0137   Antimicrobials: None Fluid: None Family Communication: None at bedside  Status is: Inpatient  Continue in-hospital care because: Not stable from medical standpoint for discharge.   Level of care: Telemetry Cardiac               Infusions:   azithromycin 500 mg (11/20/21 1636)   cefTRIAXone (ROCEPHIN)  IV 1 g (11/21/21 1343)   diltiazem (CARDIZEM) infusion Stopped (11/21/21 0300)    Scheduled Meds:  docusate sodium  100 mg Oral BID   fluticasone furoate-vilanterol  1 puff Inhalation Daily   guaiFENesin  600 mg Oral BID   heparin injection (subcutaneous)  5,000 Units Subcutaneous Q8H   loratadine  10 mg Oral Daily   metoprolol tartrate  12.5 mg Oral BID   montelukast  10 mg Oral QHS   mupirocin cream  1 application Topical BID   nicotine  21 mg Transdermal Daily   sodium chloride  1 g Oral BID WC   umeclidinium bromide  1 puff Inhalation Daily    PRN meds: acetaminophen **OR** acetaminophen, albuterol, HYDROcodone-acetaminophen, HYDROmorphone (DILAUDID) injection, meclizine, ondansetron **OR** ondansetron (ZOFRAN) IV, polyethylene glycol, promethazine    Antimicrobials: Anti-infectives (From admission, onward)    Start     Dose/Rate Route Frequency Ordered Stop   11/20/21 1415  cefTRIAXone (ROCEPHIN) 1 g in sodium chloride 0.9 % 100 mL IVPB        1 g 200 mL/hr over 30 Minutes Intravenous Every 24 hours 11/20/21 1315     11/20/21 1415  azithromycin (ZITHROMAX) 500 mg in sodium chloride 0.9 % 250 mL IVPB        500 mg 250 mL/hr over 60 Minutes Intravenous Every 24 hours 11/20/21 1315     11/15/21 0817  ceFAZolin (ANCEF) 2-4 GM/100ML-% IVPB       Note to Pharmacy: [employer] A: cabinet override      11/15/21 0817 11/15/21 0921   11/15/21 0800  ceFAZolin (ANCEF) IVPB 2g/100 mL premix        2 g 200 mL/hr over 30 Minutes Intravenous To [facility] 11/15/21 0006 11/15/21 0928       Objective: Vitals:   11/21/21 0800 11/21/21 0823  BP:  (!) 139/92  Pulse:  95  Resp:  (!) 23  Temp:  97.8 F (36.6  C)  SpO2: 98% 100%    Intake/Output Summary (Last 24 hours) at 11/21/2021 1358 Last data filed at 11/21/2021 1220 Gross per 24 hour  Intake 723.18 ml  Output --  Net 723.18 ml   Filed Weights   11/13/21 1142  Weight: 54.1 kg   Weight change:  Body mass index is 20.47 kg/m.   Physical Exam: General exam: Pleasant, middle-aged Caucasian female.  Not in physical distress Skin: No rashes, lesions or ulcers. HEENT: Atraumatic, normocephalic, no obvious bleeding Lungs: Clear to auscultation bilaterally CVS: Regular rate and rhythm, no murmur GI/Abd soft, nontender, nondistended, bowel sound present CNS: Alert, awake abound x3 Psychiatry: Mood appropriate Extremities: No pedal edema, no calf tenderness  Data Review: I have personally reviewed the laboratory data and studies available.  F/u labs ordered Unresulted Labs (From admission, onward)     Start     Ordered   11/21/21 4888  Basic metabolic panel  Daily,   R     Question:  Specimen collection method  Answer:  Lab=Lab collect   11/20/21 1158   11/21/21  0500  CBC with Differential/Platelet  Daily,   R     Question:  Specimen collection method  Answer:  Lab=Lab collect   11/20/21 1158   11/21/21 0500  Procalcitonin  Daily,   R     Question:  Specimen collection method  Answer:  Lab=Lab collect   11/20/21 1310            Signed, Terrilee Croak, MD [facility] 11/21/2021

## 2021-11-21 NOTE — Progress Notes (Signed)
Pt's cardene gtt paused at 0300, per Dr. Alcario Drought.

## 2021-11-21 NOTE — Significant Event (Signed)
[facility] floor coverage:  Called by Nurse: "on bedside report patient was having bloody sputum RN states this new however patient states this has been happing since day 1 I found no documentation from MD or RN she is getting 5000 unit Heparin sq BID lung assessment dimished but no rhonchi she does have congested cough. My concern is should she continue to get the Heparin"  Concern of course is bleeding from known lung cancer.  Is at higher risk for DVT/PE in setting of lung CA, but no DVT/PE evidence on recent CT chest with contrast a couple of days ago.  Will go ahead and hold heparin [place] for tonight and use SCDs instead.

## 2021-11-21 NOTE — Progress Notes (Signed)
[facility] chat concerning bloody sputum and Heparin SQ injections vitals in EPIC

## 2021-11-21 NOTE — Progress Notes (Signed)
Physical Therapy Treatment Patient Details Name: Tanya Jordan MRN: [id number] DOB: 04/28/1966 Today's Date: 11/21/2021   History of Present Illness 55 y.o. 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 12/14 s/p craniotomy for tumor resection, 12/15 MRI w/ GTR    PT Comments    Pt received in chair, c/o headache but agreeable to therapy session, pt with flat affect. Pt reports "mild" dizziness with seated/standing tasks but no overt LOB for short gait trials x2 in room without AD and min guard for safety. Orthostatic BP Sitting 103/64 (77) HR 94  Standing 84/54 (64) HR 105  Sitting (after gait trial) 99/63 (76) HR 106 bpm  Pt may benefit from TED hose trial to see if it helps with hemodynamics. Per family member in room she "hasn't been eating well", encouraged her to [place] well as able and continue repositioning frequently to reduce risk of pressure injury. Pt continues to benefit from PT services to progress toward functional mobility goals.     Recommendations for follow up therapy are one component of a multi-disciplinary discharge planning process, led by the attending physician.  Recommendations may be updated based on patient status, additional functional criteria and insurance authorization.  Follow Up Recommendations  Outpatient PT     Assistance Recommended at Discharge Frequent or constant Supervision/Assistance  Equipment Recommendations  Rolling walker (2 wheels)    Recommendations for Other Services       Precautions / Restrictions Precautions Precautions: Fall Precaution Comments: ongoing orthostatic hypotension Restrictions Weight Bearing Restrictions: No     Mobility  Bed Mobility Overal bed mobility: Modified Independent     General bed mobility comments: received in chair    Transfers Overall transfer level: Needs  assistance Equipment used: None Transfers: Sit to/from Stand Sit to Stand: Supervision   General transfer comment: able to stand with arms crossed at chest x5 reciprocal reps x2 sets from chair    Ambulation/Gait Ambulation/Gait assistance: Min guard Gait Distance (Feet): 30 Feet (x2 with seated break) Assistive device: None Gait Pattern/deviations: Step-through pattern;Decreased stride length;Drifts right/left       General Gait Details: min guard for safety, no LOB, mild dizziness throughout and defer longer due to orthostasis and continued soft BPs    Modified Rankin (Stroke Patients Only) Modified Rankin (Stroke Patients Only) Pre-Morbid Rankin Score: No symptoms Modified Rankin: Moderate disability     Balance Overall balance assessment: Needs assistance Sitting-balance support: Feet supported Sitting balance-Leahy Scale: Good       Standing balance-Leahy Scale: Good          Cognition Arousal/Alertness: Awake/alert Behavior During Therapy: WFL for tasks assessed/performed;Flat affect Overall Cognitive Status: Within Functional Limits for tasks assessed     General Comments: Pt very HOH        Exercises Other Exercises Other Exercises: STS x 5 reps x2 sets arms crossed    General Comments General comments (skin integrity, edema, etc.): SpO2 WFL on RA, one episode of desat however noisy pleth signal, once wire readjusted reading >93% on RA. Pt frequently coughing with blood tinged secretions;      Pertinent Vitals/Pain Pain Assessment: Faces Faces Pain Scale: Hurts a little bit Pain Location: headache Pain Descriptors / Indicators: Headache Pain Intervention(s): Limited activity within patient's tolerance;Monitored during session;Premedicated before session     PT Goals (current goals can now be found in the care plan section) Acute  Rehab PT Goals Patient Stated Goal: to be able to go home/back to work PT Goal Formulation: With patient Time For  Goal Achievement: 11/24/2021 Progress towards PT goals: Progressing toward goals    Frequency    Min 4X/week      PT Plan Current plan remains appropriate       AM-PAC PT "6 Clicks" Mobility   Outcome Measure  Help needed turning from your back to your side while in a flat bed without using bedrails?: None Help needed moving from lying on your back to sitting on the side of a flat bed without using bedrails?: None Help needed moving to and from a bed to a chair (including a wheelchair)?: A Little Help needed standing up from a chair using your arms (e.g., wheelchair or bedside chair)?: A Little Help needed to walk in hospital room?: A Little Help needed climbing 3-5 steps with a railing? : A Little 6 Click Score: 20    End of Session Equipment Utilized During Treatment: Gait belt Activity Tolerance: Patient tolerated treatment well;Treatment limited secondary to medical complications (Comment);Other (comment) (soft BP and orthostatic initially, gait distance therefore limited for pt safety) Patient left: in chair;with call bell/phone within reach Nurse Communication: Mobility status;Other (comment) (pt soft BP, requesting different meal (her family member getting something for her from cafeteria)) PT Visit Diagnosis: Muscle weakness (generalized) (M62.81);Difficulty in walking, not elsewhere classified (R26.2)     Time: 2505-3976 PT Time Calculation (min) (ACUTE ONLY): 25 min  Charges:  $Gait Training: 8-22 mins $Therapeutic Activity: 8-22 mins                     Chales Pelissier P., PTA Acute Rehabilitation Services Pager: [phone] Office: [place] 11/21/2021, 5:40 PM

## 2021-11-21 NOTE — Progress Notes (Signed)
Pt's case was reviewed during the brain and spine [place] [facility] Conference on 12/19. Dr. Zada Finders would like to offer post operative radiosurgery to treat the cavity once she has recovered.   The thoracic team is aware of her admission and new primary lung diagnosis, she will also be presented in the upcoming lung [place] tumor board for review and treatment recommendations with Dr. Julien Nordmann.   I will reach out to the patient once her discharge plans have been confirmed to set up planning appointments for her radiosurgery.   Mont Dutton R.T.(R)(T) Radiation Special Procedures Navigator  [phone]

## 2021-11-21 NOTE — Plan of Care (Signed)
Pt has 1 prn pain medication and it has helped throuout the day. She c/o back pain. Pt has been in the chair and walked the room. O2 was not needed when doing so. BP did drop with PT when pt was standing and she was a little dizzy.   Problem: Education: Goal: Knowledge of General Education information will improve Description: Including pain rating scale, medication(s)/side effects and non-pharmacologic comfort measures Outcome: Progressing   Problem: Health Behavior/Discharge Planning: Goal: Ability to manage health-related needs will improve Outcome: Progressing   Problem: Clinical Measurements: Goal: Ability to maintain clinical measurements within normal limits will improve Outcome: Progressing Goal: Will remain free from infection Outcome: Progressing Goal: Diagnostic test results will improve Outcome: Progressing Goal: Respiratory complications will improve Outcome: Progressing Goal: Cardiovascular complication will be avoided Outcome: Progressing   Problem: Activity: Goal: Risk for activity intolerance will decrease Outcome: Progressing   Problem: Nutrition: Goal: Adequate nutrition will be maintained Outcome: Progressing   Problem: Coping: Goal: Level of anxiety will decrease Outcome: Progressing   Problem: Elimination: Goal: Will not experience complications related to bowel motility Outcome: Progressing Goal: Will not experience complications related to urinary retention Outcome: Progressing   Problem: Pain Managment: Goal: General experience of comfort will improve Outcome: Progressing   Problem: Safety: Goal: Ability to remain free from injury will improve Outcome: Progressing   Problem: Skin Integrity: Goal: Risk for impaired skin integrity will decrease Outcome: Progressing

## 2021-11-22 ENCOUNTER — Other Ambulatory Visit: Payer: Self-pay | Admitting: Radiation Therapy

## 2021-11-22 ENCOUNTER — Other Ambulatory Visit: Payer: Self-pay

## 2021-11-22 ENCOUNTER — Telehealth: Payer: Self-pay | Admitting: Internal Medicine

## 2021-11-22 DIAGNOSIS — C7931 Secondary malignant neoplasm of brain: Secondary | ICD-10-CM

## 2021-11-22 LAB — BASIC METABOLIC PANEL
Anion gap: 9 (ref 5–15)
BUN: 8 mg/dL (ref 6–20)
CO2: 24 mmol/L (ref 22–32)
Calcium: 7.8 mg/dL — ABNORMAL LOW (ref 8.9–10.3)
Chloride: 95 mmol/L — ABNORMAL LOW (ref 98–111)
Creatinine, Ser: 0.55 mg/dL (ref 0.44–1.00)
GFR, Estimated: >60 mL/min (ref >60)
Glucose, Bld: 98 mg/dL (ref 70–99)
Potassium: 3.3 mmol/L — ABNORMAL LOW (ref 3.5–5.1)
Sodium: 128 mmol/L — ABNORMAL LOW (ref 135–145)

## 2021-11-22 LAB — CBC WITH DIFFERENTIAL/PLATELET
Abs Immature Granulocytes: 0.06 10*3/uL (ref 0.00–0.07)
Basophils Absolute: 0 10*3/uL (ref 0.0–0.1)
Basophils Relative: 0 %
Eosinophils Absolute: 0.3 10*3/uL (ref 0.0–0.5)
Eosinophils Relative: 3 %
HCT: 23.8 % — ABNORMAL LOW (ref 36.0–46.0)
Hemoglobin: 8.1 g/dL — ABNORMAL LOW (ref 12.0–15.0)
Immature Granulocytes: 1 %
Lymphocytes Relative: 9 %
Lymphs Abs: 0.9 10*3/uL (ref 0.7–4.0)
MCH: 29.2 pg (ref 26.0–34.0)
MCHC: 34 g/dL (ref 30.0–36.0)
MCV: 85.9 fL (ref 80.0–100.0)
Monocytes Absolute: 0.8 10*3/uL (ref 0.1–1.0)
Monocytes Relative: 8 %
Neutro Abs: 8.5 10*3/uL — ABNORMAL HIGH (ref 1.7–7.7)
Neutrophils Relative %: 79 %
Platelets: 227 10*3/uL (ref 150–400)
RBC: 2.77 MIL/uL — ABNORMAL LOW (ref 3.87–5.11)
RDW: 12.9 % (ref 11.5–15.5)
WBC: 10.6 10*3/uL — ABNORMAL HIGH (ref 4.0–10.5)
nRBC: 0 % (ref 0.0–0.2)

## 2021-11-22 LAB — PROCALCITONIN: Procalcitonin: 13.05 ng/mL

## 2021-11-22 MED ORDER — CEFDINIR 300 MG PO CAPS
300.0000 mg | ORAL_CAPSULE | Freq: Two times a day (BID) | ORAL | Status: DC
  Administered 2021-11-22: 13:00:00 300 mg via ORAL
  Filled 2021-11-22 (×2): qty 1

## 2021-11-22 MED ORDER — CEFDINIR 300 MG PO CAPS
300.0000 mg | ORAL_CAPSULE | Freq: Two times a day (BID) | ORAL | 0 refills | Status: DC
  Filled 2021-11-22: qty 14, 7d supply, fill #0

## 2021-11-22 MED ORDER — METOPROLOL TARTRATE 25 MG PO TABS
12.5000 mg | ORAL_TABLET | Freq: Two times a day (BID) | ORAL | 2 refills | Status: DC
  Filled 2021-11-22: qty 30, 30d supply, fill #0

## 2021-11-22 MED ORDER — SODIUM CHLORIDE 1 G PO TABS
1.0000 g | ORAL_TABLET | Freq: Two times a day (BID) | ORAL | 0 refills | Status: DC
  Filled 2021-11-22: qty 14, 7d supply, fill #0

## 2021-11-22 MED ORDER — MECLIZINE HCL 12.5 MG PO TABS
12.5000 mg | ORAL_TABLET | Freq: Three times a day (TID) | ORAL | 0 refills | Status: DC | PRN
Start: 2021-11-22 — End: 2021-12-01
  Filled 2021-11-22: qty 30, 10d supply, fill #0

## 2021-11-22 NOTE — TOC Transition Note (Signed)
Transition of Care [facility]) - CM/SW Discharge Note   Patient Details  Name: Tanya Jordan MRN: [id number] Date of Birth: 10-Oct-1966  [facility] CM/SW Contact:  Pollie Friar, RN Phone Number: 11/22/2021, 12:06 PM   Clinical Narrative:    Patient is discharging home with home health services through [facility]. CM will update Stacey at [facility] on the discharge and orders.  Pt qualified for home oxygen. CM spoke to patients daughter and she had no preference on the agency to supply the oxygen. CM arranged home oxygen through [employer]. She will have oxygen delivered to the room and to the home.  Patient has transportation home.    Final next level of care: [place] Barriers to Discharge: No Barriers Identified   Patient Goals and CMS Choice Patient states their goals for this hospitalization and ongoing recovery are:: to go home CMS Medicare.gov Compare Post Acute Care list provided to:: Patient Represenative (must comment) Choice offered to / list presented to : Adult Children  Discharge Placement                       Discharge Plan and Services   Discharge Planning Services: CM Consult Post Acute Care Choice: Home Health          DME Arranged: Oxygen DME Agency: [employer] Date DME Agency Contacted: 11/22/21   Representative spoke with at DME Agency: Freda Munro HH Arranged: PT, OT, RN [facility] Agency: [place] Date [place]: 11/22/21 Time [place]: [phone] Representative spoke with at [place]: [place] ([place]) Interventions     Readmission Risk Interventions No flowsheet data found.

## 2021-11-22 NOTE — Telephone Encounter (Signed)
Scheduled appt per 12/19 staff msg from [employer]. Pt's daughter is aware of appt date and time. She is also aware to arrive 15 mins prior to appt time.

## 2021-11-22 NOTE — Progress Notes (Signed)
SATURATION QUALIFICATIONS: (This note is used to comply with regulatory documentation for home oxygen)  Patient Saturations on Room Air at Rest = 90%  Patient Saturations on Room Air while Ambulating = 85%  Patient Saturations on 4 Liters of oxygen while Ambulating = >93%  Please briefly explain why patient needs home oxygen: Pt desat to <88% during short gait trial on RA, did not improve >85% until placed on 2L O2 [place], then remained below 89%. Pt needed 4L/min O2 [place] to improve to >90% with ambulation.

## 2021-11-22 NOTE — Addendum Note (Signed)
Addended by: Pincus Large on: 11/22/2021 03:38 PM   Modules accepted: Orders

## 2021-11-22 NOTE — Progress Notes (Signed)
Physical Therapy Treatment Patient Details Name: Tanya Jordan MRN: [id number] DOB: Apr 11, 1966 Today's Date: 11/22/2021   History of Present Illness 55 y.o. 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 12/14 s/p craniotomy for tumor resection, 12/15 MRI w/ GTR    PT Comments    Pt received in supine, sleeping but easily awoken SpO2 89-90% on RA while asleep. Pt agreeable to therapy session, reports her dizziness is "minimal" with standing/gait and able to tolerate short community distance gait trial. Pt needed up to min guard for safety during gait but mostly Supervision and cues for seated break/pursed-lip breathing when she became hypoxic. Of note, pt without significantly increased work of breathing when desaturated below 90%.  Patient Saturations on Room Air at Rest = 90%  Patient Saturations on [employer] while Ambulating = 85% Patient Saturations on 4 Liters of oxygen while Ambulating = >93% Please briefly explain why patient needs home oxygen: Pt desat to <88% during short gait trial on RA, did not improve >85% until placed on 2L O2 [place], then remained below 89%. Pt needed 4L/min O2 [place] to improve to >90% with ambulation. Pt continues to benefit from PT services to progress toward functional mobility goals.   Recommendations for follow up therapy are one component of a multi-disciplinary discharge planning process, led by the attending physician.  Recommendations may be updated based on patient status, additional functional criteria and insurance authorization.  Follow Up Recommendations  Outpatient PT     Assistance Recommended at Discharge Frequent or constant Supervision/Assistance  Equipment Recommendations  Rolling walker (2 wheels) Home O2 equipment   Recommendations for Other Services       Precautions / Restrictions Precautions Precautions:  Fall Restrictions Weight Bearing Restrictions: No     Mobility  Bed Mobility Overal bed mobility: Modified Independent        Transfers Overall transfer level: Needs assistance Equipment used: None Transfers: Sit to/from Stand Sit to Stand: Modified independent (Device/Increase time)     General transfer comment: from EOB and chair no LOB    Ambulation/Gait Ambulation/Gait assistance: Min guard;Supervision Gait Distance (Feet): 200 Feet (149ft, seated break due to desat, 271ft) Assistive device: None Gait Pattern/deviations: Step-through pattern;Decreased stride length       General Gait Details: min guard for safety, no LOB, SpO2 desat on RA see walking saturations above.    Modified Rankin (Stroke Patients Only) Modified Rankin (Stroke Patients Only) Pre-Morbid Rankin Score: No symptoms Modified Rankin: Moderate disability     Balance Overall balance assessment: Needs assistance Sitting-balance support: Feet supported Sitting balance-Leahy Scale: Good   Standing balance-Leahy Scale: Good          Cognition Arousal/Alertness: Awake/alert Behavior During Therapy: WFL for tasks assessed/performed;Flat affect Overall Cognitive Status: Within Functional Limits for tasks assessed         General Comments: Pt very HOH, seems frustrated with prospect of having to use O2 tank at home.        Exercises      General Comments General comments (skin integrity, edema, etc.): BP 121/73 supine, no dizziness seated and only mild dizziness standing, no other s/sx distress. SpO2 desat on RA with exertion to 85%, on 2L improved to 88%, on 4L improved >93%, RN/MD/Case Mgr notified.      Pertinent Vitals/Pain Pain Assessment: Faces Faces Pain Scale: Hurts a little bit Pain Location: back pain "from  the chair and the bed" Pain Intervention(s): Monitored during session;Other (comment) (heels floated in bed, pillow behind L hip in supine at end of session)     PT  Goals (current goals can now be found in the care plan section) Acute Rehab PT Goals Patient Stated Goal: to be able to go home/back to work PT Goal Formulation: With patient Time For Goal Achievement: 11/10/2021 Progress towards PT goals: Progressing toward goals    Frequency    Min 4X/week      PT Plan Current plan remains appropriate       AM-PAC PT "6 Clicks" Mobility   Outcome Measure  Help needed turning from your back to your side while in a flat bed without using bedrails?: None Help needed moving from lying on your back to sitting on the side of a flat bed without using bedrails?: None Help needed moving to and from a bed to a chair (including a wheelchair)?: None Help needed standing up from a chair using your arms (e.g., wheelchair or bedside chair)?: None Help needed to walk in hospital room?: A Little Help needed climbing 3-5 steps with a railing? : A Little 6 Click Score: 22    End of Session Equipment Utilized During Treatment: Gait belt Activity Tolerance: Patient tolerated treatment well Patient left: with call bell/phone within reach;in bed;with bed alarm set Nurse Communication: Mobility status (needs O2 with exertion) PT Visit Diagnosis: Muscle weakness (generalized) (M62.81);Difficulty in walking, not elsewhere classified (R26.2)     Time: 1130-1156 PT Time Calculation (min) (ACUTE ONLY): 26 min  Charges:  $Gait Training: 8-22 mins $Therapeutic Activity: 8-22 mins                     Dayne Chait P., PTA Acute Rehabilitation Services Pager: [phone] Office: [place] 11/22/2021, 1:20 PM

## 2021-11-22 NOTE — Progress Notes (Signed)
PROGRESS NOTE  Tanya Jordan  DOB: [birth year]  PCP: Charlott Rakes, MD HQP:[id number]  DOA: 11/13/2021  LOS: 9 days  Hospital Day: 10  Chief Complaint  Patient presents with   Emesis   Brief narrative: Tanya Jordan is a 55 y.o. female with PMH significant for HTN, COPD, chronic smoking Patient presented to the ED on 12/12 with complaint of bilateral ear fullness and loss of hearing, global headache, nausea vomiting. Imaging in the ED with chest x-ray and CT scan showed a right upper lobe 2.5 centimeter spiculated solid nodule concerning for lung CA.   CT head and MRI brain showed 3 x 2 cm intraparenchymal hemorrhage in the right cerebellar hemisphere, suspicious for metastatic disease.  Patient was admitted to neurosurgery service. 12/14, patient underwent right retrosigmoid craniotomy for tumor resection  Postoperatively, patient was placed on Cardene drip for blood pressure control.  He was taken off on the night of 12/17. Next day on 12/18, while standing up, patient felt lightheaded and blurry vision. Orthostatic vital signs positive with a blood pressure dropped from 146/88 while lying down to 101/71 while standing up. Hospitalist service was consulted.  Subjective: Patient was seen and examined this morning.   Propped up in bed.  Not in distress.   Not on oxygen at rest. Getting intermittent hemoptysis.  Assessment/Plan: Postobstructive pneumonia Acute respiratory failure with hypoxia -Manifested as fever, hypoxia, leukocytosis, increased procalcitonin level.   -Chest x-ray on 12/19 showed right upper lobe pneumonia likely secondary to obstruction from right lung cancer  -Currently clinically improving on IV Rocephin and IV azithromycin.  We will discharge her on 7 more days of oral Omnicef -Not requiring supplemental oxygen at rest.  Required 4 L oxygen by nasal cannula on ambulation.  Home oxygen ordered. Recent Labs  Lab 11/19/21 1449 11/20/21 1330  11/21/21 0328 11/22/21 0318  WBC 8.8 12.2* 12.1* 10.6*  LATICACIDVEN 0.7 0.6  --   --   PROCALCITON  --  0.29 0.34 13.05   Orthostatic hypotension History of hypertension -Multifactorial: Poor oral intake, use of pain medicines, bedbound status for 4 days after craniotomy with tumor resection -TSH and cortisol levels normal. -Patient does not have any ongoing fluid loss with diarrhea, vomiting. -Orthostatic vital signs improved after adequate hydration.  She may still have intermittent dizziness because of cerebellar tumor and resection.  Continue Antivert as needed post discharge. -Prior to admission, patient was on clonidine and amlodipine.  -While in the hospital, her blood pressure has been running consistently less than 140 with metoprolol.  Continue metoprolol discharge.    New diagnosis of primary lung cancer Hemorrhagic metastatic brain tumor -Imaging findings as above. -12/14, patient underwent right retrosigmoid craniotomy for tumor resection -Surgical pathology report from craniotomy tumor resection with immunohistochemical stains suggestive of primary lung cancer. -I discussed with oncology Dr. Lindi Adie on 12/19.  Patient will follow up with oncology Dr. Earlie Server at cancer center. -Patient is having intermittent hemoptysis.  Continue to monitor symptoms at home.  -Recommend to return to ED if she starts throwing up large amount of blood -Patient also complains that she is having hard time hearing since the surgery.  She may have postoperative edema around the nerves leading to that.  Continue to monitor symptoms at home.  If symptoms persist, may need ENT evaluation.  New onset A. Fib -Patient in A. fib the other night while she was hypoxic.  Shortly required Cardizem drip.  Currently in normal sinus rhythm on metoprolol.  Continue the  same at discharge. -Because of recent brain surgery and ongoing malignancy, I would not start anticoagulation.  Hyponatremia -Sodium level was  the lowest at 118. -Serum osmolality low at 266 while urine osmolality elevated to over 600.  -Sodium level gradually increased with sodium tablets and fluid restriction.  Continue the same at home.  Clinically euvolemic.  No need of Lasix. Recent Labs  Lab 11/15/21 1730 11/15/21 2150 11/16/21 0135 11/16/21 0544 11/17/21 0244 11/19/21 1449 11/19/21 2246 11/20/21 1330 11/21/21 0328 11/22/21 0318  NA 132* 133* 133* 135 132* 129* 124* 129* 130* 128*    COPD Current daily smoker -Stable, no symptoms or signs of acute exacerbation. -Counseled to quit smoking  Mobility: PT evaluation was obtained.  Outpatient PT recommended Living condition: Lives at home Goals of care:   Code Status: Full Code  Nutritional status: Body mass index is 20.47 kg/m.      Diet:  Diet Order             Diet general           Diet regular Room service appropriate? Yes; Fluid consistency: Thin; Fluid restriction: 1200 mL Fluid  Diet effective now                  DVT prophylaxis:  Place and maintain sequential compression device Start: 11/21/21 2027 SCDs Start: 11/14/21 0137 Place TED hose Start: 11/14/21 0137   Antimicrobials: None Fluid: None Family Communication: Daughter at bedside today  Status is: Inpatient  Continue in-hospital care because: Medically stable for discharge Level of care: Telemetry Cardiac               Infusions:     Scheduled Meds:  cefdinir  300 mg Oral Q12H   docusate sodium  100 mg Oral BID   fluticasone furoate-vilanterol  1 puff Inhalation Daily   guaiFENesin  600 mg Oral BID   loratadine  10 mg Oral Daily   metoprolol tartrate  12.5 mg Oral BID   montelukast  10 mg Oral QHS   mupirocin cream  1 application Topical BID   nicotine  21 mg Transdermal Daily   sodium chloride  1 g Oral BID WC   umeclidinium bromide  1 puff Inhalation Daily    PRN meds: acetaminophen **OR** acetaminophen, albuterol, HYDROcodone-acetaminophen, meclizine,  ondansetron **OR** ondansetron (ZOFRAN) IV, polyethylene glycol, promethazine   Antimicrobials: Anti-infectives (From admission, onward)    Start     Dose/Rate Route Frequency Ordered Stop   11/22/21 1130  cefdinir (OMNICEF) capsule 300 mg        300 mg Oral Every 12 hours 11/22/21 1044     11/22/21 0000  cefdinir (OMNICEF) 300 MG capsule        300 mg Oral Every 12 hours 11/22/21 1115 11/29/21 2359   11/20/21 1415  cefTRIAXone (ROCEPHIN) 1 g in sodium chloride 0.9 % 100 mL IVPB  Status:  Discontinued        1 g 200 mL/hr over 30 Minutes Intravenous Every 24 hours 11/20/21 1315 11/22/21 1044   11/20/21 1415  azithromycin (ZITHROMAX) 500 mg in sodium chloride 0.9 % 250 mL IVPB  Status:  Discontinued        500 mg 250 mL/hr over 60 Minutes Intravenous Every 24 hours 11/20/21 1315 11/22/21 1044   11/15/21 0817  ceFAZolin (ANCEF) 2-4 GM/100ML-% IVPB       Note to Pharmacy: [employer] A: cabinet override      11/15/21 0817 11/15/21 5277  11/15/21 0800  ceFAZolin (ANCEF) IVPB 2g/100 mL premix        2 g 200 mL/hr over 30 Minutes Intravenous To [facility] 11/15/21 0006 11/15/21 0928       Objective: Vitals:   11/22/21 0858 11/22/21 1212  BP: 126/75 98/74  Pulse: 90 92  Resp: 18 14  Temp: 99.6 F (37.6 C) 99.4 F (37.4 C)  SpO2: 94% 96%   No intake or output data in the 24 hours ending 11/22/21 1255 Filed Weights   11/13/21 1142  Weight: 54.1 kg   Weight change:  Body mass index is 20.47 kg/m.   Physical Exam: General exam: Pleasant, middle-aged Caucasian female.  Not in physical distress Skin: No rashes, lesions or ulcers. HEENT: Atraumatic, normocephalic, no obvious bleeding Lungs: Clear to auscultation bilaterally CVS: Regular rate and rhythm, no murmur GI/Abd soft, nontender, nondistended, bowel sound present CNS: Alert, awake, oriented x3.  Hard of hearing Psychiatry: Mood appropriate Extremities: No pedal edema, no calf tenderness  Data Review: I  have personally reviewed the laboratory data and studies available.  F/u labs ordered Unresulted Labs (From admission, onward)     Start     Ordered   11/21/21 9326  Basic metabolic panel  Daily,   R     Question:  Specimen collection method  Answer:  Lab=Lab collect   11/20/21 1158   11/21/21 0500  CBC with Differential/Platelet  Daily,   R     Question:  Specimen collection method  Answer:  Lab=Lab collect   11/20/21 1158            Signed, Terrilee Croak, MD [facility] 11/22/2021

## 2021-11-25 LAB — CULTURE, BLOOD (ROUTINE X 2)
Culture: NO GROWTH
Culture: NO GROWTH
Special Requests: ADEQUATE
Special Requests: ADEQUATE

## 2021-11-28 ENCOUNTER — Emergency Department (HOSPITAL_COMMUNITY): Payer: 59

## 2021-11-28 ENCOUNTER — Inpatient Hospital Stay (HOSPITAL_COMMUNITY): Payer: 59

## 2021-11-28 ENCOUNTER — Other Ambulatory Visit: Payer: Self-pay

## 2021-11-28 ENCOUNTER — Inpatient Hospital Stay (HOSPITAL_COMMUNITY)
Admission: EM | Admit: 2021-11-28 | Discharge: 2021-12-03 | DRG: 180 | Disposition: E | Payer: 59 | Attending: Internal Medicine | Admitting: Internal Medicine

## 2021-11-28 DIAGNOSIS — R627 Adult failure to thrive: Secondary | ICD-10-CM | POA: Diagnosis present

## 2021-11-28 DIAGNOSIS — C7889 Secondary malignant neoplasm of other digestive organs: Secondary | ICD-10-CM | POA: Diagnosis present

## 2021-11-28 DIAGNOSIS — I480 Paroxysmal atrial fibrillation: Secondary | ICD-10-CM | POA: Diagnosis present

## 2021-11-28 DIAGNOSIS — I10 Essential (primary) hypertension: Secondary | ICD-10-CM | POA: Diagnosis present

## 2021-11-28 DIAGNOSIS — Z86010 Personal history of colonic polyps: Secondary | ICD-10-CM | POA: Diagnosis not present

## 2021-11-28 DIAGNOSIS — Z515 Encounter for palliative care: Secondary | ICD-10-CM | POA: Diagnosis not present

## 2021-11-28 DIAGNOSIS — C771 Secondary and unspecified malignant neoplasm of intrathoracic lymph nodes: Secondary | ICD-10-CM | POA: Diagnosis present

## 2021-11-28 DIAGNOSIS — Z9981 Dependence on supplemental oxygen: Secondary | ICD-10-CM | POA: Diagnosis not present

## 2021-11-28 DIAGNOSIS — E876 Hypokalemia: Secondary | ICD-10-CM | POA: Diagnosis present

## 2021-11-28 DIAGNOSIS — R195 Other fecal abnormalities: Secondary | ICD-10-CM | POA: Diagnosis present

## 2021-11-28 DIAGNOSIS — I629 Nontraumatic intracranial hemorrhage, unspecified: Secondary | ICD-10-CM

## 2021-11-28 DIAGNOSIS — Z789 Other specified health status: Secondary | ICD-10-CM | POA: Diagnosis not present

## 2021-11-28 DIAGNOSIS — J4489 Other specified chronic obstructive pulmonary disease: Secondary | ICD-10-CM | POA: Diagnosis present

## 2021-11-28 DIAGNOSIS — J9610 Chronic respiratory failure, unspecified whether with hypoxia or hypercapnia: Secondary | ICD-10-CM | POA: Diagnosis present

## 2021-11-28 DIAGNOSIS — Z801 Family history of malignant neoplasm of trachea, bronchus and lung: Secondary | ICD-10-CM

## 2021-11-28 DIAGNOSIS — Z7189 Other specified counseling: Secondary | ICD-10-CM | POA: Diagnosis not present

## 2021-11-28 DIAGNOSIS — R531 Weakness: Secondary | ICD-10-CM | POA: Diagnosis not present

## 2021-11-28 DIAGNOSIS — C3490 Malignant neoplasm of unspecified part of unspecified bronchus or lung: Secondary | ICD-10-CM | POA: Diagnosis not present

## 2021-11-28 DIAGNOSIS — R778 Other specified abnormalities of plasma proteins: Secondary | ICD-10-CM | POA: Diagnosis present

## 2021-11-28 DIAGNOSIS — D649 Anemia, unspecified: Secondary | ICD-10-CM | POA: Diagnosis not present

## 2021-11-28 DIAGNOSIS — J9611 Chronic respiratory failure with hypoxia: Secondary | ICD-10-CM | POA: Diagnosis present

## 2021-11-28 DIAGNOSIS — J910 Malignant pleural effusion: Secondary | ICD-10-CM | POA: Diagnosis present

## 2021-11-28 DIAGNOSIS — Z20822 Contact with and (suspected) exposure to covid-19: Secondary | ICD-10-CM | POA: Diagnosis present

## 2021-11-28 DIAGNOSIS — Z833 Family history of diabetes mellitus: Secondary | ICD-10-CM

## 2021-11-28 DIAGNOSIS — J189 Pneumonia, unspecified organism: Secondary | ICD-10-CM | POA: Diagnosis present

## 2021-11-28 DIAGNOSIS — Z6820 Body mass index (BMI) 20.0-20.9, adult: Secondary | ICD-10-CM

## 2021-11-28 DIAGNOSIS — C7971 Secondary malignant neoplasm of right adrenal gland: Secondary | ICD-10-CM | POA: Diagnosis present

## 2021-11-28 DIAGNOSIS — C3411 Malignant neoplasm of upper lobe, right bronchus or lung: Secondary | ICD-10-CM | POA: Diagnosis present

## 2021-11-28 DIAGNOSIS — J449 Chronic obstructive pulmonary disease, unspecified: Secondary | ICD-10-CM | POA: Diagnosis present

## 2021-11-28 DIAGNOSIS — C7931 Secondary malignant neoplasm of brain: Secondary | ICD-10-CM | POA: Diagnosis present

## 2021-11-28 DIAGNOSIS — R300 Dysuria: Secondary | ICD-10-CM | POA: Diagnosis present

## 2021-11-28 DIAGNOSIS — E871 Hypo-osmolality and hyponatremia: Secondary | ICD-10-CM | POA: Diagnosis present

## 2021-11-28 DIAGNOSIS — Z720 Tobacco use: Secondary | ICD-10-CM | POA: Diagnosis present

## 2021-11-28 DIAGNOSIS — F17210 Nicotine dependence, cigarettes, uncomplicated: Secondary | ICD-10-CM | POA: Diagnosis present

## 2021-11-28 DIAGNOSIS — Z8701 Personal history of pneumonia (recurrent): Secondary | ICD-10-CM

## 2021-11-28 DIAGNOSIS — Z66 Do not resuscitate: Secondary | ICD-10-CM | POA: Diagnosis present

## 2021-11-28 DIAGNOSIS — Z803 Family history of malignant neoplasm of breast: Secondary | ICD-10-CM

## 2021-11-28 DIAGNOSIS — R042 Hemoptysis: Secondary | ICD-10-CM | POA: Diagnosis present

## 2021-11-28 DIAGNOSIS — E222 Syndrome of inappropriate secretion of antidiuretic hormone: Secondary | ICD-10-CM | POA: Diagnosis present

## 2021-11-28 DIAGNOSIS — Z888 Allergy status to other drugs, medicaments and biological substances status: Secondary | ICD-10-CM

## 2021-11-28 DIAGNOSIS — D630 Anemia in neoplastic disease: Secondary | ICD-10-CM | POA: Diagnosis present

## 2021-11-28 DIAGNOSIS — R7989 Other specified abnormal findings of blood chemistry: Secondary | ICD-10-CM | POA: Diagnosis present

## 2021-11-28 DIAGNOSIS — R0600 Dyspnea, unspecified: Secondary | ICD-10-CM | POA: Diagnosis not present

## 2021-11-28 DIAGNOSIS — C7972 Secondary malignant neoplasm of left adrenal gland: Secondary | ICD-10-CM | POA: Diagnosis present

## 2021-11-28 DIAGNOSIS — Z79899 Other long term (current) drug therapy: Secondary | ICD-10-CM | POA: Diagnosis not present

## 2021-11-28 DIAGNOSIS — Z8249 Family history of ischemic heart disease and other diseases of the circulatory system: Secondary | ICD-10-CM

## 2021-11-28 DIAGNOSIS — E041 Nontoxic single thyroid nodule: Secondary | ICD-10-CM

## 2021-11-28 LAB — COMPREHENSIVE METABOLIC PANEL
ALT: 16 U/L (ref 0–44)
AST: 18 U/L (ref 15–41)
Albumin: 2.2 g/dL — ABNORMAL LOW (ref 3.5–5.0)
Alkaline Phosphatase: 77 U/L (ref 38–126)
Anion gap: 11 (ref 5–15)
BUN: <5 mg/dL — ABNORMAL LOW (ref 6–20)
CO2: 28 mmol/L (ref 22–32)
Calcium: 8 mg/dL — ABNORMAL LOW (ref 8.9–10.3)
Chloride: 92 mmol/L — ABNORMAL LOW (ref 98–111)
Creatinine, Ser: 0.5 mg/dL (ref 0.44–1.00)
GFR, Estimated: >60 mL/min (ref >60)
Glucose, Bld: 112 mg/dL — ABNORMAL HIGH (ref 70–99)
Potassium: 2.6 mmol/L — CL (ref 3.5–5.1)
Sodium: 131 mmol/L — ABNORMAL LOW (ref 135–145)
Total Bilirubin: 0.7 mg/dL (ref 0.3–1.2)
Total Protein: 5.5 g/dL — ABNORMAL LOW (ref 6.5–8.1)

## 2021-11-28 LAB — CBC WITH DIFFERENTIAL/PLATELET
Abs Immature Granulocytes: 0.09 10*3/uL — ABNORMAL HIGH (ref 0.00–0.07)
Basophils Absolute: 0.1 10*3/uL (ref 0.0–0.1)
Basophils Relative: 0 %
Eosinophils Absolute: 0.3 10*3/uL (ref 0.0–0.5)
Eosinophils Relative: 3 %
HCT: 17.8 % — ABNORMAL LOW (ref 36.0–46.0)
Hemoglobin: 5.7 g/dL — CL (ref 12.0–15.0)
Immature Granulocytes: 1 %
Lymphocytes Relative: 5 %
Lymphs Abs: 0.6 10*3/uL — ABNORMAL LOW (ref 0.7–4.0)
MCH: 28.2 pg (ref 26.0–34.0)
MCHC: 32 g/dL (ref 30.0–36.0)
MCV: 88.1 fL (ref 80.0–100.0)
Monocytes Absolute: 0.5 10*3/uL (ref 0.1–1.0)
Monocytes Relative: 4 %
Neutro Abs: 10.9 10*3/uL — ABNORMAL HIGH (ref 1.7–7.7)
Neutrophils Relative %: 87 %
Platelets: 263 10*3/uL (ref 150–400)
RBC: 2.02 MIL/uL — ABNORMAL LOW (ref 3.87–5.11)
RDW: 14 % (ref 11.5–15.5)
WBC: 12.6 10*3/uL — ABNORMAL HIGH (ref 4.0–10.5)
nRBC: 0 % (ref 0.0–0.2)

## 2021-11-28 LAB — LACTIC ACID, PLASMA: Lactic Acid, Venous: 1.1 mmol/L (ref 0.5–1.9)

## 2021-11-28 LAB — URINALYSIS, ROUTINE W REFLEX MICROSCOPIC
Bilirubin Urine: NEGATIVE
Glucose, UA: NEGATIVE mg/dL
Ketones, ur: 40 mg/dL — AB
Leukocytes,Ua: NEGATIVE
Nitrite: NEGATIVE
Protein, ur: NEGATIVE mg/dL
Specific Gravity, Urine: <1.005 — ABNORMAL LOW (ref 1.005–1.030)
pH: 5.5 (ref 5.0–8.0)

## 2021-11-28 LAB — URINALYSIS, MICROSCOPIC (REFLEX): Bacteria, UA: NONE SEEN

## 2021-11-28 LAB — APTT: aPTT: 30 seconds (ref 24–36)

## 2021-11-28 LAB — RESP PANEL BY RT-PCR (FLU A&B, COVID) ARPGX2
Influenza A by PCR: NEGATIVE
Influenza B by PCR: NEGATIVE
SARS Coronavirus 2 by RT PCR: NEGATIVE

## 2021-11-28 LAB — PROTIME-INR
INR: 1.1 (ref 0.8–1.2)
Prothrombin Time: 14.6 seconds (ref 11.4–15.2)

## 2021-11-28 LAB — AMMONIA: Ammonia: 18 umol/L (ref 9–35)

## 2021-11-28 LAB — POC OCCULT BLOOD, ED: Fecal Occult Bld: POSITIVE — AB

## 2021-11-28 LAB — TROPONIN I (HIGH SENSITIVITY)
Troponin I (High Sensitivity): 53 ng/L — ABNORMAL HIGH (ref <18)
Troponin I (High Sensitivity): 49 ng/L — ABNORMAL HIGH (ref <18)

## 2021-11-28 LAB — LIPASE, BLOOD: Lipase: 89 U/L — ABNORMAL HIGH (ref 11–51)

## 2021-11-28 LAB — PREPARE RBC (CROSSMATCH)

## 2021-11-28 LAB — PHOSPHORUS: Phosphorus: 2.7 mg/dL (ref 2.5–4.6)

## 2021-11-28 LAB — MAGNESIUM: Magnesium: 1.8 mg/dL (ref 1.7–2.4)

## 2021-11-28 MED ORDER — ACETAMINOPHEN 650 MG RE SUPP: 650.0000 mg | Freq: Four times a day (QID) | RECTAL | Status: DC | PRN

## 2021-11-28 MED ORDER — CEFDINIR 300 MG PO CAPS
300.0000 mg | ORAL_CAPSULE | Freq: Two times a day (BID) | ORAL | Status: AC
  Administered 2021-11-28 – 2021-11-29 (×3): 300 mg via ORAL
  Filled 2021-11-28 (×4): qty 1

## 2021-11-28 MED ORDER — SODIUM CHLORIDE 1 G PO TABS
1.0000 g | ORAL_TABLET | Freq: Two times a day (BID) | ORAL | Status: DC
  Administered 2021-11-29 – 2021-11-30 (×2): 1 g via ORAL
  Filled 2021-11-28 (×5): qty 1

## 2021-11-28 MED ORDER — ACETAMINOPHEN 325 MG PO TABS: 650.0000 mg | ORAL_TABLET | Freq: Four times a day (QID) | ORAL | Status: DC | PRN

## 2021-11-28 MED ORDER — LORATADINE 10 MG PO TABS
10.0000 mg | ORAL_TABLET | Freq: Every day | ORAL | Status: DC
  Administered 2021-11-28 – 2021-11-30 (×3): 10 mg via ORAL
  Filled 2021-11-28 (×3): qty 1

## 2021-11-28 MED ORDER — ONDANSETRON HCL 4 MG/2ML IJ SOLN: 4.0000 mg | INTRAMUSCULAR | Status: DC | PRN

## 2021-11-28 MED ORDER — PANTOPRAZOLE SODIUM 40 MG IV SOLR
40.0000 mg | Freq: Two times a day (BID) | INTRAVENOUS | Status: DC
  Administered 2021-11-28 – 2021-11-30 (×4): 40 mg via INTRAVENOUS
  Filled 2021-11-28 (×4): qty 40

## 2021-11-28 MED ORDER — UMECLIDINIUM BROMIDE 62.5 MCG/ACT IN AEPB
1.0000 | INHALATION_SPRAY | Freq: Every day | RESPIRATORY_TRACT | Status: DC
  Filled 2021-11-28: qty 7

## 2021-11-28 MED ORDER — HYDROCODONE-ACETAMINOPHEN 5-325 MG PO TABS: 1.0000 | ORAL_TABLET | ORAL | Status: DC | PRN

## 2021-11-28 MED ORDER — IOHEXOL 350 MG/ML SOLN
60.0000 mL | Freq: Once | INTRAVENOUS | Status: AC | PRN
  Administered 2021-11-28: 13:00:00 60 mL via INTRAVENOUS

## 2021-11-28 MED ORDER — ALBUTEROL SULFATE (2.5 MG/3ML) 0.083% IN NEBU
3.0000 mL | INHALATION_SOLUTION | RESPIRATORY_TRACT | Status: DC | PRN
  Administered 2021-11-30: 23:00:00 3 mL via RESPIRATORY_TRACT
  Filled 2021-11-28: qty 3

## 2021-11-28 MED ORDER — POTASSIUM CHLORIDE 10 MEQ/100ML IV SOLN
10.0000 meq | INTRAVENOUS | Status: AC
  Administered 2021-11-28 (×4): 10 meq via INTRAVENOUS
  Filled 2021-11-28 (×4): qty 100

## 2021-11-28 MED ORDER — METOPROLOL TARTRATE 12.5 MG HALF TABLET
12.5000 mg | ORAL_TABLET | Freq: Two times a day (BID) | ORAL | Status: DC
  Administered 2021-11-28 – 2021-11-29 (×3): 12.5 mg via ORAL
  Filled 2021-11-28 (×3): qty 1

## 2021-11-28 MED ORDER — SODIUM CHLORIDE 0.9 % IV SOLN
10.0000 mL/h | Freq: Once | INTRAVENOUS | Status: AC
  Administered 2021-11-28: 14:00:00 10 mL/h via INTRAVENOUS

## 2021-11-28 MED ORDER — FLUTICASONE FUROATE-VILANTEROL 200-25 MCG/ACT IN AEPB
1.0000 | INHALATION_SPRAY | Freq: Every day | RESPIRATORY_TRACT | Status: DC
  Filled 2021-11-28: qty 28

## 2021-11-28 MED ORDER — POTASSIUM CHLORIDE CRYS ER 20 MEQ PO TBCR
20.0000 meq | EXTENDED_RELEASE_TABLET | Freq: Once | ORAL | Status: AC
  Administered 2021-11-28: 19:00:00 20 meq via ORAL
  Filled 2021-11-28: qty 1

## 2021-11-28 MED ORDER — HYDROMORPHONE HCL 1 MG/ML IJ SOLN: 1.0000 mg | INTRAMUSCULAR | Status: DC | PRN

## 2021-11-28 NOTE — Assessment & Plan Note (Signed)
Stable, no longer smoking. Declines nicotine patch Continue with inhalers.

## 2021-11-28 NOTE — Assessment & Plan Note (Signed)
On 4L [place], baseline Secondary to lung cancer Monitor in setting of large pleural effusions

## 2021-11-28 NOTE — ED Provider Notes (Addendum)
[facility] [place] EMERGENCY DEPARTMENT Provider Note   CSN: [id number] Arrival date & time: 11/12/2021  1057     History Chief Complaint  Patient presents with   Shortness of Breath   Dizziness    Tanya Jordan is a 55 y.o. female.  HPI Patient just recently discharged status post a craniotomy and neuralgic metastatic tumor resection.  Patient reports since discharge she continues to be very weak and feels that she is increasingly dizzy and weak with movements and activities.  She reports she can hardly do any kind of activities.  She also feels increasingly short of breath.  She has continued to have cough and also seeing blood-tinged mucus with cough.  No vomiting of blood.  Patient reports she has not seen a real dark or tarry stool.  She reports she makes very small amounts of stool.  She reports she has many areas of pain but no focal pain at this time that has acutely worsened.  No documented fever.    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   Tobacco abuse 01/15/2016    Past Surgical History:  Procedure Laterality Date   APPLICATION OF CRANIAL NAVIGATION N/A 11/15/2021   Procedure: APPLICATION OF CRANIAL NAVIGATION;  Surgeon: Judith Part, MD;  Location: [place];  Service: Neurosurgery;  Laterality: N/A;   BREAST BIOPSY Left    CRANIOTOMY Right 11/15/2021   Procedure: Right craniotomy for tumor resection with  brainlab;  Surgeon: Judith Part, MD;  Location: [place];  Service: Neurosurgery;  Laterality: Right;   REVISION OF SCAR ON FACE/HEAD       OB History     Gravida  4   Para      Term      Preterm      AB      Living  4      SAB      IAB      Ectopic      Multiple      Live Births  4           Family History  Problem Relation Age of Onset   Lung cancer Father    Hypertension Mother    Breast cancer Maternal Grandmother 96   Dementia Maternal Grandmother    Diabetes Maternal Grandfather    Colon cancer Neg Hx    Stomach cancer Neg Hx     Social History   Tobacco Use   Smoking status: Every Day    Packs/day: 0.25    Years: 30.00    Pack years: 7.50    Types: Cigarettes   Smokeless tobacco: Never   Tobacco comments:    3-5 cigs daily  Vaping Use   Vaping Use: Never used  Substance Use Topics   Alcohol use: Not Currently    Alcohol/week: 12.0 standard drinks    Types: 12 Cans of beer per  week   Drug use: No    Home Medications Prior to Admission medications   Medication Sig Start Date End Date Taking? Authorizing Provider  acetaminophen (TYLENOL) 325 MG tablet Take 650 mg by mouth every 6 (six) hours as needed for mild pain, fever or headache.   Yes [provider]  albuterol (VENTOLIN HFA) 108 (90 Base) MCG/ACT inhaler INHALE 2 PUFFS INTO THE LUNGS EVERY 4 (FOUR) HOURS AS NEEDED FOR WHEEZING OR SHORTNESS OF BREATH. 03/28/21 03/28/22 Yes Newlin, Charlane Ferretti, MD  budesonide-formoterol (SYMBICORT) 160-4.5 MCG/ACT inhaler Inhale 2 puffs into the lungs 2 (two) times daily. Patient taking differently: Inhale 2 puffs into the lungs 2 (two) times daily as needed (wheezing/shortness of breath). 03/28/21  Yes Charlott Rakes, MD  cefdinir (OMNICEF) 300 MG capsule Take 1 capsule (300 mg total) by mouth every 12 (twelve) hours for 7 days. 11/22/21 11/29/21 Yes Dahal, Marlowe Aschoff, MD  cetirizine (ZYRTEC) 10 MG tablet Take 1 tablet (10 mg total) by mouth  daily. 01/12/20  Yes Newlin, Enobong, MD  fluticasone furoate-vilanterol (BREO ELLIPTA) 200-25 MCG/ACT AEPB Inhale 1 puff into the lungs daily.   Yes [provider]  meclizine (ANTIVERT) 12.5 MG tablet Take 1 tablet (12.5 mg total) by mouth 3 (three) times daily as needed for up to 10 days for dizziness. 11/22/21 12/02/21 Yes Dahal, Marlowe Aschoff, MD  metoprolol tartrate (LOPRESSOR) 25 MG tablet Take 0.5 tablets (12.5 mg total) by mouth 2 (two) times daily. 11/22/21 02/20/22 Yes Dahal, Marlowe Aschoff, MD  sodium chloride 1 g tablet Take 1 tablet (1 g total) by mouth 2 (two) times daily with a meal for 7 days. 11/22/21 11/29/21 Yes Dahal, Marlowe Aschoff, MD  umeclidinium bromide (INCRUSE ELLIPTA) 62.5 MCG/ACT AEPB Inhale 1 puff into the lungs daily.   Yes [provider]    Allergies    Lisinopril  Review of Systems   Review of Systems 10 systems reviewed and negative except as per HPI Physical Exam Updated Vital Signs BP 128/70    Pulse 89    Temp 99.1 F (37.3 C) (Oral)    Resp 18    LMP 03/29/2011    SpO2 100%   Physical Exam Constitutional:      Comments: Patient is deconditioned and ill in appearance.  Status clear.  No acute appearance of respiratory distress.  HENT:     Mouth/Throat:     Pharynx: Oropharynx is clear.  Eyes:     Extraocular Movements: Extraocular movements intact.  Cardiovascular:     Rate and Rhythm: Normal rate and regular rhythm.  Pulmonary:     Comments: Intermittent wet cough.  Breath sounds very soft on the right.  Sounds decreased on bases on left.  Occasional rhonchi Abdominal:     Comments: Abdomen soft without guarding.  Mild diffuse tenderness.  Genitourinary:    Comments: Normal perianal exam.  Digital rectal for normal brown appearing stool.  No melena, no frank blood. Musculoskeletal:     Comments: Extremities are cachectic.  No significant peripheral edema.  Skin:    General: Skin is warm and dry.     Coloration: Skin is pale.  Neurological:      General: No focal deficit present.     Mental Status: She is oriented to person, place, and time.     Coordination: Coordination normal.  Psychiatric:        Mood and Affect: Mood normal.    ED Results / Procedures / Treatments   Labs (all labs ordered are listed, but  only abnormal results are displayed) Labs Reviewed  CBC WITH DIFFERENTIAL/PLATELET - Abnormal; Notable for the following components:      Result Value   WBC 12.6 (*)    RBC 2.02 (*)    Hemoglobin 5.7 (*)    HCT 17.8 (*)    Neutro Abs 10.9 (*)    Lymphs Abs 0.6 (*)    Abs Immature Granulocytes 0.09 (*)    All other components within normal limits  COMPREHENSIVE METABOLIC PANEL - Abnormal; Notable for the following components:   Sodium 131 (*)    Potassium 2.6 (*)    Chloride 92 (*)    Glucose, Bld 112 (*)    BUN <5 (*)    Calcium 8.0 (*)    Total Protein 5.5 (*)    Albumin 2.2 (*)    All other components within normal limits  POC OCCULT BLOOD, ED - Abnormal; Notable for the following components:   Fecal Occult Bld POSITIVE (*)    All other components within normal limits  TROPONIN I (HIGH SENSITIVITY) - Abnormal; Notable for the following components:   Troponin I (High Sensitivity) 53 (*)    All other components within normal limits  TROPONIN I (HIGH SENSITIVITY) - Abnormal; Notable for the following components:   Troponin I (High Sensitivity) 49 (*)    All other components within normal limits  RESP PANEL BY RT-PCR (FLU A&B, COVID) ARPGX2  CULTURE, BLOOD (ROUTINE X 2)  CULTURE, BLOOD (ROUTINE X 2)  APTT  PROTIME-INR  LACTIC ACID, PLASMA  AMMONIA  MAGNESIUM  PHOSPHORUS  PREPARE RBC (CROSSMATCH)  TYPE AND SCREEN    EKG EKG Interpretation  Date/Time:  Tuesday November 28 2021 14:27:55 EST Ventricular Rate:  97 PR Interval:  109 QRS Duration: 96 QT Interval:  358 QTC Calculation: 455 R Axis:   61 Text Interpretation: Sinus rhythm Short PR interval RSR' in V1 or V2, probably normal variant  Borderline T abnormalities, anterior leads no sig change from previous Confirmed by Charlesetta Shanks [phone]) on 11/26/2021 3:29:49 PM  Radiology DG Chest 2 View  Result Date: 11/23/2021 CLINICAL DATA:  Shortness of breath EXAM: CHEST - 2 VIEW COMPARISON:  11/20/2021 radiographs and 11/13/2021 CT chest FINDINGS: Unchanged cardiac and mediastinal contours. Nodular opacity in the right upper lobe, which correlates with the known right upper lobe mass as well as likely the associated interlobular septal thickening seen on the prior CT, which has increased since the prior exam. Increased fullness in the right hilum. Trace right pleural effusion. Small left pleural effusion.  The left lung is otherwise clear. No acute osseous abnormality IMPRESSION: 1. Redemonstrated nodular opacity in the right upper lobe, correlating with the mass seen on the prior CT, with interval increase in surrounding opacities, likely related to the previously noted interlobular septal thickening. 2. Small left and trace right pleural effusions. 3. Increased right hilar fullness, likely worsening lymphadenopathy. Electronically Signed   By: Merilyn Baba M.D.   On: 11/14/2021 11:50   CT Angio Chest/Abd/Pel for Dissection W and/or W/WO  Result Date: 11/18/2021 CLINICAL DATA:  Shortness of breath and dizziness. History of lung cancer. EXAM: CT ANGIOGRAPHY CHEST, ABDOMEN AND PELVIS TECHNIQUE: Non-contrast CT of the chest was initially obtained. Multidetector CT imaging through the chest, abdomen and pelvis was performed using the standard protocol during bolus administration of intravenous contrast. Multiplanar reconstructed images and MIPs were obtained and reviewed to evaluate the vascular anatomy. CONTRAST:  80mL OMNIPAQUE IOHEXOL 350 MG/ML SOLN COMPARISON:  CT chest  dated November 13, 2021. FINDINGS: CTA CHEST FINDINGS Cardiovascular: Preferential opacification of the thoracic aorta. No evidence of thoracic aortic aneurysm or  dissection. Coronary, aortic arch, and branch vessel atherosclerotic vascular disease. Normal heart size. Unchanged trace pericardial effusion. No pulmonary embolism. Unchanged narrowing of the right upper lobe pulmonary artery due to hilar lymphadenopathy. Mediastinum/Nodes: New conglomerate right paratracheal lymphadenopathy measuring up to 3.4 cm in short axis. Marked interval enlargement of a lower right paratracheal lymph node currently measuring 2.4 cm in short axis, previously 1.0 cm. Interval enlargement of a left hilar lymph node currently measuring 1.8 cm in short axis, previously 0.8 cm. Slight interval increase in size of an enlarged right hilar lymph node currently measuring 2.5 cm in short axis, previously 2.4 cm. New subcarinal lymphadenopathy measuring up to 1.8 cm in short axis. Multiple thyroid nodules are unchanged, including a 1.1 cm mixed cystic and solid nodule in the right thyroid lobe. Lungs/Pleura: New moderate to large bilateral pleural effusions. Dominant cavitary nodule in the right upper lobe currently measures 2.9 x 2.1 cm, previously 2.4 x 1.4 cm. Smaller satellite nodules in the right upper lobe have also mildly increased in size. Increased perilymphatic nodularity in the right upper lobe. Left greater than right lower lobe atelectasis. Mild dependent subsegmental atelectasis in the posterior left upper lobe. Unchanged 6 mm nodule in the anterior left upper lobe. No pneumothorax. Musculoskeletal: No chest wall abnormality. No acute or significant osseous findings. Significant Review of the MIP images confirms the above findings. CTA ABDOMEN AND PELVIS FINDINGS VASCULAR Aorta: Normal caliber aorta without aneurysm, dissection, vasculitis or significant stenosis. Moderate to severe atherosclerotic calcification. Celiac: Patent without evidence of aneurysm, dissection, vasculitis or significant stenosis. SMA: Patent without evidence of aneurysm, dissection, vasculitis or significant  stenosis. Renals: Two right and single left renal arteries are patent without evidence of aneurysm, dissection, vasculitis, fibromuscular dysplasia or significant stenosis. IMA: Patent without evidence of aneurysm, dissection, vasculitis or significant stenosis. Inflow: Patent without evidence of aneurysm, dissection, vasculitis or significant stenosis. Veins: No obvious venous abnormality within the limitations of this arterial phase study. Review of the MIP images confirms the above findings. NON-VASCULAR Hepatobiliary: No focal liver abnormality is seen. No gallstones, gallbladder wall thickening, or biliary dilatation. Pancreas: New 2.5 x 2.1 cm ill-defined hypodense mass in the pancreatic tail (series 6, image 146). No pancreatic ductal dilatation or surrounding inflammatory changes. Spleen: Normal in size without focal abnormality. Adrenals/Urinary Tract: Significant interval increase in size of the left adrenal mass currently measuring 4.4 x 3.4 cm, previously 1.5 x 1.5 cm. New right adrenal mass measuring 3.9 x 1.8 cm. Normal kidneys. No renal calculi or hydronephrosis. The bladder is unremarkable for the degree of distention. Stomach/Bowel: Stomach is within normal limits. Appendix appears normal. No evidence of bowel wall thickening, distention, or inflammatory changes. Lymphatic: No enlarged abdominal or pelvic lymph nodes. Reproductive: Uterus and bilateral adnexa are unremarkable. Other: No free fluid or pneumoperitoneum. Musculoskeletal: No acute or significant osseous findings. Review of the MIP images confirms the above findings. IMPRESSION: 1. No evidence of acute aortic syndrome. 2. Significant lung cancer disease progression, with worsened lymphangitic carcinomatosis, mediastinal and hilar nodal metastases, and distant metastatic disease involving both adrenal glands and the pancreas. 3. New moderate to large bilateral pleural effusions. 4. Heterogeneous 1.1 cm right thyroid nodule.  Recommend  thyroid US. 5. Aortic Atherosclerosis (ICD10-I70.0). Electronically Signed   By: Titus Dubin M.D.   On: 11/12/2021 13:51    Procedures Procedures  CRITICAL  CARE Performed by: Charlesetta Shanks   Total critical care time: 30 minutes  Critical care time was exclusive of separately billable procedures and treating other patients.  Critical care was necessary to treat or prevent imminent or life-threatening deterioration.  Critical care was time spent personally by me on the following activities: development of treatment plan with patient and/or surrogate as well as nursing, discussions with consultants, evaluation of patient's response to treatment, examination of patient, obtaining history from patient or surrogate, ordering and performing treatments and interventions, ordering and review of laboratory studies, ordering and review of radiographic studies, pulse oximetry and re-evaluation of patient's condition.  Medications Ordered in ED Medications  HYDROmorphone (DILAUDID) injection 1 mg (has no administration in time range)  ondansetron (ZOFRAN) injection 4 mg (has no administration in time range)  potassium chloride 10 mEq in 100 mL IVPB (10 mEq Intravenous New Bag/Given 11/20/2021 1528)  0.9 %  sodium chloride infusion (10 mL/hr Intravenous New Bag/Given 11/26/2021 1332)  iohexol (OMNIPAQUE) 350 MG/ML injection 60 mL (60 mLs Intravenous Contrast Given 11/02/2021 1321)    ED Course  I have reviewed the triage vital signs and the nursing notes.  Pertinent labs & imaging results that were available during my care of the patient were reviewed by me and considered in my medical decision making (see chart for details).    MDM Rules/Calculators/A&P                         Patient presents with severe generalized weakness.  She has metastatic lung cancer with brain metastases.  She had this resected recently.  Since discharge patient has had increasing general weakness.  Labs show significant  anemia.  With hemoglobin down to 5.7 from 8 at discharge.  Patient is also significantly hypokalemic.  Blood ordered and potassium ordered.  Patient's blood pressures remained stable.  She is not symptomatically hypotensive or tachycardic.  Mental status is clear.  Plan to continue with symptomatic treatment of acute complications with severe chronic underlying disease.  Patient's rectal exam does not show melena or blood, does test occult positive.  No apparent site of active bleeding at this time.  Patient denies vomiting blood.  She does report extreme loss of appetite and oral intake.  At this time plan for admission for ongoing treatment of multiple medical complications from metastatic lung cancer.  Consult: Internal medicine teaching service for admission.   Final Clinical Impression(s) / ED Diagnoses Final diagnoses:  Primary malignant neoplasm of lung metastatic to other site, unspecified laterality ([place])  Symptomatic anemia  Hypokalemia  Generalized weakness    Rx / DC Orders ED Discharge Orders     None        Charlesetta Shanks, MD 11/06/2021 [place], MD 11/26/2021 1619

## 2021-11-28 NOTE — Assessment & Plan Note (Signed)
Intermittent, no urgency or frequency, no blood Check UA

## 2021-11-28 NOTE — Assessment & Plan Note (Signed)
No longer smoking, declines nicotine patch

## 2021-11-28 NOTE — Assessment & Plan Note (Signed)
Incidental finding on CT Recommend outpatient thyroid US

## 2021-11-28 NOTE — Consult Note (Signed)
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 Repeat imaging in ED reveals rapid progression of metastatic disease, with worse lymphangitic carcinomatosis, mediastinal and hilar mets, bilateral adrenal mets, pancreatic met and new large bilateral pleural effusion.   Has had hemoptysis for months, slightly worse the past few weeks. Progressive SOB since discharging from hospital after surgery. Additional weight loss -- reports " significant" weight loss over the past few months but unable to quantify.   Was scheduled for initial oncology appointment in early January.    Pulmonology consulted in this setting   Pertinent  Medical History  Stage IV adenocarcinoma of lung  Tobacco use disorder COPD HTN  Significant Hospital Events: Including procedures, antibiotic start and stop dates in addition to other pertinent events   12/27 Admit to [facility] after presenting with SOB, malaise. Significant progression of metastatic disease since 11/13/21. Pulm consulted   Interim History / Subjective:   Reviewed progression of metastatic disease with patient and daughter  Objective   Blood pressure (!) 151/83, pulse 92, temperature (!) 97.3 F (36.3 C), temperature source Oral, resp. rate 12, last menstrual period 03/29/2011, SpO2 100 %.       No intake or output data in the 24 hours ending 11/05/2021 1803 There were no vitals filed for this visit.  Examination: General: Ill appearing middle aged F reclined in bed NAD HENT: NCAT anicteric sclera Lungs: Even and unlabored. Diminished. Shallow   Cardiovascular: rr cap refill < 3 sec  Abdomen: soft ndnt  Extremities: no acute joint deformity, no edema Neuro: hard of hearing. Awake alert following commands  GU: defer   Resolved Hospital Problem list     Assessment & Plan:   Metastatic adenocarcinoma of lung with mediastinal & hilar mets, mets to brain, bilateral adrenals, pancreas Bilateral pleural effusions  Hemoptysis  -rapidly progressing metastatic disease compared to imaging 2 weeks ago  -recommend inpatient oncology consultation (was set to see outpt early January, and for completeness think it would be best for onc to consult inpt) -agree with palliative care consult -at this time, no txa nebs. Monitor for worsening hemoptysis  -no thora/chest tube for effusions right now  -encourage enteral intake  -Supplemental O2 as needed    Best Practice (right click and "Reselect all SmartList Selections" daily)   Diet/type: Regular consistency (see orders) Code Status:  DNR Last date of multidisciplinary goals of care discussion [per primary ]  Labs   CBC: Recent Labs  Lab 11/22/21 0318 11/10/2021 1118  WBC 10.6* 12.6*  NEUTROABS 8.5* 10.9*  HGB 8.1* 5.7*  HCT 23.8* 17.8*  MCV 85.9 88.1  PLT 227 741    Basic Metabolic Panel: Recent Labs  Lab 11/22/21 0318 11/27/2021 1118 11/22/2021 1245  NA 128* 131*  --   K 3.3* 2.6*  --   CL 95* 92*  --   CO2 24 28  --   GLUCOSE 98 112*  --   BUN 8 <5*  --   CREATININE 0.55 0.50  --   CALCIUM 7.8* 8.0*  --   MG  --   --  1.8  PHOS  --   --  2.7   GFR: CrCl cannot be calculated (Unknown ideal weight.). Recent Labs  Lab 11/22/21 0318 11/03/2021 1118 11/15/2021 1245  PROCALCITON 13.05  --   --   WBC 10.6* 12.6*  --   LATICACIDVEN  --   --  1.1    Liver Function Tests: Recent Labs  Lab 11/24/2021 1118  AST 18  ALT 16  ALKPHOS 77  BILITOT 0.7  PROT 5.5*  ALBUMIN 2.2*   No results for input(s): LIPASE, AMYLASE in the last 168 hours. Recent Labs  Lab  11/13/2021 1245  AMMONIA 18    ABG    Component Value Date/Time   PHART 7.435 11/15/2021 0902   PCO2ART 43.4 11/15/2021 0902   PO2ART 358 (H) 11/15/2021 0902   HCO3 29.1 (H) 11/15/2021 0902   TCO2 30 11/15/2021 0902   O2SAT 100.0 11/15/2021 0902     Coagulation Profile: Recent Labs  Lab 11/25/2021 1118  INR 1.1    Cardiac Enzymes: No results for input(s): CKTOTAL, CKMB, CKMBINDEX, TROPONINI in the last 168 hours.  HbA1C: Hemoglobin A1C  Date/Time Value Ref Range Status  06/17/2017 02:11 PM 5.4  Final    CBG: No results for input(s): GLUCAP in the last 168 hours.  Review of Systems:   Review of Systems  Constitutional:  Positive for malaise/fatigue and weight loss.  HENT: Negative.    Eyes: Negative.   Respiratory:  Positive for cough, hemoptysis and shortness of breath.   Cardiovascular:  Positive for palpitations. Negative for leg swelling and PND.  Gastrointestinal: Negative.   Genitourinary: Negative.   Musculoskeletal: Negative.   Skin: Negative.   Neurological:  Positive for headaches.  Endo/Heme/Allergies: Negative.   Psychiatric/Behavioral: Negative.     Past Medical History:  She,  has a past medical history of Allergy, COPD (chronic obstructive pulmonary disease) ([place]), Family history of breast cancer, Hypertension, and Personal history of colonic polyps.   Surgical History:   Past Surgical History:  Procedure Laterality Date   APPLICATION OF CRANIAL NAVIGATION N/A 11/15/2021   Procedure: APPLICATION OF CRANIAL NAVIGATION;  Surgeon: Judith Part, MD;  Location: [facility];  Service: Neurosurgery;  Laterality: N/A;   BREAST BIOPSY Left    CRANIOTOMY Right 11/15/2021   Procedure: Right craniotomy for tumor resection with brainlab;  Surgeon: Judith Part, MD;  Location: [facility];  Service: Neurosurgery;  Laterality: Right;   REVISION OF SCAR ON FACE/HEAD       Social History:   reports that she has been smoking cigarettes. She has a 7.50  pack-year smoking history. She has never used smokeless tobacco. She reports that she does not currently use alcohol after a past usage of about 12.0 standard drinks per week. She reports that she does not use drugs.   Family History:  Her family history includes Breast cancer (age of onset: 77) in her maternal grandmother; Dementia in her maternal grandmother; Diabetes in her maternal grandfather; Hypertension in her mother; Lung cancer in her father. There is no history of Colon cancer or Stomach cancer.   Allergies Allergies  Allergen Reactions   Lisinopril Swelling     Home Medications  Prior to Admission medications   Medication Sig Start Date End Date Taking? Authorizing Provider  acetaminophen (TYLENOL) 325 MG tablet Take 650 mg by mouth every 6 (six) hours as needed for mild pain, fever or headache.   Yes [provider]  albuterol (VENTOLIN HFA) 108 (90 Base) MCG/ACT  inhaler INHALE 2 PUFFS INTO THE LUNGS EVERY 4 (FOUR) HOURS AS NEEDED FOR WHEEZING OR SHORTNESS OF BREATH. 03/28/21 03/28/22 Yes Newlin, Charlane Ferretti, MD  budesonide-formoterol (SYMBICORT) 160-4.5 MCG/ACT inhaler Inhale 2 puffs into the lungs 2 (two) times daily. Patient taking differently: Inhale 2 puffs into the lungs 2 (two) times daily as needed (wheezing/shortness of breath). 03/28/21  Yes Charlott Rakes, MD  cefdinir (OMNICEF) 300 MG capsule Take 1 capsule (300 mg total) by mouth every 12 (twelve) hours for 7 days. 11/22/21 11/29/21 Yes Dahal, Marlowe Aschoff, MD  cetirizine (ZYRTEC) 10 MG tablet Take 1 tablet (10 mg total) by mouth daily. 01/12/20  Yes Newlin, Enobong, MD  fluticasone furoate-vilanterol (BREO ELLIPTA) 200-25 MCG/ACT AEPB Inhale 1 puff into the lungs daily.   Yes [provider]  meclizine (ANTIVERT) 12.5 MG tablet Take 1 tablet (12.5 mg total) by mouth 3 (three) times daily as needed for up to 10 days for dizziness. 11/22/21 12/02/21 Yes Dahal, Marlowe Aschoff, MD  metoprolol tartrate (LOPRESSOR) 25 MG tablet  Take 0.5 tablets (12.5 mg total) by mouth 2 (two) times daily. 11/22/21 02/20/22 Yes Dahal, Marlowe Aschoff, MD  sodium chloride 1 g tablet Take 1 tablet (1 g total) by mouth 2 (two) times daily with a meal for 7 days. 11/22/21 11/29/21 Yes Dahal, Marlowe Aschoff, MD  umeclidinium bromide (INCRUSE ELLIPTA) 62.5 MCG/ACT AEPB Inhale 1 puff into the lungs daily.   Yes [provider]     Critical care time: n/a     Eliseo Gum MSN, AGACNP-BC [place] for pager  11/23/2021, 6:40 PM

## 2021-11-28 NOTE — ED Triage Notes (Signed)
Pt reports shob and dizziness "every time she takes her medication." Also reports cough and sore throat. Also reports headache. Recent hx of brain CA, ICH, and craniotomy.

## 2021-11-28 NOTE — Assessment & Plan Note (Addendum)
55 year old presenting with dizziness and fatigue found to have hgb of 5.7 and +fecal occult Denies any blood in stool has been coughing up blood every time she cough with her mucous since Saturday. Likely from malignancy. Was having intermittent hemoptysis on discharge last week.  Place in progressive  She is currently receiving 3 units of PRBC in ED. Will check H&H post transfusion and then trend q 6 hours. Transfuse to keep hgb >7.0 Start protonix 40mg  q12 hours and place on clear liquid diet Consult GI in AM  Orthostatics

## 2021-11-28 NOTE — Assessment & Plan Note (Signed)
Had new onset atrial fibrillation last week during hospitalization with fever and post obstructive pneumonia.  norvasc changed to metoprolol No complaints of palpitations. TSH wnl.  NSR here today Likely secondary to fever/lung disease On telemetry, no AC in setting of severe anemia

## 2021-11-28 NOTE — Assessment & Plan Note (Signed)
Likely demand ischemia in setting of significant anemia No ST changes on ekg and no chest pain Continue telemetry

## 2021-11-28 NOTE — H&P (Addendum)
History and Physical    Tanya Jordan FAO:[id number] DOB: Oct 11, 1966 DOA: 11/20/2021  PCP: Charlott Rakes, MD Consultants:  oncology: Dr.  Lisbeth Renshaw (has not seen yet) neurosurgery: Dr. Zada Finders Patient coming from:  Home - lives with daughter   Chief Complaint: not feeling well, fatigue, coughing up blood.   HPI: Tanya Jordan is a 55 y.o. female with medical history significant of COPD, alcohol abuse, allergies, HTN, metastatic lung cancer with chronic respiratory failure on 2-4L oxygen who presents to the hospital for "not feeling well." She thought she was having a stoke this AM because she felt like her right arm was weak. She felt like she couldn't move it well. Denies any tingling or numbness. She has also been more fatigued and sleeping more. Her daughter also thought she looked more yellow.   She has had no fever/chills, chest pain or palpitations, some dizziness with positional changes, +sob/cough (at [place]) no stomach pain, no diarrhea, +nausea and some post tussive spitting up, intermittent dysuria no urgency or frequency, no leg swelling.   Denies any blood in stool or urine. She is coughing up blood about every time she is coughing. Has gotten worse since Saturday.   Colonoscopy about 4-5 years ago. Do not have records. They do not recall any abnormal findings. Never had EGD. History of alcohol abuse in the past, occasional beer now.    ED Course: vitals: afebrile, bp: 96/86, HR: 107, RR: 18, oxygen: 96% 4L [place],  Pertinent labs: wbc: 12.6, hgb: 5.7/hct: 17.8, potassium: 2.6, sodium: 131, albumin: 2.2, troponin 53>49, magnesium: 1.8, fecal occult: positive  CXR: re-demonstrated nodular opacity in the right upper lobe, correlating with the mass seen on prior on the CT with interval increase in surrounding opacities. Small left and trace pleural effusion, increased right hilar fullness, likely worsening lymphadenopathy.  CTA chest/abdo/pelvis: moderate to large bilateral  pleural effusions. Dominant cavitary nodule in RUL currently measures 2.9x2.1cm previously 2.4x1.4cm. smaller satellite nodules in RUL have also mildly increased in size.  New 2.5x2.1cm ill defined hypodense mass in pancreatic tail.  Increase in left adrenal mass and new right adrenal mass.  1.1cm thyroid nodule: recommend thyroid US  In ED: given 54meq potassium IV, blood cultures obtained, and 3 units of PRBC ordered.   Review of Systems: As per HPI; otherwise review of systems reviewed and negative.   Ambulatory Status:  Ambulates with walker at times.    Past Medical History:  Diagnosis Date   Allergy    COPD (chronic obstructive pulmonary disease) ([place])    Family history of breast cancer    Hypertension    Personal history of colonic polyps     Past Surgical History:  Procedure Laterality Date   APPLICATION OF CRANIAL NAVIGATION N/A 11/15/2021   Procedure: APPLICATION OF CRANIAL NAVIGATION;  Surgeon: Judith Part, MD;  Location: [facility];  Service: Neurosurgery;  Laterality: N/A;   BREAST BIOPSY Left    CRANIOTOMY Right 11/15/2021   Procedure: Right craniotomy for tumor resection with brainlab;  Surgeon: Judith Part, MD;  Location: [facility];  Service: Neurosurgery;  Laterality: Right;   REVISION OF SCAR ON FACE/HEAD      Social History   Socioeconomic History   Marital status: Divorced    Spouse name: Not on file   Number of children: Not on file   Years of education: Not on file   Highest education level: Not on file  Occupational History   Occupation: Scientist, water quality  Tobacco Use  Smoking status: Every Day    Packs/day: 0.25    Years: 30.00    Pack years: 7.50    Types: Cigarettes   Smokeless tobacco: Never   Tobacco comments:    3-5 cigs daily  Vaping Use   Vaping Use: Never used  Substance and Sexual Activity   Alcohol use: Not Currently    Alcohol/week: 12.0 standard drinks    Types: 12 Cans of beer per week   Drug use: No   Sexual activity: Never   Other Topics Concern   Not on file  Social History Narrative   Not on file   Social Determinants of Health   Financial Resource Strain: Not on file  Food Insecurity: Not on file  Transportation Needs: Not on file  Physical Activity: Not on file  Stress: Not on file  Social Connections: Not on file  Intimate Partner Violence: Not on file    Allergies  Allergen Reactions   Lisinopril Swelling    Family History  Problem Relation Age of Onset   Lung cancer Father    Hypertension Mother    Breast cancer Maternal Grandmother 96   Dementia Maternal Grandmother    Diabetes Maternal Grandfather    Colon cancer Neg Hx    Stomach cancer Neg Hx     Prior to Admission medications   Medication Sig Start Date End Date Taking? Authorizing Provider  acetaminophen (TYLENOL) 325 MG tablet Take 650 mg by mouth every 6 (six) hours as needed for mild pain, fever or headache.   Yes [provider]  albuterol (VENTOLIN HFA) 108 (90 Base) MCG/ACT inhaler INHALE 2 PUFFS INTO THE LUNGS EVERY 4 (FOUR) HOURS AS NEEDED FOR WHEEZING OR SHORTNESS OF BREATH. 03/28/21 03/28/22 Yes Newlin, Charlane Ferretti, MD  budesonide-formoterol (SYMBICORT) 160-4.5 MCG/ACT inhaler Inhale 2 puffs into the lungs 2 (two) times daily. Patient taking differently: Inhale 2 puffs into the lungs 2 (two) times daily as needed (wheezing/shortness of breath). 03/28/21  Yes Charlott Rakes, MD  cefdinir (OMNICEF) 300 MG capsule Take 1 capsule (300 mg total) by mouth every 12 (twelve) hours for 7 days. 11/22/21 11/29/21 Yes Dahal, Marlowe Aschoff, MD  cetirizine (ZYRTEC) 10 MG tablet Take 1 tablet (10 mg total) by mouth daily. 01/12/20  Yes Newlin, Enobong, MD  fluticasone furoate-vilanterol (BREO ELLIPTA) 200-25 MCG/ACT AEPB Inhale 1 puff into the lungs daily.   Yes [provider]  meclizine (ANTIVERT) 12.5 MG tablet Take 1 tablet (12.5 mg total) by mouth 3 (three) times daily as needed for up to 10 days for dizziness. 11/22/21 12/02/21  Yes Dahal, Marlowe Aschoff, MD  metoprolol tartrate (LOPRESSOR) 25 MG tablet Take 0.5 tablets (12.5 mg total) by mouth 2 (two) times daily. 11/22/21 02/20/22 Yes Dahal, Marlowe Aschoff, MD  sodium chloride 1 g tablet Take 1 tablet (1 g total) by mouth 2 (two) times daily with a meal for 7 days. 11/22/21 11/29/21 Yes Dahal, Marlowe Aschoff, MD  umeclidinium bromide (INCRUSE ELLIPTA) 62.5 MCG/ACT AEPB Inhale 1 puff into the lungs daily.   Yes [provider]    Physical Exam: Vitals:   11/27/2021 1730 11/08/2021 1800 11/17/2021 1815 11/03/2021 1847  BP: (!) 151/83 (!) 141/108 (!) 146/79 138/78  Pulse: 92 (!) 111 95 93  Resp: 12 (!) 26 (!) 28 (!) 21  Temp: (!) 97.3 F (36.3 C)  99.6 F (37.6 C) 98.4 F (36.9 C)  TempSrc: Oral  Oral Oral  SpO2: 100% 98% 99%      General:  Appears calm and comfortable  and is in NAD. Pale appearing  Eyes:  PERRL, EOMI, normal lids, iris ENT:  hard of hearing, lips & tongue, mmm; no teeth  Neck:  no LAD, masses or thyromegaly; no carotid bruits. Left side of neck inflammed/enlarged compared to right side.  Cardiovascular:  RRR, no m/r/g. No LE edema.  Respiratory:   decreased breath sounds. with no wheezes/rales/rhonchi.  Normal respiratory effort. Abdomen:  mild epigastric TTP, , ND, NABS Back:   normal alignment, no CVAT Skin:  no rash or induration seen on limited exam Musculoskeletal:  grossly normal tone. LUE: 5/5, RUE: 4-5/5/ bilateral LE: 5/5.  good ROM, no bony abnormality Lower extremity:  No LE edema.  Limited foot exam with no ulcerations.  2+ distal pulses. Psychiatric:  grossly normal mood and affect, speech fluent and appropriate, AOx3 Neurologic:  CN 2-12 grossly intact, moves all extremities in coordinated fashion, sensation intact    Radiological Exams on Admission: Independently reviewed - see discussion in A/P where applicable  DG Chest 2 View  Result Date: 11/14/2021 CLINICAL DATA:  Shortness of breath EXAM: CHEST - 2 VIEW COMPARISON:  11/20/2021  radiographs and 11/13/2021 CT chest FINDINGS: Unchanged cardiac and mediastinal contours. Nodular opacity in the right upper lobe, which correlates with the known right upper lobe mass as well as likely the associated interlobular septal thickening seen on the prior CT, which has increased since the prior exam. Increased fullness in the right hilum. Trace right pleural effusion. Small left pleural effusion.  The left lung is otherwise clear. No acute osseous abnormality IMPRESSION: 1. Redemonstrated nodular opacity in the right upper lobe, correlating with the mass seen on the prior CT, with interval increase in surrounding opacities, likely related to the previously noted interlobular septal thickening. 2. Small left and trace right pleural effusions. 3. Increased right hilar fullness, likely worsening lymphadenopathy. Electronically Signed   By: Merilyn Baba M.D.   On: 11/10/2021 11:50   CT Angio Chest/Abd/Pel for Dissection W and/or W/WO  Result Date: 12/02/2021 CLINICAL DATA:  Shortness of breath and dizziness. History of lung cancer. EXAM: CT ANGIOGRAPHY CHEST, ABDOMEN AND PELVIS TECHNIQUE: Non-contrast CT of the chest was initially obtained. Multidetector CT imaging through the chest, abdomen and pelvis was performed using the standard protocol during bolus administration of intravenous contrast. Multiplanar reconstructed images and MIPs were obtained and reviewed to evaluate the vascular anatomy. CONTRAST:  74mL OMNIPAQUE IOHEXOL 350 MG/ML SOLN COMPARISON:  CT chest dated November 13, 2021. FINDINGS: CTA CHEST FINDINGS Cardiovascular: Preferential opacification of the thoracic aorta. No evidence of thoracic aortic aneurysm or dissection. Coronary, aortic arch, and branch vessel atherosclerotic vascular disease. Normal heart size. Unchanged trace pericardial effusion. No pulmonary embolism. Unchanged narrowing of the right upper lobe pulmonary artery due to hilar lymphadenopathy. Mediastinum/Nodes: New  conglomerate right paratracheal lymphadenopathy measuring up to 3.4 cm in short axis. Marked interval enlargement of a lower right paratracheal lymph node currently measuring 2.4 cm in short axis, previously 1.0 cm. Interval enlargement of a left hilar lymph node currently measuring 1.8 cm in short axis, previously 0.8 cm. Slight interval increase in size of an enlarged right hilar lymph node currently measuring 2.5 cm in short axis, previously 2.4 cm. New subcarinal lymphadenopathy measuring up to 1.8 cm in short axis. Multiple thyroid nodules are unchanged, including a 1.1 cm mixed cystic and solid nodule in the right thyroid lobe. Lungs/Pleura: New moderate to large bilateral pleural effusions. Dominant cavitary nodule in the right upper lobe currently measures 2.9 x  2.1 cm, previously 2.4 x 1.4 cm. Smaller satellite nodules in the right upper lobe have also mildly increased in size. Increased perilymphatic nodularity in the right upper lobe. Left greater than right lower lobe atelectasis. Mild dependent subsegmental atelectasis in the posterior left upper lobe. Unchanged 6 mm nodule in the anterior left upper lobe. No pneumothorax. Musculoskeletal: No chest wall abnormality. No acute or significant osseous findings. Significant Review of the MIP images confirms the above findings. CTA ABDOMEN AND PELVIS FINDINGS VASCULAR Aorta: Normal caliber aorta without aneurysm, dissection, vasculitis or significant stenosis. Moderate to severe atherosclerotic calcification. Celiac: Patent without evidence of aneurysm, dissection, vasculitis or significant stenosis. SMA: Patent without evidence of aneurysm, dissection, vasculitis or significant stenosis. Renals: Two right and single left renal arteries are patent without evidence of aneurysm, dissection, vasculitis, fibromuscular dysplasia or significant stenosis. IMA: Patent without evidence of aneurysm, dissection, vasculitis or significant stenosis. Inflow: Patent without  evidence of aneurysm, dissection, vasculitis or significant stenosis. Veins: No obvious venous abnormality within the limitations of this arterial phase study. Review of the MIP images confirms the above findings. NON-VASCULAR Hepatobiliary: No focal liver abnormality is seen. No gallstones, gallbladder wall thickening, or biliary dilatation. Pancreas: New 2.5 x 2.1 cm ill-defined hypodense mass in the pancreatic tail (series 6, image 146). No pancreatic ductal dilatation or surrounding inflammatory changes. Spleen: Normal in size without focal abnormality. Adrenals/Urinary Tract: Significant interval increase in size of the left adrenal mass currently measuring 4.4 x 3.4 cm, previously 1.5 x 1.5 cm. New right adrenal mass measuring 3.9 x 1.8 cm. Normal kidneys. No renal calculi or hydronephrosis. The bladder is unremarkable for the degree of distention. Stomach/Bowel: Stomach is within normal limits. Appendix appears normal. No evidence of bowel wall thickening, distention, or inflammatory changes. Lymphatic: No enlarged abdominal or pelvic lymph nodes. Reproductive: Uterus and bilateral adnexa are unremarkable. Other: No free fluid or pneumoperitoneum. Musculoskeletal: No acute or significant osseous findings. Review of the MIP images confirms the above findings. IMPRESSION: 1. No evidence of acute aortic syndrome. 2. Significant lung cancer disease progression, with worsened lymphangitic carcinomatosis, mediastinal and hilar nodal metastases, and distant metastatic disease involving both adrenal glands and the pancreas. 3. New moderate to large bilateral pleural effusions. 4. Heterogeneous 1.1 cm right thyroid nodule.  Recommend thyroid US. 5. Aortic Atherosclerosis (ICD10-I70.0). Electronically Signed   By: Titus Dubin M.D.   On: 11/24/2021 13:51    EKG: Independently reviewed.  NSR with rate 89; nonspecific ST changes with no evidence of acute ischemia   Labs on Admission: I have personally reviewed  the available labs and imaging studies at the time of the admission.  Pertinent labs:  wbc: 12.6,  hgb: 5.7/hct: 17.8,  potassium: 2.6,  sodium: 131,  albumin: 2.2,  troponin 53>49,  magnesium: 1.8,  fecal occult: positive   Assessment/Plan Symptomatic anemia 55 year old presenting with dizziness and fatigue found to have hgb of 5.7 and +fecal occult Denies any blood in stool has been coughing up blood every time she cough with her mucous since Saturday. Likely from malignancy. Was having intermittent hemoptysis on discharge last week.  Place in progressive  She is currently receiving 3 units of PRBC in ED. Will check H&H post transfusion and then trend q 6 hours. Transfuse to keep hgb>7.0 Start protonix 40mg  q12 hours and place on clear liquid diet Consult GI in AM in indicated with GOC.  Orthostatics   Metastatic primary lung cancer ([place]) Cancer has progressed with moderate to large  effusions She is requiring no new oxygen demand and on her chronic 4L Starting to cough up blood Discuss with her that with such significant progression and with new lesions to pancreatic tail and left adrenal gland make prognosis poor Called PCCM for consult and they will come and see patient, appreciate their recommendations for effusions/lung cancer Consulted oncology, Dr. Alen Blew, will see tomorrow.  Consulted palliative care  Denies any pain, norco prn for pain.   Intracranial hemorrhage (HCC) S/p right sided craniotomy and tumor resection on 11/15/21 Appears stable, but with complaints of right sided arm weakness, will check head CT Discussed with neurosurgery on call and no need to consult unless abnormal finding on CT.  CT with markedly progressed disease with multiple new lesions. Discussed again with neurosurgery, will review imaging and f/u if needed.   Hypokalemia Potassium down to 2.6, magnesium wnl  Hasn't had any extra renal losses.  Continue telemetry. Received 34meq via IV  replacement.  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   Elevated troponin Likely demand ischemia in setting of significant anemia No ST changes on ekg and no chest pain Continue telemetry    Chronic respiratory failure (HCC) On 4L , baseline Secondary to lung cancer Monitor in setting of large pleural effusions   Dysuria Intermittent, no urgency or frequency, no blood Check UA  COPD with chronic bronchitis (HCC) Stable, no longer smoking. Declines nicotine patch Continue with inhalers.   AF (paroxysmal atrial fibrillation) ([place]) Had new onset atrial fibrillation last week during hospitalization with fever and post obstructive pneumonia.  norvasc changed to metoprolol No complaints of palpitations. TSH wnl.  NSR here today Likely secondary to fever/lung disease On telemetry, no AC in setting of severe anemia   History of pneumonia Diagnosed last week during hospitalization  Given cefdinir at discharge, completes course tomorrow.   Tobacco abuse No longer smoking, declines nicotine patch   Thyroid nodule Incidental finding on CT Recommend outpatient thyroid US     There is no height or weight on file to calculate BMI.    Level of care: Progressive DVT prophylaxis:  SCDs Code Status: DNR- confirmed with patient Family Communication: daughter at bedside: Rosendo Gros  Disposition Plan:  The patient is from: home  Anticipated d/c is to: home  Requires inpatient hospitalization and is at significant risk of worsening due to significant anemia, hypokalemia and progressive cancer and requires blood transfusion, IV medication and  constant monitoring, assessment and MDM with specialists.     Patient is currently: ill, poor prognosis  Consults called: neurosurgery curbside, PCCM,  oncology and palliative care  Admission status:  inpatient    Orma Flaming MD [facility]   How to contact the [facility] Attending or Consulting provider [place] or covering provider during after hours [place], for this patient?  Check the care team in [facility] and look for a) attending/consulting [facility] provider listed and b) the [facility] team listed Log into [email or web address] and use [place]'s universal password to access. If you do not have the password, please contact the hospital operator. Locate the [facility] provider you are looking for under [facility] and page to a number that you can be directly reached. If you still have difficulty reaching the provider, please page the [facility] (Director on Call) for the Hospitalists listed on  amion for assistance.   11/12/2021, 7:05 PM

## 2021-11-28 NOTE — ED Notes (Signed)
Unable to obtain 2200 BMP and 2354 CBC due to blood transfusing.

## 2021-11-28 NOTE — Assessment & Plan Note (Signed)
Diagnosed last week during hospitalization  Given cefdinir at discharge, completes course tomorrow.

## 2021-11-28 NOTE — Assessment & Plan Note (Signed)
S/p right sided craniotomy and tumor resection on 11/15/21 Appears stable, but with complaints of right sided arm weakness, will check head CT Discussed with neurosurgery on call and no need to consult unless abnormal finding on CT.

## 2021-11-28 NOTE — Assessment & Plan Note (Signed)
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

## 2021-11-28 NOTE — ED Provider Notes (Signed)
Emergency Medicine Provider Triage Evaluation Note  Tanya Jordan , a 55 y.o. female  was evaluated in triage.  Pt complains of SOB for "I don't know how long". The patient reports she thought she was having a stroke because of the SOB. Recent admission for ICH. Chart investigation shows new onset afib, lung cancer, brain cancer. Denies any chest pain. Newly put on "I think 4L" of oxygen at home.  Review of Systems  Positive: Sob, lightheadedness Negative: Chest pain, fevers  Physical Exam  BP 94/83 (BP Location: Right Arm)    Pulse 89    Temp 99.4 F (37.4 C) (Oral)    Resp 18    LMP 03/29/2011    SpO2 100%  Gen:   Awake, no distress   Resp:  Normal effort  MSK:   Moves extremities without difficulty  Other:  Diffuse ecchymosis to her anterior chest wall and left breast visualized during EKG. Mildly tender to palpation  Medical Decision Making  Medically screening exam initiated at 11:27 AM.  Appropriate orders placed.  Tanya Jordan was informed that the remainder of the evaluation will be completed by another provider, this initial triage assessment does not replace that evaluation, and the importance of remaining in the ED until their evaluation is complete.  the patient does not know what happened, but denies any trauma. She reports it may have happened when she was in ICU, but she is unsure. Overall poor historian. Given recent medical admission and patient presentation, chest pain order set placed. Will defer head CT. Nursing is aware that the patient needs to be roomed now.   Sherrell Puller, PA-C 11/10/2021 1135    Truddie Hidden, MD 11/26/2021 [phone]

## 2021-11-28 NOTE — Assessment & Plan Note (Addendum)
Cancer has progressed with moderate to large effusions She is requiring no new oxygen demand and on her chronic 4L Starting to cough up blood Discuss with her that with such significant progression and with new lesions to pancreatic tail and left adrenal gland make prognosis poor Called PCCM for consult and they will come and see patient, appreciate their recommendations for effusions/lung cancer Consulted oncology, Dr. Alen Blew, will see tomorrow.  Consulted palliative care  Denies any pain, norco prn for pain.

## 2021-11-28 NOTE — Assessment & Plan Note (Signed)
Continue home lopressor, had one low reading on arrival, but BP trending upward.

## 2021-11-28 NOTE — Assessment & Plan Note (Addendum)
Potassium down to 2.6, magnesium wnl  Hasn't had any extra renal losses.  Continue telemetry. Received 24meq via IV replacement.  Will give 55meq more PO Repeat bmp tonight and in AM

## 2021-11-29 ENCOUNTER — Encounter: Payer: Self-pay | Admitting: *Deleted

## 2021-11-29 ENCOUNTER — Ambulatory Visit: Payer: 59 | Admitting: Family Medicine

## 2021-11-29 DIAGNOSIS — I480 Paroxysmal atrial fibrillation: Secondary | ICD-10-CM | POA: Diagnosis not present

## 2021-11-29 DIAGNOSIS — C3490 Malignant neoplasm of unspecified part of unspecified bronchus or lung: Secondary | ICD-10-CM

## 2021-11-29 DIAGNOSIS — J9610 Chronic respiratory failure, unspecified whether with hypoxia or hypercapnia: Secondary | ICD-10-CM | POA: Diagnosis not present

## 2021-11-29 DIAGNOSIS — Z515 Encounter for palliative care: Secondary | ICD-10-CM

## 2021-11-29 DIAGNOSIS — Z7189 Other specified counseling: Secondary | ICD-10-CM

## 2021-11-29 DIAGNOSIS — D649 Anemia, unspecified: Secondary | ICD-10-CM | POA: Diagnosis not present

## 2021-11-29 DIAGNOSIS — Z789 Other specified health status: Secondary | ICD-10-CM

## 2021-11-29 DIAGNOSIS — J449 Chronic obstructive pulmonary disease, unspecified: Secondary | ICD-10-CM | POA: Diagnosis not present

## 2021-11-29 DIAGNOSIS — Z66 Do not resuscitate: Secondary | ICD-10-CM

## 2021-11-29 DIAGNOSIS — R531 Weakness: Secondary | ICD-10-CM

## 2021-11-29 DIAGNOSIS — E041 Nontoxic single thyroid nodule: Secondary | ICD-10-CM

## 2021-11-29 LAB — TYPE AND SCREEN
ABO/RH(D): O POS
Antibody Screen: NEGATIVE
Unit division: 0
Unit division: 0
Unit division: 0

## 2021-11-29 LAB — CBC
HCT: 33.8 % — ABNORMAL LOW (ref 36.0–46.0)
Hemoglobin: 11.3 g/dL — ABNORMAL LOW (ref 12.0–15.0)
MCH: 28.7 pg (ref 26.0–34.0)
MCHC: 33.4 g/dL (ref 30.0–36.0)
MCV: 85.8 fL (ref 80.0–100.0)
Platelets: 186 10*3/uL (ref 150–400)
RBC: 3.94 MIL/uL (ref 3.87–5.11)
RDW: 14.6 % (ref 11.5–15.5)
WBC: 18.1 10*3/uL — ABNORMAL HIGH (ref 4.0–10.5)
nRBC: 0.2 % (ref 0.0–0.2)

## 2021-11-29 LAB — BPAM RBC
Blood Product Expiration Date: 202301012359
Blood Product Expiration Date: 202301022359
Blood Product Expiration Date: 202301022359
ISSUE DATE / TIME: 202212271458
ISSUE DATE / TIME: 202212271759
ISSUE DATE / TIME: 202212272240
Unit Type and Rh: 9500
Unit Type and Rh: 9500
Unit Type and Rh: 9500

## 2021-11-29 LAB — BASIC METABOLIC PANEL
Anion gap: 15 (ref 5–15)
BUN: 5 mg/dL — ABNORMAL LOW (ref 6–20)
CO2: 23 mmol/L (ref 22–32)
Calcium: 8 mg/dL — ABNORMAL LOW (ref 8.9–10.3)
Chloride: 95 mmol/L — ABNORMAL LOW (ref 98–111)
Creatinine, Ser: 0.5 mg/dL (ref 0.44–1.00)
GFR, Estimated: >60 mL/min (ref >60)
Glucose, Bld: 104 mg/dL — ABNORMAL HIGH (ref 70–99)
Potassium: 3.4 mmol/L — ABNORMAL LOW (ref 3.5–5.1)
Sodium: 133 mmol/L — ABNORMAL LOW (ref 135–145)

## 2021-11-29 MED ORDER — BUDESONIDE 0.25 MG/2ML IN SUSP
0.2500 mg | Freq: Two times a day (BID) | RESPIRATORY_TRACT | Status: DC
  Administered 2021-11-29 – 2021-12-01 (×5): 0.25 mg via RESPIRATORY_TRACT
  Filled 2021-11-29 (×5): qty 2

## 2021-11-29 MED ORDER — ARFORMOTEROL TARTRATE 15 MCG/2ML IN NEBU
15.0000 ug | INHALATION_SOLUTION | Freq: Two times a day (BID) | RESPIRATORY_TRACT | Status: DC
  Administered 2021-11-29 – 2021-12-01 (×5): 15 ug via RESPIRATORY_TRACT
  Filled 2021-11-29 (×5): qty 2

## 2021-11-29 MED ORDER — POTASSIUM CHLORIDE CRYS ER 20 MEQ PO TBCR
40.0000 meq | EXTENDED_RELEASE_TABLET | Freq: Once | ORAL | Status: AC
  Administered 2021-11-29: 09:00:00 40 meq via ORAL
  Filled 2021-11-29: qty 2

## 2021-11-29 NOTE — Progress Notes (Signed)
Pt admitted from ED to [place] room, pt and family oriented to room. Skin clean dry and intact with no evidence of breakdown. Vss BP (!) 153/84 (BP Location: Right Arm)    Pulse 100    Temp 98 F (36.7 C) (Oral)    Resp (!) 23    LMP 03/29/2011    SpO2 98%  pt on 4L  O2

## 2021-11-29 NOTE — Consult Note (Signed)
Reason for the request:    Advanced malignancy  HPI: I was asked by Dr. Rogers Blocker to evaluate Tanya Jordan for advanced malignancy likely from a primary lung neoplasm.  She is a 55 year old woman hospitalized on November 13, 2017 where she was found to have a right cerebral hemorrhage and that her work-up showed a tumor that required craniotomy and resection of that tumor.  At that time she had pulmonary nodules suspicious for primary lung neoplasm.  The final pathology showed metastatic carcinoma and has had positive for cytokeratin AE1/AE3, CK8/18 and TTF-1 and CK7.  There is negative for CK20, and others.  This is consistent with a lung primary.  Patient was supposed to follow-up with Dr. Julien Nordmann but was hospitalized again after presenting with severe anemia, shortness of breath and failure to thrive.  Imaging studies showed worsening progression of her intracranial metastasis with new hemorrhagic lesions throughout the bilateral cerebral hemispheres, cerebellum and the left frontal lobe.  She also had significant progression with carcinomatosis mediastinal and hilar nodal metastasis as well as adrenal glands and bilateral pleural effusions.  Clinically, she appears comfortable but quite debilitated.  He is not able to provide much history at this time.  He denies any headaches or chest pain.  He denies any cough or wheezing.  She is hard of hearing and slightly confused.      Past Medical History:  Diagnosis Date   Allergy    COPD (chronic obstructive pulmonary disease) ([place])    Family history of breast cancer    Hypertension    Personal history of colonic polyps   :   Past Surgical History:  Procedure Laterality Date   APPLICATION OF CRANIAL NAVIGATION N/A 11/15/2021   Procedure: APPLICATION OF CRANIAL NAVIGATION;  Surgeon: Judith Part, MD;  Location: [place];  Service: Neurosurgery;  Laterality: N/A;   BREAST BIOPSY Left    CRANIOTOMY Right 11/15/2021   Procedure: Right craniotomy  for tumor resection with brainlab;  Surgeon: Judith Part, MD;  Location: [place];  Service: Neurosurgery;  Laterality: Right;   REVISION OF SCAR ON FACE/HEAD    :   Current Facility-Administered Medications:    0.9 %  sodium chloride infusion, 500 mL, Intravenous, Continuous, Milus Banister, MD   acetaminophen (TYLENOL) tablet 650 mg, 650 mg, Oral, Q6H PRN **OR** acetaminophen (TYLENOL) suppository 650 mg, 650 mg, Rectal, Q6H PRN, Orma Flaming, MD   albuterol (PROVENTIL) (2.5 MG/3ML) 0.083% nebulizer solution 3 mL, 3 mL, Inhalation, Q4H PRN, Orma Flaming, MD   arformoterol (BROVANA) nebulizer solution 15 mcg, 15 mcg, Nebulization, BID, Ghimire, Shanker M, MD   budesonide (PULMICORT) nebulizer solution 0.25 mg, 0.25 mg, Nebulization, BID, Ghimire, Henreitta Leber, MD   cefdinir (OMNICEF) capsule 300 mg, 300 mg, Oral, Q12H, Orma Flaming, MD, 300 mg at 11/29/21 0920   HYDROcodone-acetaminophen (NORCO/VICODIN) 5-325 MG per tablet 1-2 tablet, 1-2 tablet, Oral, Q4H PRN, Orma Flaming, MD   loratadine (CLARITIN) tablet 10 mg, 10 mg, Oral, Daily, Orma Flaming, MD, 10 mg at 11/29/21 0920   metoprolol tartrate (LOPRESSOR) tablet 12.5 mg, 12.5 mg, Oral, BID, Orma Flaming, MD, 12.5 mg at 11/29/21 0920   ondansetron (ZOFRAN) injection 4 mg, 4 mg, Intravenous, Q4H PRN, Charlesetta Shanks, MD   pantoprazole (PROTONIX) injection 40 mg, 40 mg, Intravenous, Q12H, Orma Flaming, MD, 40 mg at 11/29/21 0920   sodium chloride tablet 1 g, 1 g, Oral, BID WC, Orma Flaming, MD   umeclidinium bromide (INCRUSE ELLIPTA) 62.5 MCG/ACT 1 puff, 1  puff, Inhalation, Daily, Orma Flaming, MD  Current Outpatient Medications:    acetaminophen (TYLENOL) 325 MG tablet, Take 650 mg by mouth every 6 (six) hours as needed for mild pain, fever or headache., Disp: , Rfl:    albuterol (VENTOLIN HFA) 108 (90 Base) MCG/ACT inhaler, INHALE 2 PUFFS INTO THE LUNGS EVERY 4 (FOUR) HOURS AS NEEDED FOR WHEEZING OR SHORTNESS OF  BREATH., Disp: 18 g, Rfl: 6   budesonide-formoterol (SYMBICORT) 160-4.5 MCG/ACT inhaler, Inhale 2 puffs into the lungs 2 (two) times daily. (Patient taking differently: Inhale 2 puffs into the lungs 2 (two) times daily as needed (wheezing/shortness of breath).), Disp: 10.2 g, Rfl: 3   cefdinir (OMNICEF) 300 MG capsule, Take 1 capsule (300 mg total) by mouth every 12 (twelve) hours for 7 days., Disp: 14 capsule, Rfl: 0   cetirizine (ZYRTEC) 10 MG tablet, Take 1 tablet (10 mg total) by mouth daily., Disp: 30 tablet, Rfl: 1   fluticasone furoate-vilanterol (BREO ELLIPTA) 200-25 MCG/ACT AEPB, Inhale 1 puff into the lungs daily., Disp: , Rfl:    meclizine (ANTIVERT) 12.5 MG tablet, Take 1 tablet (12.5 mg total) by mouth 3 (three) times daily as needed for up to 10 days for dizziness., Disp: 30 tablet, Rfl: 0   metoprolol tartrate (LOPRESSOR) 25 MG tablet, Take 0.5 tablets (12.5 mg total) by mouth 2 (two) times daily., Disp: 30 tablet, Rfl: 2   sodium chloride 1 g tablet, Take 1 tablet (1 g total) by mouth 2 (two) times daily with a meal for 7 days., Disp: 14 tablet, Rfl: 0   umeclidinium bromide (INCRUSE ELLIPTA) 62.5 MCG/ACT AEPB, Inhale 1 puff into the lungs daily., Disp: , Rfl: :   Allergies  Allergen Reactions   Lisinopril Swelling  :   Family History  Problem Relation Age of Onset   Lung cancer Father    Hypertension Mother    Breast cancer Maternal Grandmother 96   Dementia Maternal Grandmother    Diabetes Maternal Grandfather    Colon cancer Neg Hx    Stomach cancer Neg Hx   :   Social History   Socioeconomic History   Marital status: Divorced    Spouse name: Not on file   Number of children: Not on file   Years of education: Not on file   Highest education level: Not on file  Occupational History   Occupation: Scientist, water quality  Tobacco Use   Smoking status: Every Day    Packs/day: 0.25    Years: 30.00    Pack years: 7.50    Types: Cigarettes   Smokeless tobacco: Never    Tobacco comments:    3-5 cigs daily  Vaping Use   Vaping Use: Never used  Substance and Sexual Activity   Alcohol use: Not Currently    Alcohol/week: 12.0 standard drinks    Types: 12 Cans of beer per week   Drug use: No   Sexual activity: Never  Other Topics Concern   Not on file  Social History Narrative   Not on file   Social Determinants of Health   Financial Resource Strain: Not on file  Food Insecurity: Not on file  Transportation Needs: Not on file  Physical Activity: Not on file  Stress: Not on file  Social Connections: Not on file  Intimate Partner Violence: Not on file  :  Pertinent items are noted in HPI.  Exam: Blood pressure (!) 148/88, pulse 100, temperature 98.2 F (36.8 C), temperature source Oral, resp. rate (!) 24, last  menstrual period 03/29/2011, SpO2 100 %.  General appearance: Chronically ill-appearing without distress. Head: atraumatic without any abnormalities. Eyes: conjunctivae/corneas clear. PERRL.  Sclera anicteric. Throat: lips, mucosa, and tongue normal; without oral thrush or ulcers. Resp: clear to auscultation bilaterally without rhonchi, wheezes or dullness to percussion. Cardio: regular rate and rhythm, S1, S2 normal, no murmur, click, rub or gallop GI: soft, non-tender; bowel sounds normal; no masses,  no organomegaly Skin: Skin color, texture, turgor normal. No rashes or lesions Lymph nodes: Cervical, supraclavicular, and axillary nodes normal. Neurologic: Grossly normal without any motor, sensory or deep tendon reflexes. Musculoskeletal: No joint deformity or effusion.   Recent Labs    11/06/2021 1118 11/29/21 0424  WBC 12.6* 18.1*  HGB 5.7* 11.3*  HCT 17.8* 33.8*  PLT 263 186    Recent Labs    11/21/2021 1118 11/29/21 0424  NA 131* 133*  K 2.6* 3.4*  CL 92* 95*  CO2 28 23  GLUCOSE 112* 104*  BUN <5* 5*  CREATININE 0.50 0.50  CALCIUM 8.0* 8.0*       CT HEAD WO CONTRAST (5MM)  Result Date: 12/02/2021 CLINICAL  DATA:  Short of breath, dizziness, CNS neoplasm, lung cancer EXAM: CT HEAD WITHOUT CONTRAST TECHNIQUE: Contiguous axial images were obtained from the base of the skull through the vertex without intravenous contrast. COMPARISON:  11/16/2021, 11/14/2021 FINDINGS: Brain: Postsurgical changes from right occipital craniotomy with resection of the underlying hemorrhagic metastatic lesion seen previously. There is a small amount of residual blood products within the surgical bed. Continued edema and mass effect, with slight effacement of the fourth ventricle and right basilar cisterns. Since the prior exam, greater than 10 hemorrhagic metastatic lesions have developed throughout the brain parenchyma. The largest within the left frontal lobe measures 1.3 x 1.7 x 1.5 cm reference image 18/3. Mild surrounding vasogenic edema. There is no significant midline shift or mass effect however. The lateral ventricles are unremarkable. There are no acute extra-axial fluid collections. Vascular: No hyperdense vessel or unexpected calcification. Skull: Postsurgical changes from right occipital craniotomy. No acute or destructive bony lesions. Sinuses/Orbits: No acute finding. Other: None. IMPRESSION: 1. Interval progression of intracranial metastases, with multiple new hemorrhagic metastatic lesions throughout the bilateral cerebral hemispheres and cerebellum. Largest metastatic lesion in the left frontal lobe as above. 2. Postsurgical changes from right occipital hemorrhagic metastatic deposit resection, with minimal residual blood products and continued mass effect. Critical Value/emergent results were called by telephone at the time of interpretation on 11/20/2021 at 7:21 pm to provider [facility] , who verbally acknowledged these results. Electronically Signed   By: Randa Ngo M.D.   On: 11/07/2021 19:28   CT Head Wo Contrast  Result Date: 11/13/2021 CLINICAL DATA:  Weakness. EXAM: CT HEAD WITHOUT CONTRAST TECHNIQUE:  Contiguous axial images were obtained from the base of the skull through the vertex without intravenous contrast. COMPARISON:  None. FINDINGS: Brain: 3 x 2 cm intraparenchymal hemorrhage is noted in the right cerebellar hemisphere. Ventricular size is within normal limits. No midline shift is noted. Vascular: No hyperdense vessel or unexpected calcification. Skull: Normal. Negative for fracture or focal lesion. Sinuses/Orbits: No acute finding. Other: None. IMPRESSION: 3 x 2 cm intraparenchymal hemorrhage is noted in right cerebellar hemisphere. Critical Value/emergent results were called by telephone at the time of interpretation on 11/13/2021 at 3:59 pm to provider Theodis Blaze , who verbally acknowledged these results. Electronically Signed   By: Marijo Conception M.D.   On: 11/13/2021 15:59  CT Chest W Contrast  Result Date: 11/13/2021 CLINICAL DATA:  Abnormal chest x-ray EXAM: CT CHEST WITH CONTRAST TECHNIQUE: Multidetector CT imaging of the chest was performed during intravenous contrast administration. CONTRAST:  48mL OMNIPAQUE IOHEXOL 300 MG/ML  SOLN COMPARISON:  Same day chest x-ray FINDINGS: Cardiovascular: Normal heart size. Three-vessel coronary artery calcifications. Atherosclerotic disease of the thoracic aorta. No suspicious filling defects of the central pulmonary arteries. Mediastinum/Nodes: Markedly enlarged right hilar lymph node, measuring up to 2.4 cm in short axis. Mildly enlarged right lower paratracheal lymph node measuring 1.0 cm in short axis on series 2, image 61. Rounded left hilar lymph node with central low density measuring 8 mm on series 2, image 88. Esophagus is unremarkable. Lungs/Pleura: Central airways are patent. Centrilobular emphysema. Spiculated left upper lobe pulmonary nodule measuring 2.5 x 1.5 cm on series 4, image 59 with adjacent satellite nodules measuring 5 mm on image 46 and 6 mm on image 51. Additional spiculated pulmonary nodule is seen more inferiorly in the  right upper lobe which measures 0.9 x 0.7 cm on image 76. Intralobular septal thickening is seen in the peripheral right upper lobe adjacent to the nodules. Solid pulmonary nodule of the left upper lobe measuring 6 mm on series 4 image 70. Upper Abdomen: Nodular thickening of the left adrenal gland. Musculoskeletal: No chest wall abnormality. No acute or significant osseous findings. IMPRESSION: 1. Spiculated solid pulmonary nodules of the right upper lobe, largest measures 2.5 cm, findings are concerning for primary lung malignancy. 2. Intralobular septal thickening is seen in the peripheral right upper lobe adjacent to the nodules, concerning for lymphangitic spread. 3. Solid pulmonary nodule of the left upper lobe measuring 6 mm, concerning for additional site of disease. 4. Markedly enlarged right hilar lymph node and mildly enlarged right lower paratracheal lymph node, concerning for nodal metastatic disease. 5. Subcentimeter left hilar lymph node with rounded morphology, possibly an additional site of metastatic disease. 6. Nodular thickening of the left adrenal gland, concerning for metastatic disease. 7. Aortic Atherosclerosis (ICD10-I70.0) and Emphysema (ICD10-J43.9). Electronically Signed   By: Yetta Glassman M.D.   On: 11/13/2021 14:31    Assessment and Plan:    55 year old woman with:  1.  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  These findings were discussed today with the patient and her family including her daughter that was present at bedside.  She has a marginal performance status that is rapidly declining with widespread malignancy and is also progressing rapidly.  Any treatment at this time would offer very little palliation at this time.  I think she is a marginal candidate for any additional therapy without will be radiation or chemotherapy.  After discussion today, I recommended supportive care only  and transitioning to hospice upon discharge.  I agree with the DNR CODE STATUS.  I agree with have thinking palliative medicine services involved to facilitate her goals of care and transitioning to hospice.   2.  Worsening cranial metastasis: Dexamethasone can be used to alleviate symptoms but otherwise I do not recommend any additional treatment given her limited life expectancy.  3.  Prognosis: Poor with limited life expectancy likely a matter of few months given her overall performance status and disease progression.  4.  Anemia: She is status post transfusion with hemoglobin is adequate at this time.  80  minutes were dedicated to this visit.  50% of time was face-to-face with the patient  and discussing with family.  The time was spent on reviewing laboratory data, imaging studies, discussing treatment options, discussing differential diagnosis and answering questions regarding future plan.

## 2021-11-29 NOTE — Progress Notes (Signed)
°  Transition of Care [facility]) Screening Note   Patient Details  Name: LYNELL GREENHOUSE Date of Birth: [birth year]   [facility]) CM/SW Contact:    Benard Halsted, LCSW Phone Number: 11/29/2021, 3:47 PM    Transition of Care Department [facility]) has reviewed patient and no TOC needs have been identified at this time. We will continue to monitor patient advancement through interdisciplinary progression rounds. If new patient transition needs arise, please place a TOC consult.

## 2021-11-29 NOTE — Progress Notes (Signed)
Oncology Nurse Navigator Documentation  Oncology Nurse Navigator Flowsheets 11/29/2021  Abnormal Finding Date 11/13/2021  Confirmed Diagnosis Date 11/15/2021  Diagnosis Status Pending Molecular Studies  Planned Course of Treatment Radiation;Surgery;Targeted Therapy;Chemotherapy  Phase of Treatment Surgery  Radiation Actual Start Date: 12/05/2021  Surgery Actual Start Date: 11/15/2021  Navigator Follow Up Date: 12/12/2021  Navigator Follow Up Reason: New Patient Appointment  Navigator Location [facility]  Navigator Encounter Type Other:  Treatment Initiated Date 11/15/2021  Patient Visit Type Other  Treatment Phase Other  Barriers/Navigation Needs Coordination of Care/per Dr. Julien Nordmann, molecular and PDL 1 testing sent on recent neurosurgery path to [facility].    Interventions Coordination of Care  Acuity Level 3-Moderate Needs (3-4 Barriers Identified)  Coordination of Care Pathology  Time Spent with Patient 45

## 2021-11-29 NOTE — ED Notes (Signed)
Lunch Ordered °

## 2021-11-29 NOTE — Progress Notes (Signed)
PROGRESS NOTE        PATIENT DETAILS Name: Tanya Jordan Age: 55 y.o. Sex: female Date of Birth: 12/08/1965 Admit Date: 11/26/2021 Admitting Physician Orma Flaming, MD LKT:GYBWLS, Charlane Ferretti, MD  Brief Narrative: Patient is a 55 y.o. female who was recently diagnosed with lung CA-with right cerebellar metastatic lesion-s/p craniotomy and tumor resection on 12/14-presented to the hospital on 12/27 with right arm weakness, fatigue, hemoptysis.  Upon further evaluation-she was found to have more's brain mets, bilateral pleural effusion, hypokalemia and severe anemia.  She was subsequently admitted to the hospitalist service.  Subjective: Right arm weakness is better.  Occasional hemoptysis continues.  She appears comfortable.  Daughter and mother at bedside.  Objective: Vitals: Blood pressure (!) 134/93, pulse 93, temperature 98.2 F (36.8 C), temperature source Oral, resp. rate (!) 24, last menstrual period 03/29/2011, SpO2 100 %.   Exam: Gen Exam:Alert awake-not in any distress HEENT:atraumatic, normocephalic Chest: Diminished air entry at bilateral bases but otherwise clear to auscultation. 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  Patient/family (in the room) understand poor overall prognosis.  We will await further recommendations/goals of care discussion from consultant services.  In the meantime-continue with supportive care.  Hemoptysis/pleural effusion: Due to underlying malignancy-supportive care for now.  Appreciate PCCM input.  S/p right-sided craniotomy and right cerebellar tumor resection on 12/14: Did have some mild right arm weakness on presentation that has since resolved.  Awaiting neurosurgical input.  Hypokalemia: Repleted-recheck tomorrow morning.  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  Normocytic anemia: No history of melena/hematochezia-doubt active GI bleeding-FOBT positive but has been having intermittent hemoptysis.  Suspect anemia is from underlying malignancy.  Hemoglobin stable after 3 units of PRBC transfusion.  Watch closely.  Doubt any role for GI consultation in this setting.  Minimally elevated troponin: Doubt any clinical significance.  Not a candidate for further work-up given advanced malignancy in any event.  COPD with chronic hypoxic respiratory failure: On 4 L of oxygen at baseline.  Recent PNA: Continue with cefdinir- will complete course tomorrow.  PAF: Remains in sinus rhythm-on metoprolol-not a candidate for anticoagulation.  Thyroid nodule: Incidental finding on CT-stable for outpatient work-up.  Tobacco abuse: Counseled-declined nicotine patch.    Goals of care/palliative care discussion: DNR in place-family/patient understands poor prognosis-awaiting oncology/palliative care evaluation for further goals of care discussion.   BMI Estimated body mass index is 20.47 kg/m as calculated from the following:   Height as of 11/13/21: 5\' 4"  (1.626 m).   Weight as of 11/13/21: 54.1 kg.    Procedures: None Consults: Neurosurgery, palliative care, oncology DVT Prophylaxis: SCDs Code Status:DNR Family Communication: Daughter and mother at  bedside  Time spent: 35 minutes-Greater than 50% of this time was spent in counseling, explanation of diagnosis, planning of further management, and coordination of care.   Disposition Plan: Status is: Inpatient  Remains inpatient appropriate because: Worsening cancer burden-with symptomatic anemia/multiple brain mets/hemoptysis-not yet stable for discharge.  Awaiting goals of care conversation        Diet: Diet Order             Diet clear liquid Room service appropriate? Yes; Fluid consistency: Thin  Diet effective now                     Antimicrobial agents: Anti-infectives (From admission, onward)    Start     Dose/Rate Route Frequency Ordered Stop   11/21/2021 2200  cefdinir (OMNICEF) capsule 300 mg        300 mg Oral Every 12 hours 11/14/2021 1846 11/30/21 0959        MEDICATIONS: Scheduled Meds:  cefdinir  300 mg Oral Q12H   fluticasone furoate-vilanterol  1 puff Inhalation Daily   loratadine  10 mg Oral Daily   metoprolol tartrate  12.5 mg Oral BID   pantoprazole (PROTONIX) IV  40 mg Intravenous Q12H   sodium chloride  1 g Oral BID WC   umeclidinium bromide  1 puff Inhalation Daily   Continuous Infusions:  sodium chloride     PRN Meds:.acetaminophen **OR** acetaminophen, albuterol, HYDROcodone-acetaminophen, ondansetron (ZOFRAN) IV   I have personally reviewed following labs and imaging studies  LABORATORY DATA: CBC: Recent Labs  Lab 11/06/2021 1118 11/29/21 0424  WBC 12.6* 18.1*  NEUTROABS 10.9*  --   HGB 5.7* 11.3*  HCT 17.8* 33.8*  MCV 88.1 85.8  PLT 263 315    Basic Metabolic Panel: Recent Labs  Lab 11/19/2021 1118 11/30/2021 1245 11/29/21 0424  NA 131*  --  133*  K 2.6*  --  3.4*  CL 92*  --  95*  CO2 28  --  23  GLUCOSE 112*  --  104*  BUN <5*  --  5*  CREATININE 0.50  --  0.50  CALCIUM 8.0*  --  8.0*  MG  --  1.8  --   PHOS  --  2.7  --     GFR: CrCl cannot be calculated (Unknown ideal weight.).  Liver Function  Tests: Recent Labs  Lab 11/27/2021 1118  AST 18  ALT 16  ALKPHOS 77  BILITOT 0.7  PROT 5.5*  ALBUMIN 2.2*   Recent Labs  Lab 11/24/2021 1118  LIPASE 89*   Recent Labs  Lab 11/16/2021 1245  AMMONIA 18    Coagulation Profile: Recent Labs  Lab 11/07/2021 1118  INR 1.1    Cardiac Enzymes: No results for input(s): CKTOTAL, CKMB, CKMBINDEX, TROPONINI in the last 168 hours.  BNP (last 3 results) No results for input(s): PROBNP in the last 8760 hours.  Lipid Profile: No results for input(s): CHOL, HDL, LDLCALC, TRIG, CHOLHDL, LDLDIRECT in the last 72 hours.  Thyroid Function Tests: No results for input(s): TSH, T4TOTAL, FREET4, T3FREE, THYROIDAB in the last 72 hours.  Anemia Panel: No results for input(s): VITAMINB12, FOLATE, FERRITIN, TIBC, IRON, RETICCTPCT in the last 72 hours.  Urine analysis:    Component Value Date/Time   COLORURINE YELLOW 11/20/2021 1827   APPEARANCEUR CLEAR 11/07/2021 1827   LABSPEC <1.005 (L) 11/15/2021 1827   PHURINE 5.5 11/25/2021 1827   GLUCOSEU NEGATIVE 11/20/2021 1827   HGBUR MODERATE (A) 11/07/2021 1827   BILIRUBINUR NEGATIVE  11/08/2021 1827   KETONESUR 40 (A) 11/04/2021 1827   PROTEINUR NEGATIVE 11/23/2021 1827   NITRITE NEGATIVE 11/10/2021 1827   LEUKOCYTESUR NEGATIVE 11/24/2021 1827    Sepsis Labs: Lactic Acid, Venous    Component Value Date/Time   LATICACIDVEN 1.1 11/25/2021 1245    MICROBIOLOGY: Recent Results (from the past 240 hour(s))  Culture, blood (routine x 2)     Status: None   Collection Time: 11/20/21  1:22 PM   Specimen: BLOOD  Result Value Ref Range Status   Specimen Description BLOOD LEFT ANTECUBITAL  Final   Special Requests   Final    BOTTLES DRAWN AEROBIC AND ANAEROBIC Blood Culture adequate volume   Culture   Final    NO GROWTH 5 DAYS Performed at [facility], [place] [address], [place], [place] [address]    Report Status 11/25/2021 FINAL  Final  Culture, blood (routine x 2)     Status: None    Collection Time: 11/20/21  1:30 PM   Specimen: BLOOD  Result Value Ref Range Status   Specimen Description BLOOD BLOOD LEFT HAND  Final   Special Requests   Final    BOTTLES DRAWN AEROBIC AND ANAEROBIC Blood Culture adequate volume   Culture   Final    NO GROWTH 5 DAYS Performed at [facility], [place] [address] [place], [place] [address]    Report Status 11/25/2021 FINAL  Final  Resp Panel by RT-PCR (Flu A&B, Covid) Nasopharyngeal Swab     Status: None   Collection Time: 11/16/2021 12:21 PM   Specimen: Nasopharyngeal Swab; Nasopharyngeal(NP) swabs in vial transport medium  Result Value Ref Range Status   SARS Coronavirus 2 by RT PCR NEGATIVE NEGATIVE Final    Comment: (NOTE) SARS-CoV-2 target nucleic acids are NOT DETECTED.  The SARS-CoV-2 RNA is generally detectable in upper respiratory specimens during the acute phase of infection. The lowest concentration of SARS-CoV-2 viral copies this assay can detect is 138 copies/mL. A negative result does not preclude SARS-Cov-2 infection and should not be used as the sole basis for treatment or other patient management decisions. A negative result may occur with  improper specimen collection/handling, submission of specimen other than nasopharyngeal swab, presence of viral mutation(s) within the areas targeted by this assay, and inadequate number of viral copies(<138 copies/mL). A negative result must be combined with clinical observations, patient history, and epidemiological information. The expected result is Negative.  Fact Sheet for Patients:  EntrepreneurPulse.com.au  Fact Sheet for Healthcare Providers:  IncredibleEmployment.be  This test is no t yet approved or cleared by the [place] FDA and  has been authorized for detection and/or diagnosis of SARS-CoV-2 by FDA under an Emergency Use Authorization (EUA). This EUA will remain  in effect (meaning this test can be used) for the  duration of the COVID-19 declaration under Section 564(b)(1) of the Act, 21 U.S.C.section 360bbb-3(b)(1), unless the authorization is terminated  or revoked sooner.       Influenza A by PCR NEGATIVE NEGATIVE Final   Influenza B by PCR NEGATIVE NEGATIVE Final    Comment: (NOTE) The Xpert Xpress SARS-CoV-2/FLU/RSV plus assay is intended as an aid in the diagnosis of influenza from Nasopharyngeal swab specimens and should not be used as a sole basis for treatment. Nasal washings and aspirates are unacceptable for Xpert Xpress SARS-CoV-2/FLU/RSV testing.  Fact Sheet for Patients: EntrepreneurPulse.com.au  Fact Sheet for Healthcare Providers: IncredibleEmployment.be  This test is not yet approved or cleared by the [place]  States FDA and has been authorized for detection and/or diagnosis of SARS-CoV-2 by FDA under an Emergency Use Authorization (EUA). This EUA will remain in effect (meaning this test can be used) for the duration of the COVID-19 declaration under Section 564(b)(1) of the Act, 21 U.S.C. section 360bbb-3(b)(1), unless the authorization is terminated or revoked.  Performed at [facility], [place] [address] [place], [place] [address]   Culture, blood (routine x 2)     Status: None (Preliminary result)   Collection Time: 11/20/2021 12:45 PM   Specimen: BLOOD  Result Value Ref Range Status   Specimen Description BLOOD RIGHT ANTECUBITAL  Final   Special Requests   Final    BOTTLES DRAWN AEROBIC AND ANAEROBIC Blood Culture results may not be optimal due to an inadequate volume of blood received in culture bottles   Culture   Final    NO GROWTH < 24 HOURS Performed at [facility], [place] [address] [place], [place] [address]    Report Status PENDING  Incomplete  Culture, blood (routine x 2)     Status: None (Preliminary result)   Collection Time: 11/05/2021 12:49 PM   Specimen: BLOOD  Result Value Ref Range Status   Specimen  Description BLOOD LEFT ANTECUBITAL  Final   Special Requests   Final    BOTTLES DRAWN AEROBIC AND ANAEROBIC Blood Culture adequate volume   Culture   Final    NO GROWTH < 24 HOURS Performed at [facility], [place] [address], [place], [place] [address]    Report Status PENDING  Incomplete    RADIOLOGY STUDIES/RESULTS: DG Chest 2 View  Result Date: 11/25/2021 CLINICAL DATA:  Shortness of breath EXAM: CHEST - 2 VIEW COMPARISON:  11/20/2021 radiographs and 11/13/2021 CT chest FINDINGS: Unchanged cardiac and mediastinal contours. Nodular opacity in the right upper lobe, which correlates with the known right upper lobe mass as well as likely the associated interlobular septal thickening seen on the prior CT, which has increased since the prior exam. Increased fullness in the right hilum. Trace right pleural effusion. Small left pleural effusion.  The left lung is otherwise clear. No acute osseous abnormality IMPRESSION: 1. Redemonstrated nodular opacity in the right upper lobe, correlating with the mass seen on the prior CT, with interval increase in surrounding opacities, likely related to the previously noted interlobular septal thickening. 2. Small left and trace right pleural effusions. 3. Increased right hilar fullness, likely worsening lymphadenopathy. Electronically Signed   By: Merilyn Baba M.D.   On: 11/24/2021 11:50   CT HEAD WO CONTRAST (5MM)  Result Date: 11/20/2021 CLINICAL DATA:  Short of breath, dizziness, CNS neoplasm, lung cancer EXAM: CT HEAD WITHOUT CONTRAST TECHNIQUE: Contiguous axial images were obtained from the base of the skull through the vertex without intravenous contrast. COMPARISON:  11/16/2021, 11/14/2021 FINDINGS: Brain: Postsurgical changes from right occipital craniotomy with resection of the underlying hemorrhagic metastatic lesion seen previously. There is a small amount of residual blood products within the surgical bed. Continued edema and mass effect, with  slight effacement of the fourth ventricle and right basilar cisterns. Since the prior exam, greater than 10 hemorrhagic metastatic lesions have developed throughout the brain parenchyma. The largest within the left frontal lobe measures 1.3 x 1.7 x 1.5 cm reference image 18/3. Mild surrounding vasogenic edema. There is no significant midline shift or mass effect however. The lateral ventricles are unremarkable. There are no acute extra-axial fluid collections. Vascular: No hyperdense vessel or unexpected  calcification. Skull: Postsurgical changes from right occipital craniotomy. No acute or destructive bony lesions. Sinuses/Orbits: No acute finding. Other: None. IMPRESSION: 1. Interval progression of intracranial metastases, with multiple new hemorrhagic metastatic lesions throughout the bilateral cerebral hemispheres and cerebellum. Largest metastatic lesion in the left frontal lobe as above. 2. Postsurgical changes from right occipital hemorrhagic metastatic deposit resection, with minimal residual blood products and continued mass effect. Critical Value/emergent results were called by telephone at the time of interpretation on 11/04/2021 at 7:21 pm to provider [facility] , who verbally acknowledged these results. Electronically Signed   By: Randa Ngo M.D.   On: 11/04/2021 19:28   CT Angio Chest/Abd/Pel for Dissection W and/or W/WO  Result Date: 11/08/2021 CLINICAL DATA:  Shortness of breath and dizziness. History of lung cancer. EXAM: CT ANGIOGRAPHY CHEST, ABDOMEN AND PELVIS TECHNIQUE: Non-contrast CT of the chest was initially obtained. Multidetector CT imaging through the chest, abdomen and pelvis was performed using the standard protocol during bolus administration of intravenous contrast. Multiplanar reconstructed images and MIPs were obtained and reviewed to evaluate the vascular anatomy. CONTRAST:  75mL OMNIPAQUE IOHEXOL 350 MG/ML SOLN COMPARISON:  CT chest dated November 13, 2021. FINDINGS:  CTA CHEST FINDINGS Cardiovascular: Preferential opacification of the thoracic aorta. No evidence of thoracic aortic aneurysm or dissection. Coronary, aortic arch, and branch vessel atherosclerotic vascular disease. Normal heart size. Unchanged trace pericardial effusion. No pulmonary embolism. Unchanged narrowing of the right upper lobe pulmonary artery due to hilar lymphadenopathy. Mediastinum/Nodes: New conglomerate right paratracheal lymphadenopathy measuring up to 3.4 cm in short axis. Marked interval enlargement of a lower right paratracheal lymph node currently measuring 2.4 cm in short axis, previously 1.0 cm. Interval enlargement of a left hilar lymph node currently measuring 1.8 cm in short axis, previously 0.8 cm. Slight interval increase in size of an enlarged right hilar lymph node currently measuring 2.5 cm in short axis, previously 2.4 cm. New subcarinal lymphadenopathy measuring up to 1.8 cm in short axis. Multiple thyroid nodules are unchanged, including a 1.1 cm mixed cystic and solid nodule in the right thyroid lobe. Lungs/Pleura: New moderate to large bilateral pleural effusions. Dominant cavitary nodule in the right upper lobe currently measures 2.9 x 2.1 cm, previously 2.4 x 1.4 cm. Smaller satellite nodules in the right upper lobe have also mildly increased in size. Increased perilymphatic nodularity in the right upper lobe. Left greater than right lower lobe atelectasis. Mild dependent subsegmental atelectasis in the posterior left upper lobe. Unchanged 6 mm nodule in the anterior left upper lobe. No pneumothorax. Musculoskeletal: No chest wall abnormality. No acute or significant osseous findings. Significant Review of the MIP images confirms the above findings. CTA ABDOMEN AND PELVIS FINDINGS VASCULAR Aorta: Normal caliber aorta without aneurysm, dissection, vasculitis or significant stenosis. Moderate to severe atherosclerotic calcification. Celiac: Patent without evidence of aneurysm,  dissection, vasculitis or significant stenosis. SMA: Patent without evidence of aneurysm, dissection, vasculitis or significant stenosis. Renals: Two right and single left renal arteries are patent without evidence of aneurysm, dissection, vasculitis, fibromuscular dysplasia or significant stenosis. IMA: Patent without evidence of aneurysm, dissection, vasculitis or significant stenosis. Inflow: Patent without evidence of aneurysm, dissection, vasculitis or significant stenosis. Veins: No obvious venous abnormality within the limitations of this arterial phase study. Review of the MIP images confirms the above findings. NON-VASCULAR Hepatobiliary: No focal liver abnormality is seen. No gallstones, gallbladder wall thickening, or biliary dilatation. Pancreas: New 2.5 x 2.1 cm ill-defined hypodense mass in the pancreatic tail (series 6, image  146). No pancreatic ductal dilatation or surrounding inflammatory changes. Spleen: Normal in size without focal abnormality. Adrenals/Urinary Tract: Significant interval increase in size of the left adrenal mass currently measuring 4.4 x 3.4 cm, previously 1.5 x 1.5 cm. New right adrenal mass measuring 3.9 x 1.8 cm. Normal kidneys. No renal calculi or hydronephrosis. The bladder is unremarkable for the degree of distention. Stomach/Bowel: Stomach is within normal limits. Appendix appears normal. No evidence of bowel wall thickening, distention, or inflammatory changes. Lymphatic: No enlarged abdominal or pelvic lymph nodes. Reproductive: Uterus and bilateral adnexa are unremarkable. Other: No free fluid or pneumoperitoneum. Musculoskeletal: No acute or significant osseous findings. Review of the MIP images confirms the above findings. IMPRESSION: 1. No evidence of acute aortic syndrome. 2. Significant lung cancer disease progression, with worsened lymphangitic carcinomatosis, mediastinal and hilar nodal metastases, and distant metastatic disease involving both adrenal glands and  the pancreas. 3. New moderate to large bilateral pleural effusions. 4. Heterogeneous 1.1 cm right thyroid nodule.  Recommend thyroid US. 5. Aortic Atherosclerosis (ICD10-I70.0). Electronically Signed   By: Titus Dubin M.D.   On: 11/06/2021 13:51     LOS: 1 day   Oren Binet, MD  [facility]    To contact the attending provider between 7A-7P or the covering provider during after hours 7P-7A, please log into the web site [email or web address] and access using universal [place] password for that web site. If you do not have the password, please call the hospital operator.  11/29/2021, 10:41 AM

## 2021-11-29 NOTE — ED Notes (Signed)
Palliative care to bedside

## 2021-11-29 NOTE — ED Notes (Signed)
Daughter Olivia Mackie) would like to called by Palliative care and Oncology when they round.

## 2021-11-29 NOTE — Consult Note (Signed)
° °  [facility] Inpatient Consult   11/29/2021  Tanya Jordan [birth year] [id number]  [facility]] Patient: [facility]  Primary Care Provider:  Charlott Rakes, MD, [facility] and Wellness  Patient screened for less than 7 days readmission hospitalization with noted high risk score for unplanned readmission risk and  to assess for potential [place] Management service needs for post hospital transition.    Plan:  Continue to follow progress and disposition to assess for post hospital care management needs.    For questions contact:   Natividad Brood, RN BSN [facility] Liaison  [phone] business mobile phone Toll free office [phone]  Fax number: [phone] [email or web address]

## 2021-11-29 NOTE — Consult Note (Addendum)
Consultation Note Date: 11/29/2021   Patient Name: Tanya Jordan  DOB: 05-Apr-1966  MRN: [id number]  Age / Sex: 55 y.o., female  PCP: Charlott Rakes, MD Referring Physician: Jonetta Osgood, MD  Reason for Consultation: Establishing goals of care, "stage IV lung cancer"  HPI/Patient Profile: 55 y.o. female  with past medical history of COPD, alcohol abuse, allergies, HTN, metastatic lung cancer with chronic respiratory failure on 4L oxygen presented to ED on 11/11/2021 from home with complaints of shortness of breath and dizziness. Of note, patient was recently hospitalized from 12/12-12/21/22 for ICH - during this hospitalization on 11/15/21 she underwent craniotomy and tumor resection with plan to follow up/start outpatient radiation in January 2023. Patient was admitted on 11/10/2021 with symptomatic anemia, metastatic primary lung cancer, ICH s/p craniotomy and tumor resection, hypokalemia, hyponatremia, elevated troponin, thyroid nodule.   ED Course: vitals: afebrile, bp: 96/86, HR: 107, RR: 18, oxygen: 96% 4L [place],  Pertinent labs: wbc: 12.6, hgb: 5.7/hct: 17.8, potassium: 2.6, sodium: 131, albumin: 2.2, troponin 53>49, magnesium: 1.8, fecal occult: positive  CXR: re-demonstrated nodular opacity in the right upper lobe, correlating with the mass seen on prior on the CT with interval increase in surrounding opacities. Small left and trace pleural effusion, increased right hilar fullness, likely worsening lymphadenopathy.  CTA chest/abdo/pelvis: moderate to large bilateral pleural effusions. Dominant cavitary nodule in RUL currently measures 2.9x2.1cm previously 2.4x1.4cm. smaller satellite nodules in RUL have also mildly increased in size.  New 2.5x2.1cm ill defined hypodense mass in pancreatic tail.  Increase in left adrenal mass and new right adrenal mass.  1.1cm thyroid nodule: recommend thyroid US   In  ED: given 57mq potassium IV, blood cultures obtained, and 3 units of PRBC ordered.   Clinical Assessment and Goals of Care: I have reviewed medical records including EPIC notes, labs, and imaging. Received report from primary RN - no acute concerns.   Went to visit patient at bedside - daughter/Jodi LPiankaand son in law/Eric CWilkesonpresent. Patient was lying in bed awake, alert, oriented, and able to participate in conversation. No signs or non-verbal gestures of pain or discomfort noted. No respiratory distress, increased work of breathing, or secretions noted. She endorses pain in her back but it is not concerning for her at this time.  Met with patient and family  to discuss diagnosis, prognosis, GOC, EOL wishes, disposition, and options.  I introduced Palliative Medicine as specialized medical care for people living with serious illness. It focuses on providing relief from the symptoms and stress of a serious illness. The goal is to improve quality of life for both the patient and the family.  Patient and JLeveda Annarequest other daughter/Tracy be involved in conversation today. Called TOlivia Mackievia sMcDermottwould prefer to meet with PMT in person for GSeminolemeeting and would like to bring patient's mother. PMT will call TOlivia Mackietomorrow to finalize time.   Discussed the difference between aggressive medical intervention and comfort care with patient and family at bedside per their request.  Patient confirms decision for DNR/DNI.  Discussed with patient/family the importance of continued conversation with each other and the medical providers regarding overall plan of care and treatment options, ensuring decisions are within the context of the patients values and GOCs.    Questions and concerns were addressed. The patient/family was encouraged to call with questions and/or concerns. PMT card/number was provided.  Primary Decision Maker: PATIENT    SUMMARY OF RECOMMENDATIONS   Continue  to treat the treatable  Continue DNR/DNI Patient/family in-person meeting for full GOC scheduled for tomorrow - PMT to call daughter/Tracy in the morning to finalize time PMT will continue to follow and support holistically  Code Status/Advance Care Planning: DNR   Palliative Prophylaxis:  Aspiration, Bowel Regimen, Delirium Protocol, Frequent Pain Assessment, Oral Care, and Turn Reposition  Additional Recommendations (Limitations, Scope, Preferences): Full Scope Treatment and No Tracheostomy  Psycho-social/Spiritual:  Created space and opportunity for patient and family to express thoughts and feelings regarding patient's current medical situation.  Emotional support and therapeutic listening provided.  Prognosis:  Unable to determine  Discharge Planning: To Be Determined      Primary Diagnoses: Present on Admission:  Symptomatic anemia  COPD with chronic bronchitis (HCC)  Essential hypertension  Metastatic primary lung cancer (HCC)  Hypokalemia  Elevated troponin  Hyponatremia  Chronic respiratory failure (HCC)  Dysuria  AF (paroxysmal atrial fibrillation) (HCC)  Tobacco abuse   I have reviewed the medical record, interviewed the patient and family, and examined the patient. The following aspects are pertinent.  Past Medical History:  Diagnosis Date   Allergy    COPD (chronic obstructive pulmonary disease) (HCC)    Family history of breast cancer    Hypertension    Personal history of colonic polyps    Social History   Socioeconomic History   Marital status: Divorced    Spouse name: Not on file   Number of children: Not on file   Years of education: Not on file   Highest education level: Not on file  Occupational History   Occupation: Scientist, water quality  Tobacco Use   Smoking status: Every Day    Packs/day: 0.25    Years: 30.00    Pack years: 7.50    Types: Cigarettes   Smokeless tobacco: Never   Tobacco comments:    3-5 cigs daily  Vaping Use   Vaping  Use: Never used  Substance and Sexual Activity   Alcohol use: Not Currently    Alcohol/week: 12.0 standard drinks    Types: 12 Cans of beer per week   Drug use: No   Sexual activity: Never  Other Topics Concern   Not on file  Social History Narrative   Not on file   Social Determinants of Health   Financial Resource Strain: Not on file  Food Insecurity: Not on file  Transportation Needs: Not on file  Physical Activity: Not on file  Stress: Not on file  Social Connections: Not on file   Family History  Problem Relation Age of Onset   Lung cancer Father    Hypertension Mother    Breast cancer Maternal Grandmother 96   Dementia Maternal Grandmother    Diabetes Maternal Grandfather    Colon cancer Neg Hx    Stomach cancer Neg Hx    Scheduled Meds:  arformoterol  15 mcg Nebulization BID   budesonide (PULMICORT) nebulizer solution  0.25 mg Nebulization BID   cefdinir  300 mg Oral Q12H   loratadine  10 mg Oral Daily  metoprolol tartrate  12.5 mg Oral BID   pantoprazole (PROTONIX) IV  40 mg Intravenous Q12H   sodium chloride  1 g Oral BID WC   umeclidinium bromide  1 puff Inhalation Daily   Continuous Infusions:  sodium chloride     PRN Meds:.acetaminophen **OR** acetaminophen, albuterol, HYDROcodone-acetaminophen, ondansetron (ZOFRAN) IV Medications Prior to Admission:  Prior to Admission medications   Medication Sig Start Date End Date Taking? Authorizing Provider  acetaminophen (TYLENOL) 325 MG tablet Take 650 mg by mouth every 6 (six) hours as needed for mild pain, fever or headache.   Yes [provider]  albuterol (VENTOLIN HFA) 108 (90 Base) MCG/ACT inhaler INHALE 2 PUFFS INTO THE LUNGS EVERY 4 (FOUR) HOURS AS NEEDED FOR WHEEZING OR SHORTNESS OF BREATH. 03/28/21 03/28/22 Yes Newlin, Charlane Ferretti, MD  budesonide-formoterol (SYMBICORT) 160-4.5 MCG/ACT inhaler Inhale 2 puffs into the lungs 2 (two) times daily. Patient taking differently: Inhale 2 puffs into the lungs  2 (two) times daily as needed (wheezing/shortness of breath). 03/28/21  Yes Charlott Rakes, MD  cefdinir (OMNICEF) 300 MG capsule Take 1 capsule (300 mg total) by mouth every 12 (twelve) hours for 7 days. 11/22/21 11/29/21 Yes Dahal, Marlowe Aschoff, MD  cetirizine (ZYRTEC) 10 MG tablet Take 1 tablet (10 mg total) by mouth daily. 01/12/20  Yes Newlin, Enobong, MD  fluticasone furoate-vilanterol (BREO ELLIPTA) 200-25 MCG/ACT AEPB Inhale 1 puff into the lungs daily.   Yes [provider]  meclizine (ANTIVERT) 12.5 MG tablet Take 1 tablet (12.5 mg total) by mouth 3 (three) times daily as needed for up to 10 days for dizziness. 11/22/21 12/02/21 Yes Dahal, Marlowe Aschoff, MD  metoprolol tartrate (LOPRESSOR) 25 MG tablet Take 0.5 tablets (12.5 mg total) by mouth 2 (two) times daily. 11/22/21 02/20/22 Yes Dahal, Marlowe Aschoff, MD  sodium chloride 1 g tablet Take 1 tablet (1 g total) by mouth 2 (two) times daily with a meal for 7 days. 11/22/21 11/29/21 Yes Dahal, Marlowe Aschoff, MD  umeclidinium bromide (INCRUSE ELLIPTA) 62.5 MCG/ACT AEPB Inhale 1 puff into the lungs daily.   Yes [provider]   Allergies  Allergen Reactions   Lisinopril Swelling   Review of Systems  Constitutional:  Positive for activity change and fatigue.  Respiratory:  Positive for shortness of breath.   Gastrointestinal:  Negative for nausea and vomiting.  Neurological:  Positive for weakness.  All other systems reviewed and are negative.  Physical Exam Vitals and nursing note reviewed.  Constitutional:      General: She is not in acute distress.    Appearance: She is ill-appearing.  Pulmonary:     Effort: No respiratory distress.  Skin:    General: Skin is warm and dry.  Neurological:     Mental Status: She is alert and oriented to person, place, and time.     Motor: Weakness present.  Psychiatric:        Attention and Perception: Attention normal.        Behavior: Behavior is cooperative.        Cognition and Memory: Cognition  and memory normal.    Vital Signs: BP (!) 148/88    Pulse 100    Temp 98.2 F (36.8 C) (Oral)    Resp (!) 24    LMP 03/29/2011    SpO2 100%  Pain Scale: 0-10   Pain Score: 0-No pain   SpO2: SpO2: 100 % O2 Device:SpO2: 100 % O2 Flow Rate: .O2 Flow Rate (L/min): 4 L/min  IO: Intake/output summary:  Intake/Output  Summary (Last 24 hours) at 11/29/2021 1425 Last data filed at 11/29/2021 0218 Gross per 24 hour  Intake 1335 ml  Output --  Net 1335 ml    LBM:   Baseline Weight:   Most recent weight:       Palliative Assessment/Data: PPS 50%     Time In: 1430 Time Out: 1520 Time Total: 50 minutes  Greater than 50%  of this time was spent counseling and coordinating care related to the above assessment and plan.  Signed by: Lin Landsman, NP   Please contact Palliative Medicine Team phone at [phone] for questions and concerns.  For individual provider: See Shea Evans

## 2021-11-30 ENCOUNTER — Encounter: Payer: Self-pay | Admitting: *Deleted

## 2021-11-30 DIAGNOSIS — J9610 Chronic respiratory failure, unspecified whether with hypoxia or hypercapnia: Secondary | ICD-10-CM | POA: Diagnosis not present

## 2021-11-30 DIAGNOSIS — J449 Chronic obstructive pulmonary disease, unspecified: Secondary | ICD-10-CM | POA: Diagnosis not present

## 2021-11-30 DIAGNOSIS — D649 Anemia, unspecified: Secondary | ICD-10-CM | POA: Diagnosis not present

## 2021-11-30 DIAGNOSIS — I480 Paroxysmal atrial fibrillation: Secondary | ICD-10-CM | POA: Diagnosis not present

## 2021-11-30 DIAGNOSIS — I629 Nontraumatic intracranial hemorrhage, unspecified: Secondary | ICD-10-CM | POA: Diagnosis not present

## 2021-11-30 DIAGNOSIS — C3490 Malignant neoplasm of unspecified part of unspecified bronchus or lung: Secondary | ICD-10-CM | POA: Diagnosis not present

## 2021-11-30 DIAGNOSIS — R0600 Dyspnea, unspecified: Secondary | ICD-10-CM

## 2021-11-30 LAB — CBC
HCT: 30.8 % — ABNORMAL LOW (ref 36.0–46.0)
Hemoglobin: 10.5 g/dL — ABNORMAL LOW (ref 12.0–15.0)
MCH: 28.5 pg (ref 26.0–34.0)
MCHC: 34.1 g/dL (ref 30.0–36.0)
MCV: 83.7 fL (ref 80.0–100.0)
Platelets: 156 10*3/uL (ref 150–400)
RBC: 3.68 MIL/uL — ABNORMAL LOW (ref 3.87–5.11)
RDW: 14.6 % (ref 11.5–15.5)
WBC: 18.2 10*3/uL — ABNORMAL HIGH (ref 4.0–10.5)
nRBC: 0.1 % (ref 0.0–0.2)

## 2021-11-30 LAB — BASIC METABOLIC PANEL
Anion gap: 16 — ABNORMAL HIGH (ref 5–15)
BUN: 6 mg/dL (ref 6–20)
CO2: 19 mmol/L — ABNORMAL LOW (ref 22–32)
Calcium: 8 mg/dL — ABNORMAL LOW (ref 8.9–10.3)
Chloride: 93 mmol/L — ABNORMAL LOW (ref 98–111)
Creatinine, Ser: 0.54 mg/dL (ref 0.44–1.00)
GFR, Estimated: >60 mL/min (ref >60)
Glucose, Bld: 132 mg/dL — ABNORMAL HIGH (ref 70–99)
Potassium: 3.4 mmol/L — ABNORMAL LOW (ref 3.5–5.1)
Sodium: 128 mmol/L — ABNORMAL LOW (ref 135–145)

## 2021-11-30 LAB — MAGNESIUM: Magnesium: 1.8 mg/dL (ref 1.7–2.4)

## 2021-11-30 MED ORDER — GLYCOPYRROLATE 0.2 MG/ML IJ SOLN: 0.2000 mg | INTRAMUSCULAR | Status: DC | PRN

## 2021-11-30 MED ORDER — METOPROLOL TARTRATE 5 MG/5ML IV SOLN
5.0000 mg | Freq: Once | INTRAVENOUS | Status: AC
  Administered 2021-11-30: 06:00:00 5 mg via INTRAVENOUS
  Filled 2021-11-30: qty 5

## 2021-11-30 MED ORDER — MAGNESIUM SULFATE IN D5W 1-5 GM/100ML-% IV SOLN
1.0000 g | Freq: Once | INTRAVENOUS | Status: AC
  Administered 2021-11-30: 04:00:00 1 g via INTRAVENOUS
  Filled 2021-11-30: qty 100

## 2021-11-30 MED ORDER — HALOPERIDOL LACTATE 5 MG/ML IJ SOLN: 0.5000 mg | INTRAMUSCULAR | Status: DC | PRN

## 2021-11-30 MED ORDER — HALOPERIDOL 0.5 MG PO TABS
0.5000 mg | ORAL_TABLET | ORAL | Status: DC | PRN
  Filled 2021-11-30: qty 1

## 2021-11-30 MED ORDER — POTASSIUM CHLORIDE CRYS ER 20 MEQ PO TBCR
40.0000 meq | EXTENDED_RELEASE_TABLET | Freq: Once | ORAL | Status: AC
  Administered 2021-11-30: 05:00:00 40 meq via ORAL
  Filled 2021-11-30: qty 2

## 2021-11-30 MED ORDER — GUAIFENESIN-DM 100-10 MG/5ML PO SYRP
5.0000 mL | ORAL_SOLUTION | ORAL | Status: DC | PRN
  Administered 2021-11-30: 05:00:00 5 mL via ORAL
  Filled 2021-11-30: qty 5

## 2021-11-30 MED ORDER — LORAZEPAM 2 MG/ML IJ SOLN: 1.0000 mg | INTRAMUSCULAR | Status: DC | PRN

## 2021-11-30 MED ORDER — METOPROLOL TARTRATE 25 MG PO TABS
25.0000 mg | ORAL_TABLET | Freq: Three times a day (TID) | ORAL | Status: DC
  Administered 2021-11-30 – 2021-12-01 (×4): 25 mg via ORAL
  Filled 2021-11-30 (×4): qty 1

## 2021-11-30 MED ORDER — LORAZEPAM 1 MG PO TABS: 1.0000 mg | ORAL_TABLET | ORAL | Status: DC | PRN

## 2021-11-30 MED ORDER — POTASSIUM CHLORIDE 10 MEQ/100ML IV SOLN
10.0000 meq | INTRAVENOUS | Status: AC
  Administered 2021-11-30 (×2): 10 meq via INTRAVENOUS
  Filled 2021-11-30 (×2): qty 100

## 2021-11-30 MED ORDER — MORPHINE SULFATE (CONCENTRATE) 10 MG/0.5ML PO SOLN
2.5000 mg | Freq: Four times a day (QID) | ORAL | Status: DC
  Administered 2021-11-30 – 2021-12-01 (×4): 2.6 mg via SUBLINGUAL
  Filled 2021-11-30 (×4): qty 0.5

## 2021-11-30 MED ORDER — LORAZEPAM 2 MG/ML PO CONC: 1.0000 mg | ORAL | Status: DC | PRN

## 2021-11-30 MED ORDER — METOPROLOL TARTRATE 5 MG/5ML IV SOLN
5.0000 mg | Freq: Once | INTRAVENOUS | Status: AC
  Administered 2021-11-30: 02:00:00 5 mg via INTRAVENOUS
  Filled 2021-11-30: qty 5

## 2021-11-30 MED ORDER — HALOPERIDOL LACTATE 2 MG/ML PO CONC
0.5000 mg | ORAL | Status: DC | PRN
  Filled 2021-11-30: qty 0.3

## 2021-11-30 MED ORDER — MORPHINE SULFATE (PF) 2 MG/ML IV SOLN
1.0000 mg | INTRAVENOUS | Status: DC | PRN
  Administered 2021-12-01: 06:00:00 2 mg via INTRAVENOUS
  Filled 2021-11-30: qty 1

## 2021-11-30 MED ORDER — BIOTENE DRY MOUTH MT LIQD: 15.0000 mL | OROMUCOSAL | Status: DC | PRN

## 2021-11-30 MED ORDER — GLYCOPYRROLATE 1 MG PO TABS
1.0000 mg | ORAL_TABLET | ORAL | Status: DC | PRN
  Filled 2021-11-30: qty 1

## 2021-11-30 MED ORDER — POLYVINYL ALCOHOL 1.4 % OP SOLN
1.0000 [drp] | Freq: Four times a day (QID) | OPHTHALMIC | Status: DC | PRN
  Filled 2021-11-30: qty 15

## 2021-11-30 NOTE — Progress Notes (Addendum)
PT Cancellation/Discharge Note  Patient Details Name: Tanya Jordan MRN: [id number] DOB: February 11, 1966   Cancelled Treatment:    Reason Eval/Treat Not Completed: Medical issues which prohibited therapy  Currently in afib with HR 135 at rest. Noted Palliative Consult pending with Oncology recommending Hospice. Will defer evaluation at this time and proceed when medically appropriate.   Addendum- Spoke with Dr. Sloan Leiter and he agreed pt not appropriate for PT evaluation and to discontinue order.    Arby Barrette, PT Acute Rehabilitation Services  Pager [phone] Office [phone]   Rexanne Mano 11/30/2021, 9:06 AM

## 2021-11-30 NOTE — Progress Notes (Incomplete)
Location/Histology of Brain Tumor: Right Cerebellar  Patient presented with symptoms of severe headache.  CT Head 11/08/2021: Postsurgical changes from right occipital craniotomy with resection of the underlying hemorrhagic metastatic lesion seen previously.  Since the prior exam, greater than 10 hemorrhagic metastatic lesions have developed throughout the brain parenchyma. The largest within the left frontal lobe measures 1.3 x 1.7 x 1.5 cm reference image 18/3. Mild surrounding vasogenic edema. There is no significant midline shift or mass effect however.  MRI Brain 11/16/2021: Resected cerebellar mass without complicating feature or visible residual.  MRI Brain 11/14/2021: Known hematoma in the anterior right cerebellum which shows thin mural enhancement and wispy anterior and internal enhancement.  Overall dimensions are 31 x 20 x 20 mm on postcontrast imaging. No second lesion is seen.  CT Head 11/13/2021: 3 x 2 cm intraparenchymal hemorrhage is noted in right cerebellar hemisphere.  CT Chest 11/13/2021: Spiculated solid pulmonary nodules of the right upper lobe, largest measures 2.5 cm, findings are concerning for primary lung malignancy.  Intralobular septal thickening is seen in the peripheral right upper lobe adjacent to the nodules, concerning for lymphangitic spread.  Solid pulmonary nodule of the left upper lobe measuring 6 mm, concerning for additional site of disease.  Markedly enlarged right hilar lymph node and mildly enlarged right lower paratracheal lymph node, concerning for nodal metastatic disease.  Sub-centimeter left hilar lymph node with rounded morphology, possibly an additional site of metastatic disease.  Nodular thickening of the left adrenal gland, concerning for metastatic disease.  Past or anticipated interventions, if any, per neurosurgery:  Dr. Zada Finders 11/30/2021 -MRI pending, [facility] reviewed, unfortunately with multiple new hemorrhagic metastases, really  unfortunate given how quickly they've popped up since her last scan just 14 days ago. Will need to consider WBRT, looks like hospice eval already pending, which I think is reasonable. Will review the MRI when completed and re-evaluate accordingly.  -Right Craniotomy for tumor resection 11/15/2021   Past or anticipated interventions, if any, per medical oncology:   Dose of Decadron, if applicable:   Recent neurologic symptoms, if any:  Seizures:  Headaches:  Nausea:  Dizziness/ataxia:  Difficulty with hand coordination:  Focal numbness/weakness:  Visual deficits/changes:  Confusion/Memory deficits:    Tobacco/Marijuana/Snuff/ETOH use: Current Smoker  Signs/Symptoms Weight changes, if any:  Respiratory complaints, if any:  Hemoptysis, if any:  Pain issues, if any:     SAFETY ISSUES: Prior radiation?  Pacemaker/ICD?  Possible current pregnancy? Post menopausal Is the patient on methotrexate?   Additional Complaints / other details:  Hospice Eval pending

## 2021-11-30 NOTE — Progress Notes (Signed)
°   11/30/21 0147  Assess: MEWS Score  Temp 97.8 F (36.6 C)  BP 129/85  Pulse Rate (!) 149  ECG Heart Rate (!) 146  Resp (!) 26  Level of Consciousness Alert  SpO2 96 %  Assess: MEWS Score  MEWS Temp 0  MEWS Systolic 0  MEWS Pulse 3  MEWS RR 2  MEWS LOC 0  MEWS Score 5  MEWS Score Color Red  Assess: if the MEWS score is Yellow or Red  Were vital signs taken at a resting state? Yes  Focused Assessment Change from prior assessment (see assessment flowsheet)  Early Detection of Sepsis Score *See Row Information* Medium  MEWS guidelines implemented *See Row Information* Yes  Treat  MEWS Interventions Escalated (See documentation below)  Pain Scale 0-10  Pain Score 0  Take Vital Signs  Increase Vital Sign Frequency  Red: Q 1hr X 4 then Q 4hr X 4, if remains red, continue Q 4hrs  Escalate  MEWS: Escalate Red: discuss with charge nurse/RN and provider, consider discussing with RRT  Notify: Charge Nurse/RN  Name of Charge Nurse/RN Notified Wenda Overland, RN  Date Charge Nurse/RN Notified 11/30/21  Time Charge Nurse/RN Notified 0147  Notify: Provider  Provider Name/Title Neomia Glass, NP  Date Provider Notified 11/30/21  Time Provider Notified [phone]  Notification Type Page  Notification Reason Other (Comment);Change in status (Increased HR)  Provider response See new orders  Date of Provider Response 11/30/21  Time of Provider Response 0201  Document  Patient Outcome Stabilized after interventions

## 2021-11-30 NOTE — Plan of Care (Signed)

## 2021-11-30 NOTE — Progress Notes (Signed)
Neomia Glass, NP at bedside.  Will continue to monitor

## 2021-11-30 NOTE — Progress Notes (Signed)
Sudden HR increased to 160s.  Patient denied chest discomfort; however, she is diaphoretic.On call NP, Neomia Glass, notified and an order for EKG and metoprolol received.

## 2021-11-30 NOTE — Progress Notes (Signed)
PROGRESS NOTE        PATIENT DETAILS Name: ANJENETTE GERBINO Age: 55 y.o. Sex: female Date of Birth: 02/23/1966 Admit Date: 11/20/2021 Admitting Physician Orma Flaming, MD JOI:NOMVEH, Charlane Ferretti, MD  Brief Narrative: Patient is a 55 y.o. female who was recently diagnosed with lung CA-with right cerebellar metastatic lesion-s/p craniotomy and tumor resection on 12/14-presented to the hospital on 12/27 with right arm weakness, fatigue, hemoptysis.  Upon further evaluation-she was found to have more's brain mets, bilateral pleural effusion, hypokalemia and severe anemia.  She was subsequently admitted to the hospitalist service.  Subjective: Pain in her chest when she takes deep breaths and coughs-much more lethargic-seems to be deteriorating rapidly.  Very hard of hearing.  Objective: Vitals: Blood pressure 124/81, pulse 82, temperature 98.1 F (36.7 C), temperature source Oral, resp. rate 20, last menstrual period 03/29/2011, SpO2 96 %.   Exam: Gen Exam: Lethargic-some pleuritic chest pain-but otherwise comfortable. HEENT:atraumatic, normocephalic Chest: B/L clear to auscultation anteriorly CVS:S1S2 regular Abdomen:soft non tender, non distended Extremities:no edema Neurology: Non focal Skin: no rash   Pertinent Labs/Radiology: Recent Labs  Lab 11/27/2021 1118 11/29/21 0424 11/30/21 0224  WBC 12.6*   < > 18.2*  HGB 5.7*   < > 10.5*  PLT 263   < > 156  NA 131*   < > 128*  K 2.6*   < > 3.4*  CREATININE 0.50   < > 0.54  AST 18  --   --   ALT 16  --   --   ALKPHOS 77  --   --   BILITOT 0.7  --   --    < > = values in this interval not displayed.     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  Appreciate oncology/neurosurgery and palliative care team input-Long discussion with daughter and patient's mother at bedside this morning.  Plans are to transition to comfort care-and see if patient can qualify for residential hospice bed.  She has rapidly deteriorated in a matter of few days-suspect has a very limited life expectancy of less than 2 weeks.  Hemoptysis/pleural effusion: Due to underlying malignancy-supportive care for now.    S/p right-sided craniotomy and right cerebellar tumor resection on 12/14: Did have some mild right arm weakness on presentation that has since resolved.    Hypokalemia: Repleted-no plans to recheck given she is now comfort care.  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  Normocytic anemia: No history of melena/hematochezia-doubt active GI bleeding-FOBT positive but has been having intermittent hemoptysis.  Suspect anemia is from underlying malignancy.  Hemoglobin stable after 3 units of PRBC transfusion.  No plans to follow as she is comfort care.  Minimally elevated troponin: Doubt any clinical significance.  Not a candidate for further work-up given advanced malignancy in any event.  COPD with chronic hypoxic respiratory failure: On 4 L of oxygen at baseline.  Recent PNA: Was already on cefdinir prior to this hospitalization-has been discontinued as she is comfort care.  PAF with RVR: Briefly in RVR this morning-given IV Lopressor-oral beta-blocker dosage has been increased.  Continue beta-blocker for comfort.  Does  not need telemetry monitoring.  Thyroid nodule: Incidental finding on CT-doubt any utility for further work-up.  Tobacco abuse: Counseled-declined nicotine patch.    Goals of care/palliative care discussion: DNR in place-rapidly deteriorating-now comfort care after oncology/neurosurgery and palliative care  evaluation.   BMI Estimated body mass index is 20.47 kg/m as calculated from the following:   Height as of 11/13/21: 5\' 4"  (1.626 m).   Weight as of 11/13/21: 54.1 kg.    Procedures: None Consults: Neurosurgery, palliative care, oncology DVT Prophylaxis: SCDs Code Status:DNR Family Communication: Daughter and mother at bedside  Time spent: 25 minutes-Greater than 50% of this time was spent in counseling, explanation of diagnosis, planning of further management, and coordination of care.   Disposition Plan: Status is: Inpatient  Remains inpatient appropriate because: Worsening cancer burden-with symptomatic anemia/multiple brain mets/hemoptysis-not yet stable for discharge.  Awaiting goals of care conversation        Diet: Diet Order             Diet clear liquid Room service appropriate? Yes with Assist; Fluid consistency: Thin  Diet effective now                     Antimicrobial agents: Anti-infectives (From admission, onward)    Start     Dose/Rate Route Frequency Ordered Stop   11/15/2021 2200  cefdinir (OMNICEF) capsule 300 mg        300 mg Oral Every 12 hours 11/12/2021 1846 11/29/21 2222        MEDICATIONS: Scheduled Meds:  arformoterol  15 mcg Nebulization BID   budesonide (PULMICORT) nebulizer solution  0.25 mg Nebulization BID   metoprolol tartrate  25 mg Oral Q8H   morphine CONCENTRATE  2.6 mg Sublingual Q6H   pantoprazole (PROTONIX) IV  40 mg Intravenous Q12H   sodium chloride  1 g Oral BID WC   Continuous Infusions:   PRN Meds:.acetaminophen **OR** acetaminophen, albuterol, antiseptic oral rinse, glycopyrrolate **OR** glycopyrrolate **OR** glycopyrrolate, guaiFENesin-dextromethorphan, haloperidol **OR** haloperidol **OR** haloperidol lactate, LORazepam **OR** LORazepam **OR** LORazepam, morphine injection, ondansetron (ZOFRAN) IV, polyvinyl alcohol   I have personally reviewed following labs and imaging studies  LABORATORY  DATA: CBC: Recent Labs  Lab 11/08/2021 1118 11/29/21 0424 11/30/21 0224  WBC 12.6* 18.1* 18.2*  NEUTROABS 10.9*  --   --   HGB 5.7* 11.3* 10.5*  HCT 17.8* 33.8* 30.8*  MCV 88.1 85.8 83.7  PLT 263 186 156     Basic Metabolic Panel: Recent Labs  Lab 11/10/2021 1118 11/10/2021 1245 11/29/21 0424 11/30/21 0224  NA 131*  --  133* 128*  K 2.6*  --  3.4* 3.4*  CL 92*  --  95* 93*  CO2 28  --  23 19*  GLUCOSE 112*  --  104* 132*  BUN <5*  --  5* 6  CREATININE 0.50  --  0.50 0.54  CALCIUM 8.0*  --  8.0* 8.0*  MG  --  1.8  --  1.8  PHOS  --  2.7  --   --      GFR: CrCl cannot be calculated (Unknown ideal weight.).  Liver Function Tests: Recent Labs  Lab 11/26/2021 1118  AST 18  ALT 16  ALKPHOS 77  BILITOT 0.7  PROT 5.5*  ALBUMIN 2.2*    Recent Labs  Lab 11/12/2021 1118  LIPASE 89*    Recent Labs  Lab 12/02/2021 1245  AMMONIA 18     Coagulation Profile: Recent Labs  Lab 11/04/2021  1118  INR 1.1     Cardiac Enzymes: No results for input(s): CKTOTAL, CKMB, CKMBINDEX, TROPONINI in the last 168 hours.  BNP (last 3 results) No results for input(s): PROBNP in the last 8760 hours.  Lipid Profile: No results for input(s): CHOL, HDL, LDLCALC, TRIG, CHOLHDL, LDLDIRECT in the last 72 hours.  Thyroid Function Tests: No results for input(s): TSH, T4TOTAL, FREET4, T3FREE, THYROIDAB in the last 72 hours.  Anemia Panel: No results for input(s): VITAMINB12, FOLATE, FERRITIN, TIBC, IRON, RETICCTPCT in the last 72 hours.  Urine analysis:    Component Value Date/Time   COLORURINE YELLOW 11/19/2021 1827   APPEARANCEUR CLEAR 11/15/2021 1827   LABSPEC <1.005 (L) 11/29/2021 1827   PHURINE 5.5 11/13/2021 1827   GLUCOSEU NEGATIVE 11/09/2021 1827   HGBUR MODERATE (A) 11/10/2021 1827   BILIRUBINUR NEGATIVE 11/15/2021 1827   KETONESUR 40 (A) 11/30/2021 1827   PROTEINUR NEGATIVE 11/21/2021 1827   NITRITE NEGATIVE 11/19/2021 1827   LEUKOCYTESUR NEGATIVE 11/20/2021 1827     Sepsis Labs: Lactic Acid, Venous    Component Value Date/Time   LATICACIDVEN 1.1 11/18/2021 1245    MICROBIOLOGY: Recent Results (from the past 240 hour(s))  Resp Panel by RT-PCR (Flu A&B, Covid) Nasopharyngeal Swab     Status: None   Collection Time: 11/21/2021 12:21 PM   Specimen: Nasopharyngeal Swab; Nasopharyngeal(NP) swabs in vial transport medium  Result Value Ref Range Status   SARS Coronavirus 2 by RT PCR NEGATIVE NEGATIVE Final    Comment: (NOTE) SARS-CoV-2 target nucleic acids are NOT DETECTED.  The SARS-CoV-2 RNA is generally detectable in upper respiratory specimens during the acute phase of infection. The lowest concentration of SARS-CoV-2 viral copies this assay can detect is 138 copies/mL. A negative result does not preclude SARS-Cov-2 infection and should not be used as the sole basis for treatment or other patient management decisions. A negative result may occur with  improper specimen collection/handling, submission of specimen other than nasopharyngeal swab, presence of viral mutation(s) within the areas targeted by this assay, and inadequate number of viral copies(<138 copies/mL). A negative result must be combined with clinical observations, patient history, and epidemiological information. The expected result is Negative.  Fact Sheet for Patients:  EntrepreneurPulse.com.au  Fact Sheet for Healthcare Providers:  IncredibleEmployment.be  This test is no t yet approved or cleared by the [place] FDA and  has been authorized for detection and/or diagnosis of SARS-CoV-2 by FDA under an Emergency Use Authorization (EUA). This EUA will remain  in effect (meaning this test can be used) for the duration of the COVID-19 declaration under Section 564(b)(1) of the Act, 21 U.S.C.section 360bbb-3(b)(1), unless the authorization is terminated  or revoked sooner.       Influenza A by PCR NEGATIVE NEGATIVE Final    Influenza B by PCR NEGATIVE NEGATIVE Final    Comment: (NOTE) The Xpert Xpress SARS-CoV-2/FLU/RSV plus assay is intended as an aid in the diagnosis of influenza from Nasopharyngeal swab specimens and should not be used as a sole basis for treatment. Nasal washings and aspirates are unacceptable for Xpert Xpress SARS-CoV-2/FLU/RSV testing.  Fact Sheet for Patients: EntrepreneurPulse.com.au  Fact Sheet for Healthcare Providers: IncredibleEmployment.be  This test is not yet approved or cleared by the [place] FDA and has been authorized for detection and/or diagnosis of SARS-CoV-2 by FDA under an Emergency Use Authorization (EUA). This EUA will remain in effect (meaning this test can be used) for the duration of the COVID-19 declaration under Section 564(b)(1) of the  Act, 21 U.S.C. section 360bbb-3(b)(1), unless the authorization is terminated or revoked.  Performed at [facility], [place] [address] [place], [place] [address]   Culture, blood (routine x 2)     Status: None (Preliminary result)   Collection Time: 12/02/2021 12:45 PM   Specimen: BLOOD  Result Value Ref Range Status   Specimen Description BLOOD RIGHT ANTECUBITAL  Final   Special Requests   Final    BOTTLES DRAWN AEROBIC AND ANAEROBIC Blood Culture results may not be optimal due to an inadequate volume of blood received in culture bottles   Culture   Final    NO GROWTH 2 DAYS Performed at [facility], [place] [address], [place]  [address]    Report Status PENDING  Incomplete  Culture, blood (routine x 2)     Status: None (Preliminary result)   Collection Time: 11/05/2021 12:49 PM   Specimen: BLOOD  Result Value Ref Range Status   Specimen Description BLOOD LEFT ANTECUBITAL  Final   Special Requests   Final    BOTTLES DRAWN AEROBIC AND ANAEROBIC Blood Culture adequate volume   Culture   Final    NO GROWTH 2 DAYS Performed at [facility], [place]  [address] [place],  [address]    Report Status PENDING  Incomplete    RADIOLOGY STUDIES/RESULTS: CT HEAD WO CONTRAST (5MM)  Result Date: 11/25/2021 CLINICAL DATA:  Short of breath, dizziness, CNS neoplasm, lung cancer EXAM: CT HEAD WITHOUT CONTRAST TECHNIQUE: Contiguous axial images were obtained from the base of the skull through the vertex without intravenous contrast. COMPARISON:  11/16/2021, 11/14/2021 FINDINGS: Brain: Postsurgical changes from right occipital craniotomy with resection of the underlying hemorrhagic metastatic lesion seen previously. There is a small amount of residual blood products within the surgical bed. Continued edema and mass effect, with slight effacement of the fourth ventricle and right basilar cisterns. Since the prior exam, greater than 10 hemorrhagic metastatic lesions have developed throughout the brain parenchyma. The largest within the left frontal lobe measures 1.3 x 1.7 x 1.5 cm reference image 18/3. Mild surrounding vasogenic edema. There is no significant midline shift or mass effect however. The lateral ventricles are unremarkable. There are no acute extra-axial fluid collections. Vascular: No hyperdense vessel or unexpected calcification. Skull: Postsurgical changes from right occipital craniotomy. No acute or destructive bony lesions. Sinuses/Orbits: No acute finding. Other: None. IMPRESSION: 1. Interval progression of intracranial metastases, with multiple new hemorrhagic metastatic lesions throughout the bilateral cerebral hemispheres and cerebellum. Largest metastatic lesion in the left frontal lobe as above. 2. Postsurgical changes from right occipital hemorrhagic metastatic deposit resection, with minimal residual blood products and continued mass effect. Critical Value/emergent results were called by telephone at the time of interpretation on 11/17/2021 at 7:21 pm to provider [facility] , who verbally acknowledged these results. Electronically Signed   By:  Randa Ngo M.D.   On: 11/27/2021 19:28     LOS: 2 days   Oren Binet, MD  [facility]    To contact the attending provider between 7A-7P or the covering provider during after hours 7P-7A, please log into the web site [email or web address] and access using universal [place] password for that web site. If you do not have the password, please call the hospital operator.  11/30/2021, 2:14 PM

## 2021-11-30 NOTE — Progress Notes (Signed)
Patient and family refusing insertion of urethral catheter at this time. Pt feels this would be more uncomfortable then getting up to go to the restroom. Pt instructed to let staff know if she changes her mind.

## 2021-11-30 NOTE — Progress Notes (Signed)
MRI pending, CTH reviewed, unfortunately with multiple new hemorrhagic metastases, really unfortunate given how quickly they've popped up since her last scan just 14 days ago. Will need to consider WBRT, looks like hospice eval already pending, which I think is reasonable. Will review the MRI when completed and re-evaluate accordingly.

## 2021-11-30 NOTE — Progress Notes (Signed)
Daily Progress Note   Patient Name: Tanya Jordan       Date: 11/30/2021 DOB: 19-Apr-1966  Age: 55 y.o. MRN#: [id number] Attending Physician: Jonetta Osgood, MD Primary Care Physician: Charlott Rakes, MD Admit Date: 11/27/2021  Reason for Consultation: Establishing goals of care, "stage IV lung cancer"   HPI/Patient Profile: 55 y.o. female  with past medical history of COPD, alcohol abuse, allergies, HTN, metastatic lung cancer with chronic respiratory failure on 4L oxygen presented to ED on 11/15/2021 from home with complaints of shortness of breath and dizziness. Of note, patient was recently hospitalized from 12/12-12/21/22 for ICH - during this hospitalization on 11/15/21 she underwent craniotomy and tumor resection with plan to follow up/start outpatient radiation in January 2023. Patient was admitted on 11/10/2021 with symptomatic anemia, metastatic primary lung cancer, ICH s/p craniotomy and tumor resection, hypokalemia, hyponatremia, elevated troponin, thyroid nodule.    Subjective: I met at bedside with patient, [name], her son, and her mother. Patient is somewhat lethargic and not really able to participate in [place] discussion. During my visit, I did assist patient with getting to the [facility] and she became very dyspneic. Her breathing appears labored even at rest.   I introduced Palliative Medicine as specialized medical care for people living with serious illness. It focuses on providing relief from the symptoms and stress of a serious illness.   We discussed patient's current illness and what it means in the larger context of her ongoing co-morbidities. I provided education on the natural disease trajectory of advanced cancer, emphasizing that functional status is  generally preserved until late in the disease course, followed by a precipitous decline over weeks to months. Discussed that the onset of decline usually suggests worsening disease.   The difference between full scope medical intervention and comfort care was considered.  I reviewed the concept of a comfort path to family, emphasizing that this path involves de-escalating and stopping full scope medical interventions, allowing a natural course to occur. Discussed that the goal is comfort and dignity rather than cure/prolonging life.  Introduced hospice philosophy and provided information on home vs residential hospice services - answered all questions.   Family agrees that the goal is comfort at this point. Discussed transitioning to comfort care while in the hospital, and what this would  look like--keeping her clean and dry, no labs, no artificial hydration or feeding, no antibiotics, minimizing of medications, medication for pain and dyspnea. Family requests referral for residential hospice facility in [facility] - discussed that patient appears to be declining rapidly and would likely be eligible.   Questions and concerns were addressed.  The family was encouraged to call with questions or concerns.   Care plan was discussed with Dr. Sloan Leiter and [facility] team.    Length of Stay: 2  Current Medications: Scheduled Meds:   arformoterol  15 mcg Nebulization BID   budesonide (PULMICORT) nebulizer solution  0.25 mg Nebulization BID   metoprolol tartrate  25 mg Oral Q8H   morphine CONCENTRATE  2.6 mg Sublingual Q6H     PRN Meds: acetaminophen **OR** acetaminophen, albuterol, antiseptic oral rinse, glycopyrrolate **OR** glycopyrrolate **OR** glycopyrrolate, guaiFENesin-dextromethorphan, haloperidol **OR** haloperidol **OR** haloperidol lactate, LORazepam **OR** LORazepam **OR** LORazepam, morphine injection, ondansetron (ZOFRAN) IV, polyvinyl alcohol  Physical Exam Vitals reviewed.  Constitutional:       General: She is not in acute distress.    Appearance: She is ill-appearing.  Pulmonary:     Effort: Accessory muscle usage present.     Comments: Labored breathing Neurological:     Mental Status: She is lethargic.            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 and pancreatic/adrenal metastases - hemoptysis/pleural effusion - Normocytic anemia - hypokalemia - hyponatremia  Recommendations/Plan: Full comfort measures initiated Transfer to hospice facility in [facility] when bed becomes available - TOC consult placed; TOC and hospice liaison notifed Added orders for symptom management at EOL as well as discontinued orders that were not focused on comfort PRN medications are available for symptom management at EOL PMT will continue to follow  Symptom Management:  Scheduled Morphine CONCENTRATE solution 2.5 mg SL every 6 hours Morphine 1-2 mg IV every 2 hours prn for breakthrough pain or dyspnea Lorazepam (ATIVAN) prn for anxiety Haloperidol (HALDOL) prn for agitation  Glycopyrrolate (ROBINUL) for excessive secretions Ondansetron (ZOFRAN) prn for nausea Polyvinyl alcohol (LIQUIFILM TEARS) prn for dry eyes Antiseptic oral rinse (BIOTENE) prn for dry mouth  Goals of Care and Additional Recommendations: Limitations on Scope of Treatment: Full Comfort Care  Code Status: DNR/DNI   Prognosis:  < 2 weeks  Discharge Planning: Hospice facility   Thank you for allowing the Palliative Medicine Team to  assist in the care of this patient.   Total Time 65 minutes Prolonged Time Billed  yes       Greater than 50%  of this time was spent counseling and coordinating care related to the above assessment and plan.  Lavena Bullion, NP  Please contact Palliative Medicine Team phone at [phone] for questions and concerns.

## 2021-11-30 NOTE — Progress Notes (Signed)
OT Cancellation Note  Patient Details Name: Tanya Jordan MRN: [id number] DOB: Jun 26, 1966   Cancelled Treatment:    Reason Eval/Treat Not Completed: Patient not medically ready (Currently in afib with HR 135 at rest. Noted Palliative Consult pending with Oncology recommending Hospice. Will defer evaluation at this time and proceed when medically appropriate.)  Jefferey Pica, OTR/L Acute Rehabilitation Services Pager: [phone] Office: [phone]   Denine Brotz C 11/30/2021, 9:32 AM

## 2021-11-30 NOTE — TOC Initial Note (Signed)
Transition of Care [facility]) - Initial/Assessment Note    Patient Details  Name: Tanya Jordan MRN: [id number] Date of Birth: [birth year]  Transition of Care [facility]) CM/SW Contact:    Benard Halsted, LCSW Phone Number: 11/30/2021, 2:09 PM  Clinical Narrative:                 CSW received consult for residential hospice placement. Daughter requests [facility] in [place]. CSW sent referral for review though they do not currently have a bed available.   Expected Discharge Plan: [place] Barriers to Discharge: Hospice Bed not available   Patient Goals and CMS Choice Patient states their goals for this hospitalization and ongoing recovery are:: Be comfortable      Expected Discharge Plan and Services Expected Discharge Plan: [place] In-house Referral: Clinical Social Work   Post Acute Care Choice: Hospice Living arrangements for the past 2 months: [place]                                      Prior Living Arrangements/Services Living arrangements for the past 2 months: Hospice Facility            Need for Family Participation in Patient Care: Yes (Comment) Care giver support system in place?: Yes (comment)      Activities of Daily Living      Permission Sought/Granted Permission sought to share information with : Facility Sport and exercise psychologist, Family Supports Permission granted to share information with : Yes, Verbal Permission Granted  Share Information with NAME: Olivia Mackie  Permission granted to share info w AGENCY: Hospice  Permission granted to share info w Relationship: Daughter  Permission granted to share info w Contact Information: [phone]  Emotional Assessment Appearance:: Appears stated age Attitude/Demeanor/Rapport: Engaged Affect (typically observed): Accepting, Appropriate Orientation: : Oriented to Self, Oriented to Place, Oriented to  Time, Oriented to Situation Alcohol / Substance  Use: Not Applicable Psych Involvement: No (comment)  Admission diagnosis:  Hypokalemia [E87.6] Generalized weakness [R53.1] Primary malignant neoplasm of lung metastatic to other site, unspecified laterality (HCC) [C34.90] Symptomatic anemia [D64.9] Patient Active Problem List   Diagnosis Date Noted   Symptomatic anemia 11/24/2021   Metastatic primary lung cancer ([place]) 11/06/2021   Hypokalemia 11/18/2021   Elevated troponin 11/17/2021   Hyponatremia 11/09/2021   Chronic respiratory failure ([place]) 11/18/2021   Dysuria 12/02/2021   History of pneumonia 11/12/2021   AF (paroxysmal atrial fibrillation) ([place]) 11/26/2021   Thyroid nodule 11/19/2021   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 [place] [address] Phone: [phone] Fax: [phone]  [id number] [place], [place] [address] [place] [address] Phone: [phone] Fax: [phone]     Social Determinants of Health (SDOH) Interventions    Readmission Risk Interventions No flowsheet data found.

## 2021-11-30 NOTE — Progress Notes (Signed)
Oncology Nurse Navigator Documentation  Oncology Nurse Navigator Flowsheets 11/30/2021 11/29/2021  Abnormal Finding Date - 11/13/2021  Confirmed Diagnosis Date - 11/15/2021  Diagnosis Status - Pending Molecular Studies  Planned Course of Treatment - Radiation;Surgery;Targeted Therapy;Chemotherapy  Phase of Treatment - Surgery  Radiation Actual Start Date: - 12/05/2021  Surgery Actual Start Date: - 11/15/2021  Navigator Follow Up Date: - 12/12/2021  Navigator Follow Up Reason: - New Patient Appointment  Navigator Location [facility]  Navigator Encounter Type Other: Other:  Treatment Initiated Date - 11/15/2021  Patient Visit Type Other Other  Treatment Phase - Other  Barriers/Navigation Needs Coordination of Care/I followed up on Ms. Dworkin's notes.  Per Dr. Alen Blew, patient will be transitioning to hospice care.  I will update Dr. Julien Nordmann and Dr. Lisbeth Renshaw.  Coordination of Care  Interventions Coordination of Care Coordination of Care  Acuity - Level 3-Moderate Needs (3-4 Barriers Identified)  Coordination of Care - Pathology  Time Spent with Patient 30 45

## 2021-11-30 NOTE — Progress Notes (Addendum)
° °                                         CROSS COVER NOTE  NAME: Tanya Jordan MRN: [id number] DOB : [birth year]  Page received from RN reporting heart rate in the150s, reportedly A. fib with RVR according to tele staff.  Upon nurse review of telemetry nurse report it appears to be sinus tach.  EKG ordered and EKG reflects A. fib with RVR HR 146, blood pressure 129/85.  Initially, nurse reported patient was asymptomatic.  Upon follow-up 20 minutes later nurse reporting patient is diaphoretic, denies chest pain, denies shortness of breath.  Patient is tachypneic with respiratory rate of 26, recent CT angio negative for pulmonary embolism.  EKG does not reflect ischemic changes.  On admission patient reported hemoptysis, hemoglobin of 5.7 in ED requiring transfusion. Hemoglobin was 11.8 morning of 12/28.    5 mg IV metoprolol ordered Requested RN ask lab to draw a.m. labs early for review of hemoglobin, potassium, and magnesium.  On my bedside evaluation patient is not diaphoretic, denies chest pain, shortness of breath, denies dizziness/lightheadedness. On auscultation heart rate is irregular and in the 120s after administration of IV metoprolol. AFIB on the monitor. K is 3.4 (40 mEQ PO and 20 mEq IV K ordered) and Mg is 1.8 (1gm ordered). Patient endorses continued hemoptysis that she reports happens 2-3 times daily but is unable to quantify the amount. Hemoglobin 10.5 this morning down from 11.3 yesterday.    Neomia Glass MHA, MSN, FNP-BC Nurse Practitioner [facility]

## 2021-12-01 ENCOUNTER — Ambulatory Visit: Payer: 59 | Admitting: Radiation Oncology

## 2021-12-01 DIAGNOSIS — J9610 Chronic respiratory failure, unspecified whether with hypoxia or hypercapnia: Secondary | ICD-10-CM | POA: Diagnosis not present

## 2021-12-01 DIAGNOSIS — I10 Essential (primary) hypertension: Secondary | ICD-10-CM

## 2021-12-01 DIAGNOSIS — I480 Paroxysmal atrial fibrillation: Secondary | ICD-10-CM | POA: Diagnosis not present

## 2021-12-01 DIAGNOSIS — D649 Anemia, unspecified: Secondary | ICD-10-CM | POA: Diagnosis not present

## 2021-12-01 MED ORDER — MORPHINE SULFATE (CONCENTRATE) 10 MG/0.5ML PO SOLN: 2.5000 mg | Freq: Four times a day (QID) | ORAL | 0 refills | Status: AC

## 2021-12-01 MED ORDER — LORAZEPAM 2 MG/ML PO CONC: 1.0000 mg | ORAL | 0 refills | Status: AC | PRN

## 2021-12-01 MED ORDER — MORPHINE SULFATE (PF) 2 MG/ML IV SOLN
1.0000 mg | INTRAVENOUS | 0 refills | Status: AC | PRN
Start: 2021-12-01 — End: ?

## 2021-12-03 LAB — CULTURE, BLOOD (ROUTINE X 2)
Culture: NO GROWTH
Culture: NO GROWTH
Special Requests: ADEQUATE

## 2021-12-03 NOTE — Progress Notes (Signed)
°   2021/12/31 1315  Clinical Encounter Type  Visited With Patient and family together  Visit Type Initial;Spiritual support;Death  Referral From Nurse  Consult/Referral To Chaplain    Chaplain Jorene Guest responded to the page to provide support to the family. Ike Bene provided grief support to daughters Herbert Spires and Leveda Anna. Providing a listening presence as the family engaged in storytelling. Ike Bene read from the sacred text and prayed with the family. Tori requested hand prints of the patient. Ike Bene provided the unit with the ink kit and assisted the NT with creating five prints. The family expressed appreciation for the chaplain's care. The daughter, Carletha Dawn will be the contact person. Chaplain gave her the Patient Placement card. Traci's [phone]. Address [address] [place], [place] [address]. The funeral home selected is [employer] [phone], [address] [place], [place], [place] [address]

## 2021-12-03 NOTE — Discharge Summary (Signed)
PATIENT DETAILS Name: Tanya Jordan Age: 56 y.o. Sex: female Date of Birth: 10-27-1966 MRN: [id number]. Admitting Physician: Orma Flaming, MD GDJ:[id number], Charlane Ferretti, MD  Admit Date: 11/27/2021 Discharge date: 12/08/2021  Recommendations for Outpatient Follow-up:  Optimize comfort measures.  Admitted From:  Home  Disposition: [place]: No  Equipment/Devices: None  Discharge Condition: Poor/hospice  CODE STATUS: DNR  Diet recommendation:  Diet Order             Diet - low sodium heart healthy           Diet clear liquid Room service appropriate? Yes with Assist; Fluid consistency: Thin  Diet effective now                    Brief Summary: [name] who was recently diagnosed with lung CA-with right cerebellar metastatic lesion-s/p craniotomy and tumor resection on 12/14-presented to the hospital on 12/27 with right arm weakness, fatigue, hemoptysis.  Upon further evaluation-she was found to have more's brain mets, bilateral pleural effusion, hypokalemia and severe anemia.  She was subsequently admitted to the hospitalist service.  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  Appreciate oncology/neurosurgery and palliative care team input-Long discussion with daughter and patient's mother at bedside this morning.  Has been transitioned to full comfort measures.   Hemoptysis/pleural effusion: Due to underlying malignancy-supportive care for now.     S/p right-sided craniotomy and right cerebellar tumor resection on 12/14: Did have some mild right arm weakness on presentation that has since  resolved.     Hypokalemia: Repleted-no plans to recheck given she is now comfort care.   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   Normocytic anemia: No history of melena/hematochezia-doubt active GI bleeding-FOBT positive but has been having intermittent hemoptysis.  Suspect anemia is from underlying malignancy.  Hemoglobin stable after 3 units of PRBC transfusion.  No plans to follow as she is comfort care.   Minimally elevated troponin: Doubt any clinical significance.  Not a candidate for further work-up given advanced malignancy in any event.   COPD with chronic hypoxic respiratory failure: On 4 L of oxygen at baseline.   Recent PNA: Was already on cefdinir prior to this hospitalization-has been discontinued as she is comfort care.   PAF with RVR: Controlled with Lopressor-does not need any rate control agents-rapidly declining and on comfort measures.   Thyroid nodule: Incidental finding on CT-doubt any utility for further work-up.   Tobacco abuse: Counseled-declined nicotine patch.     Goals of care/palliative care discussion: DNR in place-rapidly deteriorating-now comfort care after oncology/neurosurgery and palliative care evaluation.    BMI Estimated body mass index is 20.47 kg/m as calculated from the following:   Height as of 11/13/21: 5\' 4"  (1.626 m).   Weight as of 11/13/21: 54.1 kg.    Procedures None  Discharge Diagnoses:  Principal Problem:   Symptomatic anemia Active Problems:   Tobacco abuse   COPD with chronic bronchitis (HCC)   Essential hypertension   Intracranial hemorrhage (HCC)   Metastatic primary lung cancer (HCC)   Hypokalemia   Elevated troponin   Hyponatremia   Chronic respiratory failure (HCC)   Dysuria   History of  pneumonia   AF (paroxysmal atrial fibrillation) (HCC)   Thyroid nodule   Discharge Instructions:  Activity:  As tolerated with Full fall precautions use walker/cane & assistance as  needed  Discharge Instructions     Call MD for:  difficulty breathing, headache or visual disturbances   Complete by: As directed    Diet - low sodium heart healthy   Complete by: As directed    Increase activity slowly   Complete by: As directed       Allergies as of 2021-12-11       Reactions   Lisinopril Swelling        Medication List     STOP taking these medications    acetaminophen 325 MG tablet Commonly known as: TYLENOL   albuterol 108 (90 Base) MCG/ACT inhaler Commonly known as: VENTOLIN HFA   Breo Ellipta 200-25 MCG/ACT Aepb Generic drug: fluticasone furoate-vilanterol   cefdinir 300 MG capsule Commonly known as: OMNICEF   cetirizine 10 MG tablet Commonly known as: ZYRTEC   Incruse Ellipta 62.5 MCG/ACT Aepb Generic drug: umeclidinium bromide   meclizine 12.5 MG tablet Commonly known as: ANTIVERT   metoprolol tartrate 25 MG tablet Commonly known as: LOPRESSOR   sodium chloride 1 g tablet   Symbicort 160-4.5 MCG/ACT inhaler Generic drug: budesonide-formoterol       TAKE these medications    LORazepam 2 MG/ML concentrated solution Commonly known as: ATIVAN Place 0.5 mLs (1 mg total) under the tongue every 4 (four) hours as needed for anxiety.   morphine CONCENTRATE 10 MG/0.5ML Soln concentrated solution Place 0.13 mLs (2.6 mg total) under the tongue every 6 (six) hours.   morphine 2 MG/ML injection Inject 0.5-1 mLs (1-2 mg total) into the vein every 2 (two) hours as needed (breakthrough pain or dyspnea).        Follow-up Information     Charlott Rakes, MD Follow up.   Specialty: Family Medicine Why: If you dont hear from them,please give them a call Contact information: [address] [place] [address] [phone]                Allergies  Allergen Reactions   Lisinopril Swelling      Consultations: Oncology, neurosurgery, palliative care   Other Procedures/Studies: DG Chest 2 View  Result  Date: 11/20/2021 CLINICAL DATA:  Shortness of breath EXAM: CHEST - 2 VIEW COMPARISON:  11/20/2021 radiographs and 11/13/2021 CT chest FINDINGS: Unchanged cardiac and mediastinal contours. Nodular opacity in the right upper lobe, which correlates with the known right upper lobe mass as well as likely the associated interlobular septal thickening seen on the prior CT, which has increased since the prior exam. Increased fullness in the right hilum. Trace right pleural effusion. Small left pleural effusion.  The left lung is otherwise clear. No acute osseous abnormality IMPRESSION: 1. Redemonstrated nodular opacity in the right upper lobe, correlating with the mass seen on the prior CT, with interval increase in surrounding opacities, likely related to the previously noted interlobular septal thickening. 2. Small left and trace right pleural effusions. 3. Increased right hilar fullness, likely worsening lymphadenopathy. Electronically Signed   By: Merilyn Baba M.D.   On: 11/16/2021 11:50   DG Chest 2 View  Result Date: 11/13/2021 CLINICAL DATA:  Vomiting.  Dizziness.  Sinus infection. EXAM: CHEST - 2 VIEW COMPARISON:  01/14/2016 FINDINGS: The heart size and mediastinal contours are within normal limits. 2.8 cm nodular opacity is seen in the right upper lobe. Another 1.5  cm nodular opacity is seen in the right lung base. New pulmonary infiltrate is seen in the inferior right upper lobe. No evidence of pleural effusion. Mild asymmetric soft tissue prominence is seen in the right hilum, suspicious for hilar lymphadenopathy. : Two right lung nodules and possible right hilar lymphadenopathy, raising suspicion for malignancy. Chest CT with contrast is recommended for further evaluation. New pulmonary infiltrate in inferior right upper lobe. Electronically Signed   By: Marlaine Hind M.D.   On: 11/13/2021 12:34   DG Lumbar Spine Complete  Result Date: 11/13/2021 CLINICAL DATA:  Back pain, possible malignancy on chest  radiograph EXAM: LUMBAR SPINE - COMPLETE 4+ VIEW COMPARISON:  None. FINDINGS: There are 5 lumbar vertebral bodies. No pars break. Vertebral body heights and alignment are maintained. 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 Disc space narrowing is present and greatest at L5-S1. Facet hypertrophy is greatest at lower lumbar levels. IMPRESSION: No compression fracture or large lesion. There is limited evaluation for malignancy by radiograph. 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 Electronically Signed   By: Macy Mis M.D.   On: 11/13/2021 13:31   CT HEAD WO CONTRAST (5MM)  Result Date: 11/08/2021 CLINICAL DATA:  Short of breath, dizziness, CNS neoplasm, lung cancer EXAM: CT HEAD WITHOUT CONTRAST TECHNIQUE: Contiguous axial images were obtained from the base of the skull through the vertex without intravenous contrast. COMPARISON:  11/16/2021, 11/14/2021 FINDINGS: Brain: Postsurgical changes from right occipital craniotomy with resection of the underlying hemorrhagic metastatic lesion seen previously. There is a small amount of residual blood products within the surgical bed. Continued edema and mass effect, with slight effacement of the fourth ventricle and right basilar cisterns. Since the prior exam, greater than 10 hemorrhagic metastatic lesions have developed throughout the brain parenchyma. The largest within the left frontal lobe measures 1.3 x 1.7 x 1.5 cm reference image 18/3. Mild surrounding vasogenic edema. There is no significant midline shift or mass effect however. The lateral ventricles are unremarkable. There are no acute extra-axial fluid collections. Vascular: No hyperdense vessel or unexpected calcification. Skull: Postsurgical changes from right occipital craniotomy. No acute or destructive bony lesions. Sinuses/Orbits: No acute finding. Other: None. IMPRESSION: 1. Interval progression of intracranial metastases, with multiple new hemorrhagic metastatic lesions  throughout the bilateral cerebral hemispheres and cerebellum. Largest metastatic lesion in the left frontal lobe as above. 2. Postsurgical changes from right occipital hemorrhagic metastatic deposit resection, with minimal residual blood products and continued mass effect. Critical Value/emergent results were called by telephone at the time of interpretation on 11/12/2021 at 7:21 pm to provider [facility] , who verbally acknowledged these results. Electronically Signed   By: Randa Ngo M.D.   On: 11/21/2021 19:28   CT Head Wo Contrast  Result Date: 11/13/2021 CLINICAL DATA:  Weakness. EXAM: CT HEAD WITHOUT CONTRAST TECHNIQUE: Contiguous axial images were obtained from the base of the skull through the vertex without intravenous contrast. COMPARISON:  None. FINDINGS: Brain: 3 x 2 cm intraparenchymal hemorrhage is noted in the right cerebellar hemisphere. Ventricular size is within normal limits. No midline shift is noted. Vascular: No hyperdense vessel or unexpected calcification. Skull: Normal. Negative for fracture or focal lesion. Sinuses/Orbits: No acute finding. Other: None. IMPRESSION: 3 x 2 cm intraparenchymal hemorrhage is noted in right cerebellar hemisphere. Critical Value/emergent results were called by telephone at the time of interpretation on 11/13/2021 at 3:59 pm to provider Theodis Blaze , who verbally acknowledged these results. Electronically Signed   By: Marijo Conception  M.D.   On: 11/13/2021 15:59   CT Chest W Contrast  Result Date: 11/13/2021 CLINICAL DATA:  Abnormal chest x-ray EXAM: CT CHEST WITH CONTRAST TECHNIQUE: Multidetector CT imaging of the chest was performed during intravenous contrast administration. CONTRAST:  62mL OMNIPAQUE IOHEXOL 300 MG/ML  SOLN COMPARISON:  Same day chest x-ray FINDINGS: Cardiovascular: Normal heart size. Three-vessel coronary artery calcifications. Atherosclerotic disease of the thoracic aorta. No suspicious filling defects of the central  pulmonary arteries. Mediastinum/Nodes: Markedly enlarged right hilar lymph node, measuring up to 2.4 cm in short axis. Mildly enlarged right lower paratracheal lymph node measuring 1.0 cm in short axis on series 2, image 61. Rounded left hilar lymph node with central low density measuring 8 mm on series 2, image 88. Esophagus is unremarkable. Lungs/Pleura: Central airways are patent. Centrilobular emphysema. Spiculated left upper lobe pulmonary nodule measuring 2.5 x 1.5 cm on series 4, image 59 with adjacent satellite nodules measuring 5 mm on image 46 and 6 mm on image 51. Additional spiculated pulmonary nodule is seen more inferiorly in the right upper lobe which measures 0.9 x 0.7 cm on image 76. Intralobular septal thickening is seen in the peripheral right upper lobe adjacent to the nodules. Solid pulmonary nodule of the left upper lobe measuring 6 mm on series 4 image 70. Upper Abdomen: Nodular thickening of the left adrenal gland. Musculoskeletal: No chest wall abnormality. No acute or significant osseous findings. IMPRESSION: 1. Spiculated solid pulmonary nodules of the right upper lobe, largest measures 2.5 cm, findings are concerning for primary lung malignancy. 2. Intralobular septal thickening is seen in the peripheral right upper lobe adjacent to the nodules, concerning for lymphangitic spread. 3. Solid pulmonary nodule of the left upper lobe measuring 6 mm, concerning for additional site of disease. 4. Markedly enlarged right hilar lymph node and mildly enlarged right lower paratracheal lymph node, concerning for nodal metastatic disease. 5. Subcentimeter left hilar lymph node with rounded morphology, possibly an additional site of metastatic disease. 6. Nodular thickening of the left adrenal gland, concerning for metastatic disease. 7. Aortic Atherosclerosis (ICD10-I70.0) and Emphysema (ICD10-J43.9). Electronically Signed   By: Yetta Glassman M.D.   On: 11/13/2021 14:31   MR BRAIN W WO  CONTRAST  Result Date: 11/16/2021 CLINICAL DATA:  Assess treatment response. Interval resection of hemorrhagic mass. EXAM: MRI HEAD WITHOUT AND WITH CONTRAST TECHNIQUE: Multiplanar, multiecho pulse sequences of the brain and surrounding structures were obtained without and with intravenous contrast. CONTRAST:  5.35mL GADAVIST GADOBUTROL 1 MMOL/ML IV SOLN COMPARISON:  Preop brain MRI from 2 days ago FINDINGS: Brain: Resected right cerebellar mass with expected blood products and edema. Expected pneumocephalus which accounts for FLAIR signal changes at the vertex. No worrisome remaining enhancement. No complicating infarct or hydrocephalus. No newly detected mass. Vascular: Normal flow voids and dural sinus enhancement. Skull and upper cervical spine: Unremarkable craniotomy Sinuses/Orbits: Negative IMPRESSION: Resected cerebellar mass without complicating feature or visible residual. Electronically Signed   By: Jorje Guild M.D.   On: 11/16/2021 08:24   MR BRAIN W WO CONTRAST  Result Date: 11/14/2021 CLINICAL DATA:  Evaluate cerebellar hemorrhage. Concern for underlying mass EXAM: MRI HEAD WITHOUT AND WITH CONTRAST TECHNIQUE: Multiplanar, multiecho pulse sequences of the brain and surrounding structures were obtained without and with intravenous contrast. CONTRAST:  3mL GADAVIST GADOBUTROL 1 MMOL/ML IV SOLN COMPARISON:  Head CT from yesterday FINDINGS: Brain: Known hematoma in the anterior right cerebellum which shows thin mural enhancement and wispy anterior and internal enhancement. Overall dimensions  are 31 x 20 x 20 mm on postcontrast imaging. No second lesion is seen. Remote white matter insults in the frontal lobes with mineralization. No acute infarct, hydrocephalus, or collection. Vascular: Normal flow voids and vascular enhancements Skull and upper cervical spine: Normal marrow signal Sinuses/Orbits: Negative IMPRESSION: Mural and internal enhancement at the right cerebellar hematoma, suspect  underlying metastatic disease given chest findings. No second lesion. Electronically Signed   By: Jorje Guild M.D.   On: 11/14/2021 04:36   DG Chest Port 1 View  Result Date: 11/20/2021 CLINICAL DATA:  Sepsis. EXAM: PORTABLE CHEST 1 VIEW COMPARISON:  November 13, 2021. FINDINGS: The heart size and mediastinal contours are within normal limits. Stable nodular opacity is noted in right upper lobe concerning for malignancy. Stable right upper lobe airspace opacity is noted concerning for atelectasis or infiltrate. Left lung is clear. The visualized skeletal structures are unremarkable. IMPRESSION: Stable nodular opacity seen in right upper lobe concerning for malignancy. Stable right upper lobe airspace opacity is noted concerning for atelectasis or infiltrate. Electronically Signed   By: Marijo Conception M.D.   On: 11/20/2021 15:30   CT Angio Chest/Abd/Pel for Dissection W and/or W/WO  Result Date: 11/07/2021 CLINICAL DATA:  Shortness of breath and dizziness. History of lung cancer. EXAM: CT ANGIOGRAPHY CHEST, ABDOMEN AND PELVIS TECHNIQUE: Non-contrast CT of the chest was initially obtained. Multidetector CT imaging through the chest, abdomen and pelvis was performed using the standard protocol during bolus administration of intravenous contrast. Multiplanar reconstructed images and MIPs were obtained and reviewed to evaluate the vascular anatomy. CONTRAST:  39mL OMNIPAQUE IOHEXOL 350 MG/ML SOLN COMPARISON:  CT chest dated November 13, 2021. FINDINGS: CTA CHEST FINDINGS Cardiovascular: Preferential opacification of the thoracic aorta. No evidence of thoracic aortic aneurysm or dissection. Coronary, aortic arch, and branch vessel atherosclerotic vascular disease. Normal heart size. Unchanged trace pericardial effusion. No pulmonary embolism. Unchanged narrowing of the right upper lobe pulmonary artery due to hilar lymphadenopathy. Mediastinum/Nodes: New conglomerate right paratracheal lymphadenopathy  measuring up to 3.4 cm in short axis. Marked interval enlargement of a lower right paratracheal lymph node currently measuring 2.4 cm in short axis, previously 1.0 cm. Interval enlargement of a left hilar lymph node currently measuring 1.8 cm in short axis, previously 0.8 cm. Slight interval increase in size of an enlarged right hilar lymph node currently measuring 2.5 cm in short axis, previously 2.4 cm. New subcarinal lymphadenopathy measuring up to 1.8 cm in short axis. Multiple thyroid nodules are unchanged, including a 1.1 cm mixed cystic and solid nodule in the right thyroid lobe. Lungs/Pleura: New moderate to large bilateral pleural effusions. Dominant cavitary nodule in the right upper lobe currently measures 2.9 x 2.1 cm, previously 2.4 x 1.4 cm. Smaller satellite nodules in the right upper lobe have also mildly increased in size. Increased perilymphatic nodularity in the right upper lobe. Left greater than right lower lobe atelectasis. Mild dependent subsegmental atelectasis in the posterior left upper lobe. Unchanged 6 mm nodule in the anterior left upper lobe. No pneumothorax. Musculoskeletal: No chest wall abnormality. No acute or significant osseous findings. Significant Review of the MIP images confirms the above findings. CTA ABDOMEN AND PELVIS FINDINGS VASCULAR Aorta: Normal caliber aorta without aneurysm, dissection, vasculitis or significant stenosis. Moderate to severe atherosclerotic calcification. Celiac: Patent without evidence of aneurysm, dissection, vasculitis or significant stenosis. SMA: Patent without evidence of aneurysm, dissection, vasculitis or significant stenosis. Renals: Two right and single left renal arteries are patent without evidence of aneurysm,  dissection, vasculitis, fibromuscular dysplasia or significant stenosis. IMA: Patent without evidence of aneurysm, dissection, vasculitis or significant stenosis. Inflow: Patent without evidence of aneurysm, dissection, vasculitis or  significant stenosis. Veins: No obvious venous abnormality within the limitations of this arterial phase study. Review of the MIP images confirms the above findings. NON-VASCULAR Hepatobiliary: No focal liver abnormality is seen. No gallstones, gallbladder wall thickening, or biliary dilatation. Pancreas: New 2.5 x 2.1 cm ill-defined hypodense mass in the pancreatic tail (series 6, image 146). No pancreatic ductal dilatation or surrounding inflammatory changes. Spleen: Normal in size without focal abnormality. Adrenals/Urinary Tract: Significant interval increase in size of the left adrenal mass currently measuring 4.4 x 3.4 cm, previously 1.5 x 1.5 cm. New right adrenal mass measuring 3.9 x 1.8 cm. Normal kidneys. No renal calculi or hydronephrosis. The bladder is unremarkable for the degree of distention. Stomach/Bowel: Stomach is within normal limits. Appendix appears normal. No evidence of bowel wall thickening, distention, or inflammatory changes. Lymphatic: No enlarged abdominal or pelvic lymph nodes. Reproductive: Uterus and bilateral adnexa are unremarkable. Other: No free fluid or pneumoperitoneum. Musculoskeletal: No acute or significant osseous findings. Review of the MIP images confirms the above findings. IMPRESSION: 1. No evidence of acute aortic syndrome. 2. Significant lung cancer disease progression, with worsened lymphangitic carcinomatosis, mediastinal and hilar nodal metastases, and distant metastatic disease involving both adrenal glands and the pancreas. 3. New moderate to large bilateral pleural effusions. 4. Heterogeneous 1.1 cm right thyroid nodule.  Recommend thyroid US. 5. Aortic Atherosclerosis (ICD10-I70.0). Electronically Signed   By: Titus Dubin M.D.   On: 12/02/2021 13:51   CT BRAINLAB HEAD W/O CONTRAST (1MM)  Result Date: 11/14/2021 CLINICAL DATA:  Intracranial hemorrhage EXAM: CT HEAD WITHOUT CONTRAST TECHNIQUE: Contiguous axial images were obtained from the base of the  skull through the vertex without intravenous contrast. COMPARISON:  MRI same day.  Head CT yesterday. FINDINGS: Brain: Redemonstration of a hyperdense lesion in the right cerebellum with mass effect and surrounding edema. Based on the previous imaging, this is felt to represent a necrotic metastasis with internal hemorrhage. Hyperdense component measures slightly larger today, at about 3.5 x 2 x 1.8 cm, compared with maximal dimension of about 3.2 cm yesterday. Mild mass-effect upon the fourth ventricle but no ventricular obstruction. The brain otherwise appears normal. No second lesion is identified. No evidence of ischemic stroke. No hydrocephalus or extra-axial collection. Vascular: No abnormal vascular finding. Skull: Negative Sinuses/Orbits: Clear/normal Other: None IMPRESSION: Redemonstration of a hyperdense lesion in the right lateral cerebellum with surrounding edema, felt most consistent with a necrotic metastasis with internal hemorrhage. Greatest dimension of the lesion is slightly larger, 3.5 cm today compared with 3.2 cm on earlier imaging. Electronically Signed   By: Nelson Chimes M.D.   On: 11/14/2021 11:05     TODAY-DAY OF DISCHARGE:  Subjective:   Tanya Jordan today is lethargic-opens eyes to loud verbal stimuli.  Accumulating secretions in her throat.  Objective:   Blood pressure (!) 145/91, pulse 86, temperature 98.4 F (36.9 C), temperature source Axillary, resp. rate 18, last menstrual period 03/29/2011, SpO2 95 %. No intake or output data in the 24 hours ending 12/12/21 1117 There were no vitals filed for this visit.  Exam: Not in distress. [place].AT,PERRAL Supple Neck,No JVD, No cervical lymphadenopathy appriciated.  Symmetrical Chest wall movement, Good air movement bilaterally, CTAB RRR,No Gallops,Rubs or new Murmurs, No Parasternal Heave +ve B.Sounds, Abd Soft, Non tender, No organomegaly appriciated, No rebound -guarding or rigidity. No Cyanosis,  Clubbing or edema, No  new Rash or bruise   PERTINENT RADIOLOGIC STUDIES: No results found.   PERTINENT LAB RESULTS: CBC: Recent Labs    11/29/21 0424 11/30/21 0224  WBC 18.1* 18.2*  HGB 11.3* 10.5*  HCT 33.8* 30.8*  PLT 186 156   CMET CMP     Component Value Date/Time   NA 128 (L) 11/30/2021 0224   NA 140 05/15/2021 1447   K 3.4 (L) 11/30/2021 0224   CL 93 (L) 11/30/2021 0224   CO2 19 (L) 11/30/2021 0224   GLUCOSE 132 (H) 11/30/2021 0224   BUN 6 11/30/2021 0224   BUN 10 05/15/2021 1447   CREATININE 0.54 11/30/2021 0224   CREATININE 0.82 11/16/2016 0904   CALCIUM 8.0 (L) 11/30/2021 0224   PROT 5.5 (L) 11/02/2021 1118   PROT 7.5 05/15/2021 1447   ALBUMIN 2.2 (L) 11/02/2021 1118   ALBUMIN 4.7 05/15/2021 1447   AST 18 12/02/2021 1118   ALT 16 11/23/2021 1118   ALKPHOS 77 11/03/2021 1118   BILITOT 0.7 11/10/2021 1118   BILITOT 0.2 05/15/2021 1447   GFRNONAA >60 11/30/2021 0224   GFRNONAA 83 11/16/2016 0904   GFRAA 90 08/16/2020 1420   GFRAA >89 11/16/2016 0904    GFR CrCl cannot be calculated (Unknown ideal weight.). Recent Labs    11/27/2021 1118  LIPASE 89*   No results for input(s): CKTOTAL, CKMB, CKMBINDEX, TROPONINI in the last 72 hours. Invalid input(s): POCBNP No results for input(s): DDIMER in the last 72 hours. No results for input(s): HGBA1C in the last 72 hours. No results for input(s): CHOL, HDL, LDLCALC, TRIG, CHOLHDL, LDLDIRECT in the last 72 hours. No results for input(s): TSH, T4TOTAL, T3FREE, THYROIDAB in the last 72 hours.  Invalid input(s): FREET3 No results for input(s): VITAMINB12, FOLATE, FERRITIN, TIBC, IRON, RETICCTPCT in the last 72 hours. Coags: Recent Labs    11/10/2021 1118  INR 1.1   Microbiology: Recent Results (from the past 240 hour(s))  Resp Panel by RT-PCR (Flu A&B, Covid) Nasopharyngeal Swab     Status: None   Collection Time: 11/12/2021 12:21 PM   Specimen: Nasopharyngeal Swab; Nasopharyngeal(NP) swabs in vial transport medium  Result  Value Ref Range Status   SARS Coronavirus 2 by RT PCR NEGATIVE NEGATIVE Final    Comment: (NOTE) SARS-CoV-2 target nucleic acids are NOT DETECTED.  The SARS-CoV-2 RNA is generally detectable in upper respiratory specimens during the acute phase of infection. The lowest concentration of SARS-CoV-2 viral copies this assay can detect is 138 copies/mL. A negative result does not preclude SARS-Cov-2 infection and should not be used as the sole basis for treatment or other patient management decisions. A negative result may occur with  improper specimen collection/handling, submission of specimen other than nasopharyngeal swab, presence of viral mutation(s) within the areas targeted by this assay, and inadequate number of viral copies(<138 copies/mL). A negative result must be combined with clinical observations, patient history, and epidemiological information. The expected result is Negative.  Fact Sheet for Patients:  EntrepreneurPulse.com.au  Fact Sheet for Healthcare Providers:  IncredibleEmployment.be  This test is no t yet approved or cleared by the [place] FDA and  has been authorized for detection and/or diagnosis of SARS-CoV-2 by FDA under an Emergency Use Authorization (EUA). This EUA will remain  in effect (meaning this test can be used) for the duration of the COVID-19 declaration under Section 564(b)(1) of the Act, 21 U.S.C.section 360bbb-3(b)(1), unless the authorization is terminated  or revoked sooner.  Influenza A by PCR NEGATIVE NEGATIVE Final   Influenza B by PCR NEGATIVE NEGATIVE Final    Comment: (NOTE) The Xpert Xpress SARS-CoV-2/FLU/RSV plus assay is intended as an aid in the diagnosis of influenza from Nasopharyngeal swab specimens and should not be used as a sole basis for treatment. Nasal washings and aspirates are unacceptable for Xpert Xpress SARS-CoV-2/FLU/RSV testing.  Fact Sheet for  Patients: EntrepreneurPulse.com.au  Fact Sheet for Healthcare Providers: IncredibleEmployment.be  This test is not yet approved or cleared by the [place] FDA and has been authorized for detection and/or diagnosis of SARS-CoV-2 by FDA under an Emergency Use Authorization (EUA). This EUA will remain in effect (meaning this test can be used) for the duration of the COVID-19 declaration under Section 564(b)(1) of the Act, 21 U.S.C. section 360bbb-3(b)(1), unless the authorization is terminated or revoked.  Performed at [facility], [place] [address] [place], [place] [address]   Culture, blood (routine x 2)     Status: None (Preliminary result)   Collection Time: 11/08/2021 12:45 PM   Specimen: BLOOD  Result Value Ref Range Status   Specimen Description BLOOD RIGHT ANTECUBITAL  Final   Special Requests   Final    BOTTLES DRAWN AEROBIC AND ANAEROBIC Blood Culture results may not be optimal due to an inadequate volume of blood received in culture bottles   Culture   Final    NO GROWTH 3 DAYS Performed at [facility], [place] [address], [place],  [address]    Report Status PENDING  Incomplete  Culture, blood (routine x 2)     Status: None (Preliminary result)   Collection Time: 12/02/2021 12:49 PM   Specimen: BLOOD  Result Value Ref Range Status   Specimen Description BLOOD LEFT ANTECUBITAL  Final   Special Requests   Final    BOTTLES DRAWN AEROBIC AND ANAEROBIC Blood Culture adequate volume   Culture   Final    NO GROWTH 3 DAYS Performed at [facility], [place] [address], [place],  [address]    Report Status PENDING  Incomplete    FURTHER DISCHARGE INSTRUCTIONS:  Get Medicines reviewed and adjusted: Please take all your medications with you for your next visit with your Primary MD  Laboratory/radiological data: Please request your Primary MD to go over all hospital tests and procedure/radiological results at the  follow up, please ask your Primary MD to get all Hospital records sent to his/her office.  In some cases, they will be blood work, cultures and biopsy results pending at the time of your discharge. Please request that your primary care M.D. goes through all the records of your hospital data and follows up on these results.  Also Note the following: If you experience worsening of your admission symptoms, develop shortness of breath, life threatening emergency, suicidal or homicidal thoughts you must seek medical attention immediately by calling 911 or calling your MD immediately  if symptoms less severe.  You must read complete instructions/literature along with all the possible adverse reactions/side effects for all the Medicines you take and that have been prescribed to you. Take any new Medicines after you have completely understood and accpet all the possible adverse reactions/side effects.   Do not drive when taking Pain medications or sleeping medications (Benzodaizepines)  Do not take more than prescribed Pain, Sleep and Anxiety Medications. It is not advisable to combine anxiety,sleep and pain medications without talking with your primary care practitioner  Special Instructions: If you have  smoked or chewed Tobacco  in the last 2 yrs please stop smoking, stop any regular Alcohol  and or any Recreational drug use.  Wear Seat belts while driving.  Please note: You were cared for by a hospitalist during your hospital stay. Once you are discharged, your primary care physician will handle any further medical issues. Please note that NO REFILLS for any discharge medications will be authorized once you are discharged, as it is imperative that you return to your primary care physician (or establish a relationship with a primary care physician if you do not have one) for your post hospital discharge needs so that they can reassess your need for medications and monitor your lab values.  Total Time  spent coordinating discharge including counseling, education and face to face time equals 35 minutes.  SignedOren Binet 2021/12/03 11:17 AM

## 2021-12-03 NOTE — Death Summary Note (Signed)
DEATH SUMMARY   Patient Details  Name: Tanya Jordan MRN: [id number] DOB: 04/21/1966  Admission/Discharge Information   Admit Date:  2021-12-03  Date of Death: Date of Death: 12-06-21  Time of Death: Time of Death: Feb 15, 1200  Length of Stay: 3  Referring Physician: Charlott Rakes, MD   Reason(s) for Hospitalization   Fatigue/right arm weakness/hemoptysis   Diagnoses  Preliminary cause of death:  Secondary Diagnoses (including complications and co-morbidities):  Principal Problem:   Symptomatic anemia Active Problems:   Tobacco abuse   COPD with chronic bronchitis ([place])   Essential hypertension   Intracranial hemorrhage ([place])   Metastatic primary lung cancer ([place])   Hypokalemia   Elevated troponin   Hyponatremia   Chronic respiratory failure ([facility])   Dysuria   History of pneumonia   AF (paroxysmal atrial fibrillation) ([place])   Thyroid nodule   Brief Hospital Course (including significant findings, care, treatment, and services provided and events leading to death)   Brief Summary:  [name] who was recently diagnosed with lung CA-with right cerebellar metastatic lesion-s/p craniotomy and tumor resection on 12/14-presented to the hospital on 12/04/2023 with right arm weakness, fatigue, hemoptysis.  Upon further evaluation-she was found to have more's brain mets, bilateral pleural effusion, hypokalemia and severe anemia.  She was subsequently admitted to the hospitalist service.  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  Appreciate oncology/neurosurgery and palliative care team input-transitioned to comfort measures-patient  passed away with family at bedside on 07-Dec-2023.   Hemoptysis/pleural effusion: Due to underlying malignancy-managed with supportive care.   S/p right-sided craniotomy and right cerebellar tumor resection on 12/14: Did have some mild right arm weakness on presentation that has since resolved.     Hypokalemia: Repleted-no plans to recheck as she was on comfort measures.   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   Normocytic anemia: No history of melena/hematochezia-doubt active GI bleeding-FOBT positive but has been having intermittent hemoptysis.  Suspect anemia is from underlying malignancy.  Hemoglobin stable after 3 units of PRBC transfusion.  No plans to follow as she was on comfort measures.   Minimally elevated troponin: Doubt any clinical significance.  Not a candidate for further work-up given advanced malignancy in any event.   COPD with chronic hypoxic respiratory failure: On 4 L of oxygen at baseline.   Recent PNA: Was already on cefdinir prior to this hospitalization-has been discontinued she was transitioned to comfort measures.   PAF with RVR: Briefly required Lopressor.  Not a candidate for long-term anticoagulation.   Thyroid nodule: Incidental finding on CT   Tobacco abuse: Counseled-declined nicotine patch.     Goals of care/palliative care discussion: After extensive discussion-DNR-made comfort care.    BMI Estimated body mass index is 20.47 kg/m as calculated from the following:   Height as of 11/13/21: 5\' 4"  (1.626 m).   Weight as of 11/13/21: 54.1 kg.    Pertinent Labs and Studies  Significant Diagnostic Studies DG Chest 2 View  Result Date: 03-Dec-2021 CLINICAL DATA:  Shortness of breath EXAM: CHEST - 2 VIEW COMPARISON:  11/20/2021 radiographs and 11/13/2021 CT chest FINDINGS: Unchanged cardiac and mediastinal contours. Nodular opacity in the right upper lobe, which correlates with the known  right upper lobe  mass as well as likely the associated interlobular septal thickening seen on the prior CT, which has increased since the prior exam. Increased fullness in the right hilum. Trace right pleural effusion. Small left pleural effusion.  The left lung is otherwise clear. No acute osseous abnormality IMPRESSION: 1. Redemonstrated nodular opacity in the right upper lobe, correlating with the mass seen on the prior CT, with interval increase in surrounding opacities, likely related to the previously noted interlobular septal thickening. 2. Small left and trace right pleural effusions. 3. Increased right hilar fullness, likely worsening lymphadenopathy. Electronically Signed   By: Merilyn Baba M.D.   On: 11/07/2021 11:50   DG Chest 2 View  Result Date: 11/13/2021 CLINICAL DATA:  Vomiting.  Dizziness.  Sinus infection. EXAM: CHEST - 2 VIEW COMPARISON:  01/14/2016 FINDINGS: The heart size and mediastinal contours are within normal limits. 2.8 cm nodular opacity is seen in the right upper lobe. Another 1.5 cm nodular opacity is seen in the right lung base. New pulmonary infiltrate is seen in the inferior right upper lobe. No evidence of pleural effusion. Mild asymmetric soft tissue prominence is seen in the right hilum, suspicious for hilar lymphadenopathy. : Two right lung nodules and possible right hilar lymphadenopathy, raising suspicion for malignancy. Chest CT with contrast is recommended for further evaluation. New pulmonary infiltrate in inferior right upper lobe. Electronically Signed   By: Marlaine Hind M.D.   On: 11/13/2021 12:34   DG Lumbar Spine Complete  Result Date: 11/13/2021 CLINICAL DATA:  Back pain, possible malignancy on chest radiograph EXAM: LUMBAR SPINE - COMPLETE 4+ VIEW COMPARISON:  None. FINDINGS: There are 5 lumbar vertebral bodies. No pars break. Vertebral body heights and alignment are maintained. 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 Disc space narrowing is present and greatest at L5-S1.  Facet hypertrophy is greatest at lower lumbar levels. IMPRESSION: No compression fracture or large lesion. There is limited evaluation for malignancy by radiograph. 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 Electronically Signed   By: Macy Mis M.D.   On: 11/13/2021 13:31   CT HEAD WO CONTRAST (5MM)  Result Date: 11/10/2021 CLINICAL DATA:  Short of breath, dizziness, CNS neoplasm, lung cancer EXAM: CT HEAD WITHOUT CONTRAST TECHNIQUE: Contiguous axial images were obtained from the base of the skull through the vertex without intravenous contrast. COMPARISON:  11/16/2021, 11/14/2021 FINDINGS: Brain: Postsurgical changes from right occipital craniotomy with resection of the underlying hemorrhagic metastatic lesion seen previously. There is a small amount of residual blood products within the surgical bed. Continued edema and mass effect, with slight effacement of the fourth ventricle and right basilar cisterns. Since the prior exam, greater than 10 hemorrhagic metastatic lesions have developed throughout the brain parenchyma. The largest within the left frontal lobe measures 1.3 x 1.7 x 1.5 cm reference image 18/3. Mild surrounding vasogenic edema. There is no significant midline shift or mass effect however. The lateral ventricles are unremarkable. There are no acute extra-axial fluid collections. Vascular: No hyperdense vessel or unexpected calcification. Skull: Postsurgical changes from right occipital craniotomy. No acute or destructive bony lesions. Sinuses/Orbits: No acute finding. Other: None. IMPRESSION: 1. Interval progression of intracranial metastases, with multiple new hemorrhagic metastatic lesions throughout the bilateral cerebral hemispheres and cerebellum. Largest metastatic lesion in the left frontal lobe as above. 2. Postsurgical changes from right occipital hemorrhagic metastatic deposit resection, with minimal residual blood products and continued mass effect.  Critical Value/emergent results were called by telephone at the time of  interpretation on 11/24/2021 at 7:21 pm to provider [facility] , who verbally acknowledged these results. Electronically Signed   By: Randa Ngo M.D.   On: 11/05/2021 19:28   CT Head Wo Contrast  Result Date: 11/13/2021 CLINICAL DATA:  Weakness. EXAM: CT HEAD WITHOUT CONTRAST TECHNIQUE: Contiguous axial images were obtained from the base of the skull through the vertex without intravenous contrast. COMPARISON:  None. FINDINGS: Brain: 3 x 2 cm intraparenchymal hemorrhage is noted in the right cerebellar hemisphere. Ventricular size is within normal limits. No midline shift is noted. Vascular: No hyperdense vessel or unexpected calcification. Skull: Normal. Negative for fracture or focal lesion. Sinuses/Orbits: No acute finding. Other: None. IMPRESSION: 3 x 2 cm intraparenchymal hemorrhage is noted in right cerebellar hemisphere. Critical Value/emergent results were called by telephone at the time of interpretation on 11/13/2021 at 3:59 pm to provider Theodis Blaze , who verbally acknowledged these results. Electronically Signed   By: Marijo Conception M.D.   On: 11/13/2021 15:59   CT Chest W Contrast  Result Date: 11/13/2021 CLINICAL DATA:  Abnormal chest x-ray EXAM: CT CHEST WITH CONTRAST TECHNIQUE: Multidetector CT imaging of the chest was performed during intravenous contrast administration. CONTRAST:  49mL OMNIPAQUE IOHEXOL 300 MG/ML  SOLN COMPARISON:  Same day chest x-ray FINDINGS: Cardiovascular: Normal heart size. Three-vessel coronary artery calcifications. Atherosclerotic disease of the thoracic aorta. No suspicious filling defects of the central pulmonary arteries. Mediastinum/Nodes: Markedly enlarged right hilar lymph node, measuring up to 2.4 cm in short axis. Mildly enlarged right lower paratracheal lymph node measuring 1.0 cm in short axis on series 2, image 61. Rounded left hilar lymph node with central low density  measuring 8 mm on series 2, image 88. Esophagus is unremarkable. Lungs/Pleura: Central airways are patent. Centrilobular emphysema. Spiculated left upper lobe pulmonary nodule measuring 2.5 x 1.5 cm on series 4, image 59 with adjacent satellite nodules measuring 5 mm on image 46 and 6 mm on image 51. Additional spiculated pulmonary nodule is seen more inferiorly in the right upper lobe which measures 0.9 x 0.7 cm on image 76. Intralobular septal thickening is seen in the peripheral right upper lobe adjacent to the nodules. Solid pulmonary nodule of the left upper lobe measuring 6 mm on series 4 image 70. Upper Abdomen: Nodular thickening of the left adrenal gland. Musculoskeletal: No chest wall abnormality. No acute or significant osseous findings. IMPRESSION: 1. Spiculated solid pulmonary nodules of the right upper lobe, largest measures 2.5 cm, findings are concerning for primary lung malignancy. 2. Intralobular septal thickening is seen in the peripheral right upper lobe adjacent to the nodules, concerning for lymphangitic spread. 3. Solid pulmonary nodule of the left upper lobe measuring 6 mm, concerning for additional site of disease. 4. Markedly enlarged right hilar lymph node and mildly enlarged right lower paratracheal lymph node, concerning for nodal metastatic disease. 5. Subcentimeter left hilar lymph node with rounded morphology, possibly an additional site of metastatic disease. 6. Nodular thickening of the left adrenal gland, concerning for metastatic disease. 7. Aortic Atherosclerosis (ICD10-I70.0) and Emphysema (ICD10-J43.9). Electronically Signed   By: Yetta Glassman M.D.   On: 11/13/2021 14:31   MR BRAIN W WO CONTRAST  Result Date: 11/16/2021 CLINICAL DATA:  Assess treatment response. Interval resection of hemorrhagic mass. EXAM: MRI HEAD WITHOUT AND WITH CONTRAST TECHNIQUE: Multiplanar, multiecho pulse sequences of the brain and surrounding structures were obtained without and with  intravenous contrast. CONTRAST:  5.59mL GADAVIST GADOBUTROL 1 MMOL/ML IV SOLN COMPARISON:  Preop  brain MRI from 2 days ago FINDINGS: Brain: Resected right cerebellar mass with expected blood products and edema. Expected pneumocephalus which accounts for FLAIR signal changes at the vertex. No worrisome remaining enhancement. No complicating infarct or hydrocephalus. No newly detected mass. Vascular: Normal flow voids and dural sinus enhancement. Skull and upper cervical spine: Unremarkable craniotomy Sinuses/Orbits: Negative IMPRESSION: Resected cerebellar mass without complicating feature or visible residual. Electronically Signed   By: Jorje Guild M.D.   On: 11/16/2021 08:24   MR BRAIN W WO CONTRAST  Result Date: 11/14/2021 CLINICAL DATA:  Evaluate cerebellar hemorrhage. Concern for underlying mass EXAM: MRI HEAD WITHOUT AND WITH CONTRAST TECHNIQUE: Multiplanar, multiecho pulse sequences of the brain and surrounding structures were obtained without and with intravenous contrast. CONTRAST:  11mL GADAVIST GADOBUTROL 1 MMOL/ML IV SOLN COMPARISON:  Head CT from yesterday FINDINGS: Brain: Known hematoma in the anterior right cerebellum which shows thin mural enhancement and wispy anterior and internal enhancement. Overall dimensions are 31 x 20 x 20 mm on postcontrast imaging. No second lesion is seen. Remote white matter insults in the frontal lobes with mineralization. No acute infarct, hydrocephalus, or collection. Vascular: Normal flow voids and vascular enhancements Skull and upper cervical spine: Normal marrow signal Sinuses/Orbits: Negative IMPRESSION: Mural and internal enhancement at the right cerebellar hematoma, suspect underlying metastatic disease given chest findings. No second lesion. Electronically Signed   By: Jorje Guild M.D.   On: 11/14/2021 04:36   DG Chest Port 1 View  Result Date: 11/20/2021 CLINICAL DATA:  Sepsis. EXAM: PORTABLE CHEST 1 VIEW COMPARISON:  November 13, 2021.  FINDINGS: The heart size and mediastinal contours are within normal limits. Stable nodular opacity is noted in right upper lobe concerning for malignancy. Stable right upper lobe airspace opacity is noted concerning for atelectasis or infiltrate. Left lung is clear. The visualized skeletal structures are unremarkable. IMPRESSION: Stable nodular opacity seen in right upper lobe concerning for malignancy. Stable right upper lobe airspace opacity is noted concerning for atelectasis or infiltrate. Electronically Signed   By: Marijo Conception M.D.   On: 11/20/2021 15:30   CT Angio Chest/Abd/Pel for Dissection W and/or W/WO  Result Date: 11/29/2021 CLINICAL DATA:  Shortness of breath and dizziness. History of lung cancer. EXAM: CT ANGIOGRAPHY CHEST, ABDOMEN AND PELVIS TECHNIQUE: Non-contrast CT of the chest was initially obtained. Multidetector CT imaging through the chest, abdomen and pelvis was performed using the standard protocol during bolus administration of intravenous contrast. Multiplanar reconstructed images and MIPs were obtained and reviewed to evaluate the vascular anatomy. CONTRAST:  47mL OMNIPAQUE IOHEXOL 350 MG/ML SOLN COMPARISON:  CT chest dated November 13, 2021. FINDINGS: CTA CHEST FINDINGS Cardiovascular: Preferential opacification of the thoracic aorta. No evidence of thoracic aortic aneurysm or dissection. Coronary, aortic arch, and branch vessel atherosclerotic vascular disease. Normal heart size. Unchanged trace pericardial effusion. No pulmonary embolism. Unchanged narrowing of the right upper lobe pulmonary artery due to hilar lymphadenopathy. Mediastinum/Nodes: New conglomerate right paratracheal lymphadenopathy measuring up to 3.4 cm in short axis. Marked interval enlargement of a lower right paratracheal lymph node currently measuring 2.4 cm in short axis, previously 1.0 cm. Interval enlargement of a left hilar lymph node currently measuring 1.8 cm in short axis, previously 0.8 cm. Slight  interval increase in size of an enlarged right hilar lymph node currently measuring 2.5 cm in short axis, previously 2.4 cm. New subcarinal lymphadenopathy measuring up to 1.8 cm in short axis. Multiple thyroid nodules are unchanged, including a 1.1 cm mixed  cystic and solid nodule in the right thyroid lobe. Lungs/Pleura: New moderate to large bilateral pleural effusions. Dominant cavitary nodule in the right upper lobe currently measures 2.9 x 2.1 cm, previously 2.4 x 1.4 cm. Smaller satellite nodules in the right upper lobe have also mildly increased in size. Increased perilymphatic nodularity in the right upper lobe. Left greater than right lower lobe atelectasis. Mild dependent subsegmental atelectasis in the posterior left upper lobe. Unchanged 6 mm nodule in the anterior left upper lobe. No pneumothorax. Musculoskeletal: No chest wall abnormality. No acute or significant osseous findings. Significant Review of the MIP images confirms the above findings. CTA ABDOMEN AND PELVIS FINDINGS VASCULAR Aorta: Normal caliber aorta without aneurysm, dissection, vasculitis or significant stenosis. Moderate to severe atherosclerotic calcification. Celiac: Patent without evidence of aneurysm, dissection, vasculitis or significant stenosis. SMA: Patent without evidence of aneurysm, dissection, vasculitis or significant stenosis. Renals: Two right and single left renal arteries are patent without evidence of aneurysm, dissection, vasculitis, fibromuscular dysplasia or significant stenosis. IMA: Patent without evidence of aneurysm, dissection, vasculitis or significant stenosis. Inflow: Patent without evidence of aneurysm, dissection, vasculitis or significant stenosis. Veins: No obvious venous abnormality within the limitations of this arterial phase study. Review of the MIP images confirms the above findings. NON-VASCULAR Hepatobiliary: No focal liver abnormality is seen. No gallstones, gallbladder wall thickening, or  biliary dilatation. Pancreas: New 2.5 x 2.1 cm ill-defined hypodense mass in the pancreatic tail (series 6, image 146). No pancreatic ductal dilatation or surrounding inflammatory changes. Spleen: Normal in size without focal abnormality. Adrenals/Urinary Tract: Significant interval increase in size of the left adrenal mass currently measuring 4.4 x 3.4 cm, previously 1.5 x 1.5 cm. New right adrenal mass measuring 3.9 x 1.8 cm. Normal kidneys. No renal calculi or hydronephrosis. The bladder is unremarkable for the degree of distention. Stomach/Bowel: Stomach is within normal limits. Appendix appears normal. No evidence of bowel wall thickening, distention, or inflammatory changes. Lymphatic: No enlarged abdominal or pelvic lymph nodes. Reproductive: Uterus and bilateral adnexa are unremarkable. Other: No free fluid or pneumoperitoneum. Musculoskeletal: No acute or significant osseous findings. Review of the MIP images confirms the above findings. IMPRESSION: 1. No evidence of acute aortic syndrome. 2. Significant lung cancer disease progression, with worsened lymphangitic carcinomatosis, mediastinal and hilar nodal metastases, and distant metastatic disease involving both adrenal glands and the pancreas. 3. New moderate to large bilateral pleural effusions. 4. Heterogeneous 1.1 cm right thyroid nodule.  Recommend thyroid US. 5. Aortic Atherosclerosis (ICD10-I70.0). Electronically Signed   By: Titus Dubin M.D.   On: 11/20/2021 13:51   CT BRAINLAB HEAD W/O CONTRAST (1MM)  Result Date: 11/14/2021 CLINICAL DATA:  Intracranial hemorrhage EXAM: CT HEAD WITHOUT CONTRAST TECHNIQUE: Contiguous axial images were obtained from the base of the skull through the vertex without intravenous contrast. COMPARISON:  MRI same day.  Head CT yesterday. FINDINGS: Brain: Redemonstration of a hyperdense lesion in the right cerebellum with mass effect and surrounding edema. Based on the previous imaging, this is felt to represent a  necrotic metastasis with internal hemorrhage. Hyperdense component measures slightly larger today, at about 3.5 x 2 x 1.8 cm, compared with maximal dimension of about 3.2 cm yesterday. Mild mass-effect upon the fourth ventricle but no ventricular obstruction. The brain otherwise appears normal. No second lesion is identified. No evidence of ischemic stroke. No hydrocephalus or extra-axial collection. Vascular: No abnormal vascular finding. Skull: Negative Sinuses/Orbits: Clear/normal Other: None IMPRESSION: Redemonstration of a hyperdense lesion in the right lateral cerebellum with  surrounding edema, felt most consistent with a necrotic metastasis with internal hemorrhage. Greatest dimension of the lesion is slightly larger, 3.5 cm today compared with 3.2 cm on earlier imaging. Electronically Signed   By: Nelson Chimes M.D.   On: 11/14/2021 11:05    Microbiology Recent Results (from the past 240 hour(s))  Resp Panel by RT-PCR (Flu A&B, Covid) Nasopharyngeal Swab     Status: None   Collection Time: 11/24/2021 12:21 PM   Specimen: Nasopharyngeal Swab; Nasopharyngeal(NP) swabs in vial transport medium  Result Value Ref Range Status   SARS Coronavirus 2 by RT PCR NEGATIVE NEGATIVE Final    Comment: (NOTE) SARS-CoV-2 target nucleic acids are NOT DETECTED.  The SARS-CoV-2 RNA is generally detectable in upper respiratory specimens during the acute phase of infection. The lowest concentration of SARS-CoV-2 viral copies this assay can detect is 138 copies/mL. A negative result does not preclude SARS-Cov-2 infection and should not be used as the sole basis for treatment or other patient management decisions. A negative result may occur with  improper specimen collection/handling, submission of specimen other than nasopharyngeal swab, presence of viral mutation(s) within the areas targeted by this assay, and inadequate number of viral copies(<138 copies/mL). A negative result must be combined with clinical  observations, patient history, and epidemiological information. The expected result is Negative.  Fact Sheet for Patients:  EntrepreneurPulse.com.au  Fact Sheet for Healthcare Providers:  IncredibleEmployment.be  This test is no t yet approved or cleared by the [place] FDA and  has been authorized for detection and/or diagnosis of SARS-CoV-2 by FDA under an Emergency Use Authorization (EUA). This EUA will remain  in effect (meaning this test can be used) for the duration of the COVID-19 declaration under Section 564(b)(1) of the Act, 21 U.S.C.section 360bbb-3(b)(1), unless the authorization is terminated  or revoked sooner.       Influenza A by PCR NEGATIVE NEGATIVE Final   Influenza B by PCR NEGATIVE NEGATIVE Final    Comment: (NOTE) The Xpert Xpress SARS-CoV-2/FLU/RSV plus assay is intended as an aid in the diagnosis of influenza from Nasopharyngeal swab specimens and should not be used as a sole basis for treatment. Nasal washings and aspirates are unacceptable for Xpert Xpress SARS-CoV-2/FLU/RSV testing.  Fact Sheet for Patients: EntrepreneurPulse.com.au  Fact Sheet for Healthcare Providers: IncredibleEmployment.be  This test is not yet approved or cleared by the [place] FDA and has been authorized for detection and/or diagnosis of SARS-CoV-2 by FDA under an Emergency Use Authorization (EUA). This EUA will remain in effect (meaning this test can be used) for the duration of the COVID-19 declaration under Section 564(b)(1) of the Act, 21 U.S.C. section 360bbb-3(b)(1), unless the authorization is terminated or revoked.  Performed at [facility], [place] [address] [place], [place] [address]   Culture, blood (routine x 2)     Status: None (Preliminary result)   Collection Time: 11/24/2021 12:45 PM   Specimen: BLOOD  Result Value Ref Range Status   Specimen Description BLOOD RIGHT  ANTECUBITAL  Final   Special Requests   Final    BOTTLES DRAWN AEROBIC AND ANAEROBIC Blood Culture results may not be optimal due to an inadequate volume of blood received in culture bottles   Culture   Final    NO GROWTH 3 DAYS Performed at [facility], [place] [address] [place], [place] [address]    Report Status PENDING  Incomplete  Culture, blood (routine x 2)     Status: None (  Preliminary result)   Collection Time: 11/22/2021 12:49 PM   Specimen: BLOOD  Result Value Ref Range Status   Specimen Description BLOOD LEFT ANTECUBITAL  Final   Special Requests   Final    BOTTLES DRAWN AEROBIC AND ANAEROBIC Blood Culture adequate volume   Culture   Final    NO GROWTH 3 DAYS Performed at [facility], [address], [place], [place] [id number]    Report Status PENDING  Incomplete    Lab Basic Metabolic Panel: Recent Labs  Lab 11/15/2021 1118 11/21/2021 1245 11/29/21 0424 11/30/21 0224  NA 131*  --  133* 128*  K 2.6*  --  3.4* 3.4*  CL 92*  --  95* 93*  CO2 28  --  23 19*  GLUCOSE 112*  --  104* 132*  BUN <5*  --  5* 6  CREATININE 0.50  --  0.50 0.54  CALCIUM 8.0*  --  8.0* 8.0*  MG  --  1.8  --  1.8  PHOS  --  2.7  --   --    Liver Function Tests: Recent Labs  Lab 11/18/2021 1118  AST 18  ALT 16  ALKPHOS 77  BILITOT 0.7  PROT 5.5*  ALBUMIN 2.2*   Recent Labs  Lab 11/03/2021 1118  LIPASE 89*   Recent Labs  Lab 11/16/2021 1245  AMMONIA 18   CBC: Recent Labs  Lab 11/22/2021 1118 11/29/21 0424 11/30/21 0224  WBC 12.6* 18.1* 18.2*  NEUTROABS 10.9*  --   --   HGB 5.7* 11.3* 10.5*  HCT 17.8* 33.8* 30.8*  MCV 88.1 85.8 83.7  PLT 263 186 156   Cardiac Enzymes: No results for input(s): CKTOTAL, CKMB, CKMBINDEX, TROPONINI in the last 168 hours. Sepsis Labs: Recent Labs  Lab 11/15/2021 1118 11/27/2021 1245 11/29/21 0424 11/30/21 0224  WBC 12.6*  --  18.1* 18.2*  LATICACIDVEN  --  1.1  --   --     Procedures/Operations    Charika Mikelson 2021/12/26,  2:12 PM

## 2021-12-03 NOTE — Progress Notes (Signed)
Spoke with representative, Tonye Becket from [employer] @ [phone]. Reference number: [id number].

## 2021-12-03 DEATH — deceased

## 2021-12-05 ENCOUNTER — Ambulatory Visit: Payer: 59

## 2021-12-05 ENCOUNTER — Ambulatory Visit: Payer: 59 | Admitting: Radiation Oncology

## 2021-12-05 ENCOUNTER — Ambulatory Visit: Payer: Self-pay

## 2021-12-05 ENCOUNTER — Ambulatory Visit: Admit: 2021-12-05 | Payer: Self-pay | Admitting: Radiation Oncology

## 2021-12-06 ENCOUNTER — Ambulatory Visit (HOSPITAL_COMMUNITY): Payer: 59

## 2021-12-08 ENCOUNTER — Ambulatory Visit: Payer: 59 | Admitting: Radiation Oncology

## 2021-12-08 ENCOUNTER — Encounter: Payer: Self-pay | Admitting: *Deleted

## 2021-12-08 NOTE — Progress Notes (Signed)
I followed up on Tanya Jordan's information. I noticed she passed away. I called [facility] to cancel molecular testing.  I was told they will cancel test.

## 2021-12-12 ENCOUNTER — Ambulatory Visit: Payer: 59 | Admitting: Internal Medicine

## 2021-12-12 ENCOUNTER — Other Ambulatory Visit: Payer: 59

## 2021-12-12 ENCOUNTER — Encounter (HOSPITAL_COMMUNITY): Payer: Self-pay

## 2023-09-30 IMAGING — CT CT HEAD W/O CM
3 series · 15 of 47 positions shown, 18 images · non-contrast
Comparison: 11/16/2021, 11/14/2021

CLINICAL DATA: Short of breath, dizziness, CNS neoplasm, lung
cancer

EXAM:
CT HEAD WITHOUT CONTRAST
TECHNIQUE: Contiguous axial images were obtained from the base of the skull
through the vertex without intravenous contrast.

[Series 3: head 5.0 h30s · axial · 0.44mm/px · z∈[-180,-55]mm · 9 of 31 slices shown, 12 images]
[im 3/31  brain]
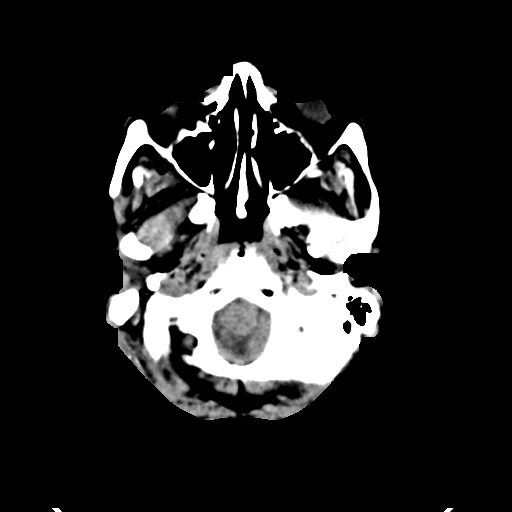
[im 3/31  bone]
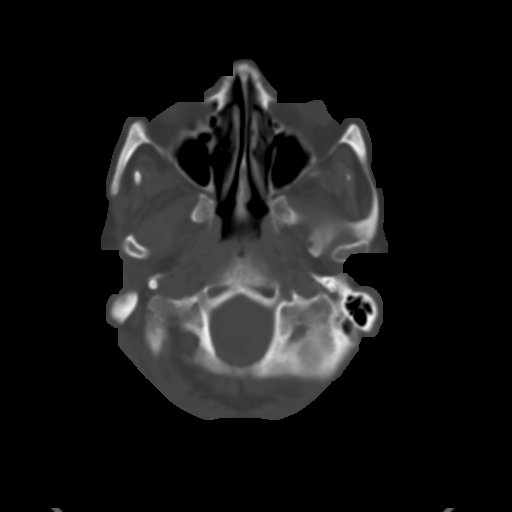
[im 6/31  brain]
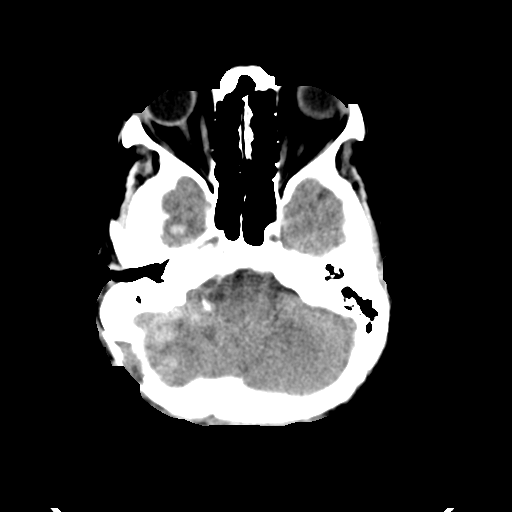
[im 9/31  brain]
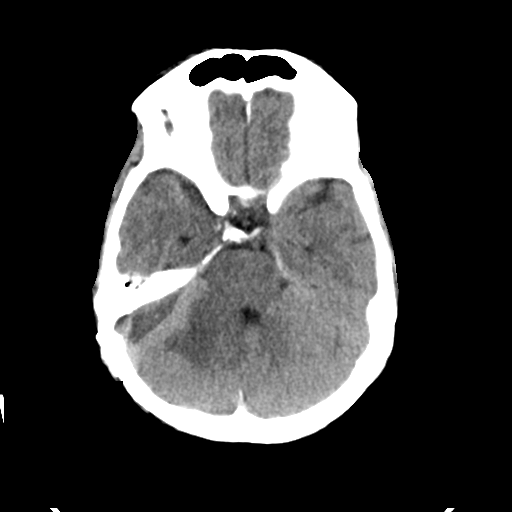
[im 12/31  brain]
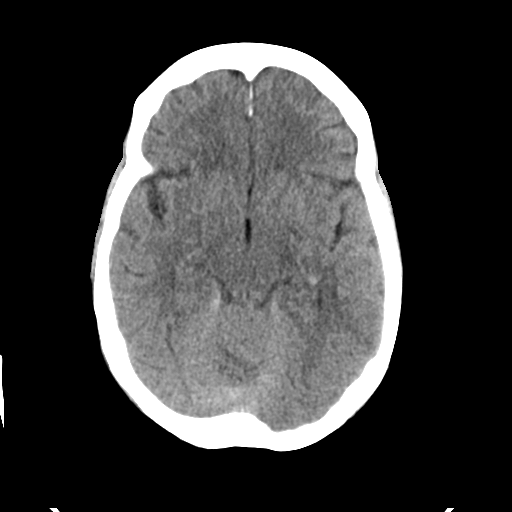
[im 16/31  brain]
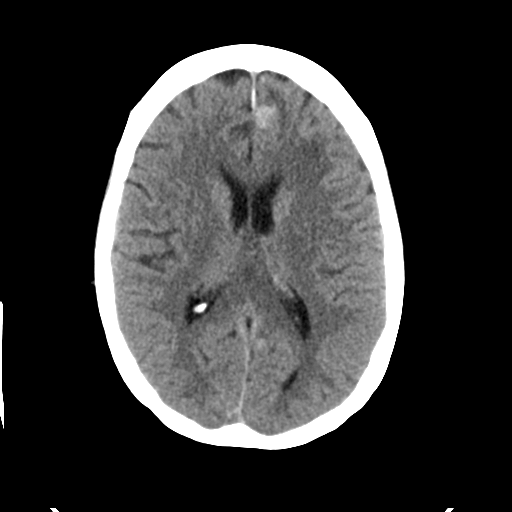
[im 16/31  bone]
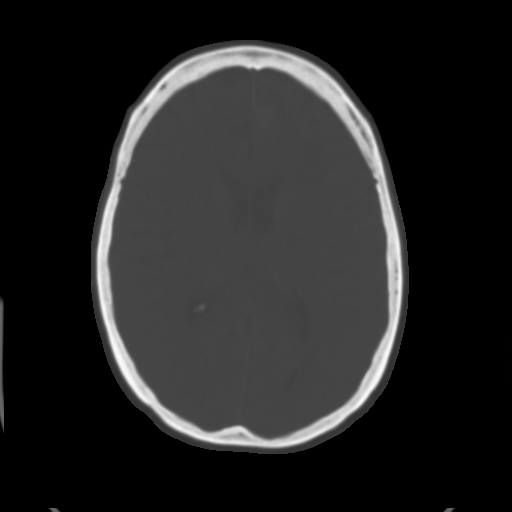
[im 19/31  brain]
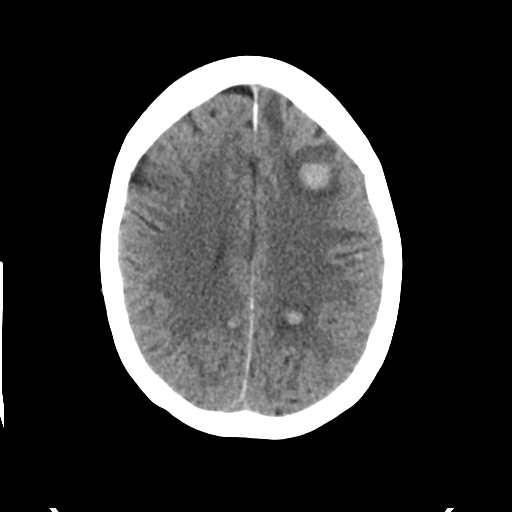
[im 22/31  brain]
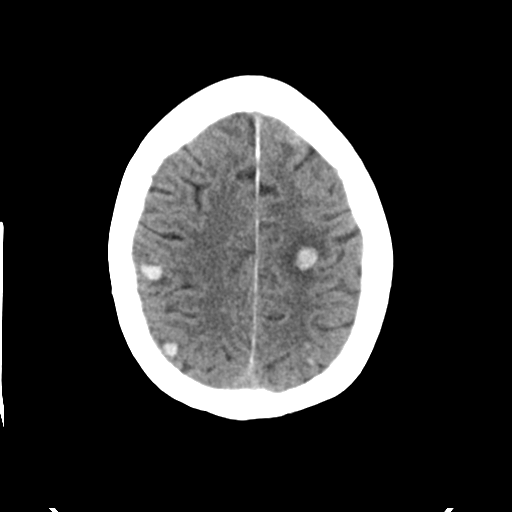
[im 25/31  brain]
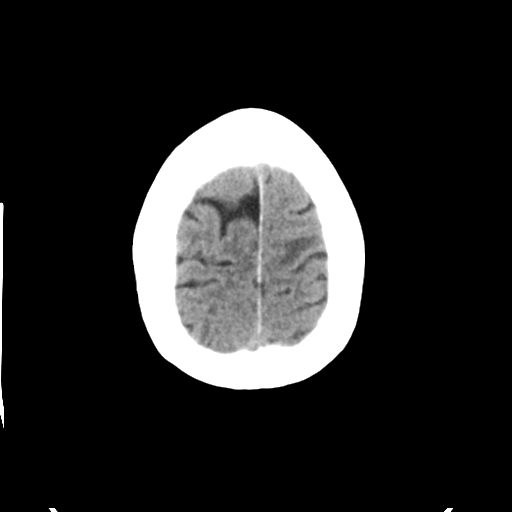
[im 28/31  brain]
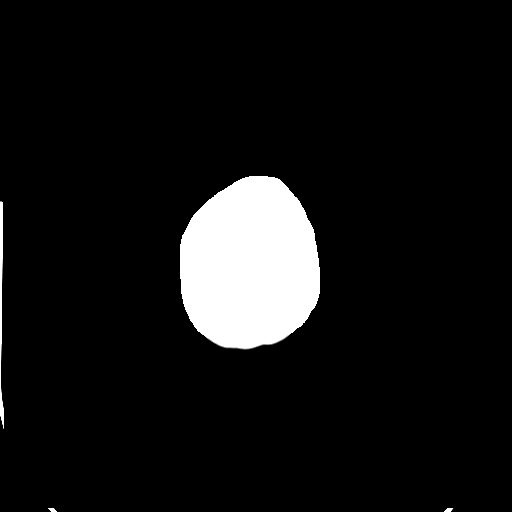
[im 28/31  bone]
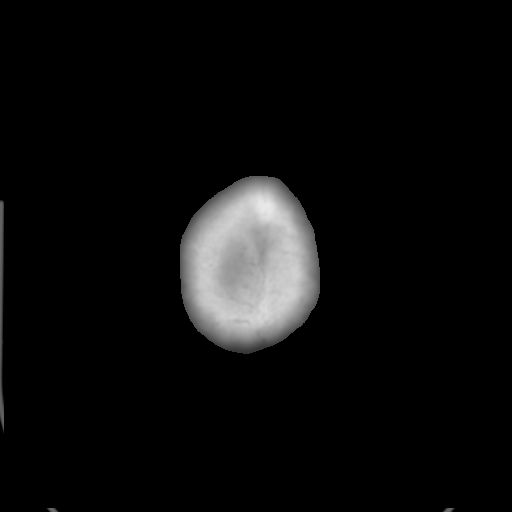

[Series 5: head 3.0 mpr cor · coronal · 0.32mm/px · 3 of 67 slices shown]
[im 23/67  brain]
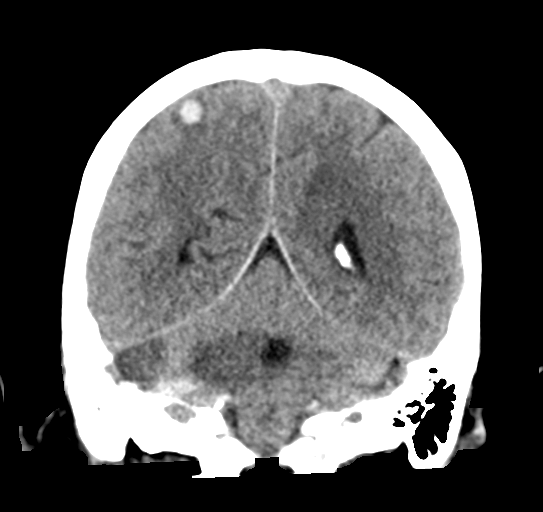
[im 30/67  brain]
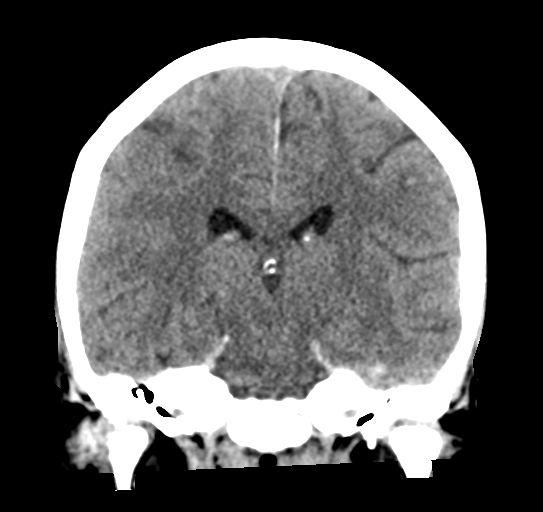
[im 37/67  brain]
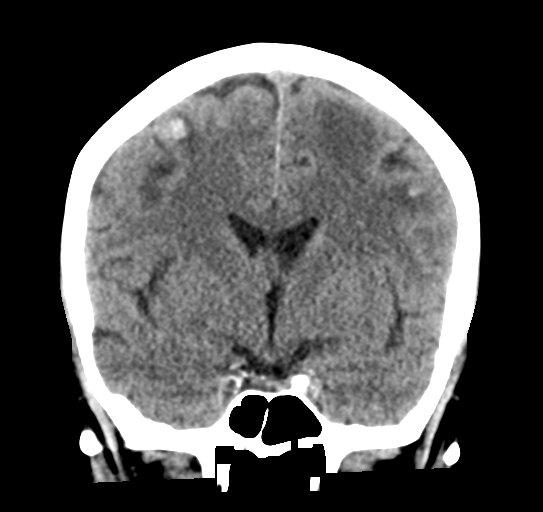

[Series 6: head 3.0 mpr sag · sagittal · 0.30mm/px · 3 of 54 slices shown]
[im 18/54  brain]
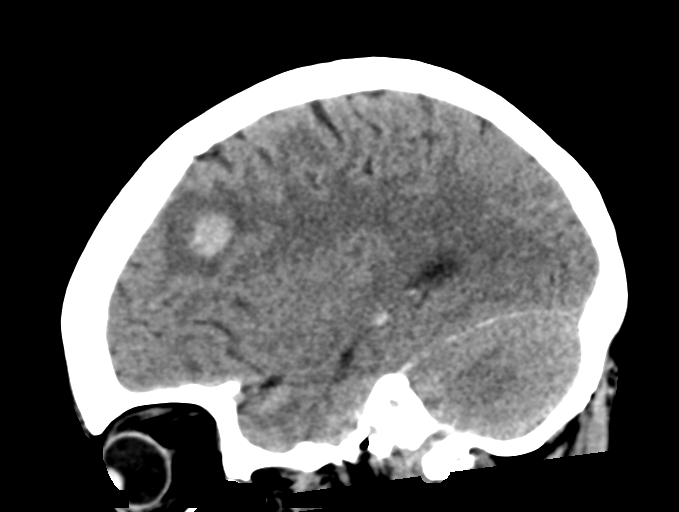
[im 27/54  brain]
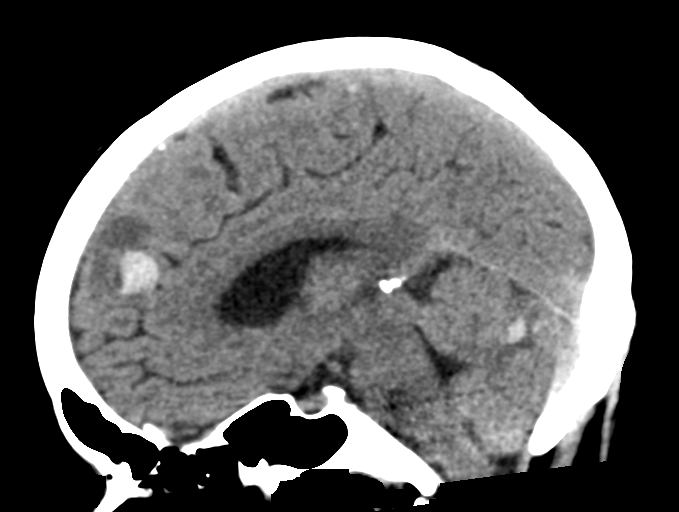
[im 36/54  brain]
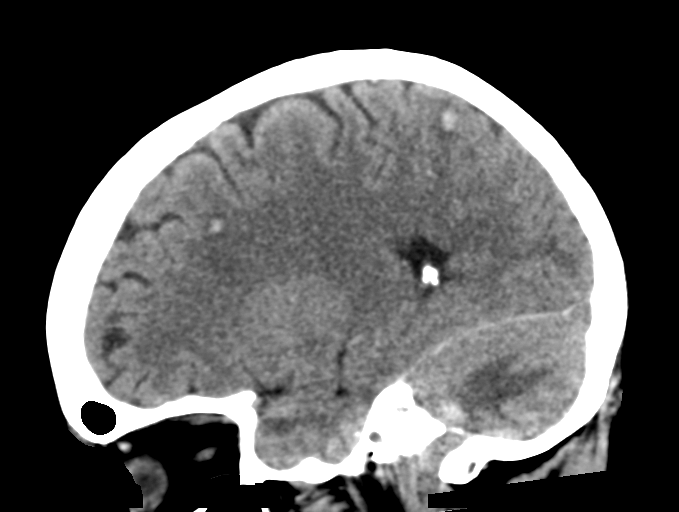

[15 of 47 positions shown; findings below may reference images not displayed]

FINDINGS: Brain: Postsurgical changes from right occipital craniotomy with
resection of the underlying hemorrhagic metastatic lesion seen
previously. There is a small amount of residual blood products
within the surgical bed. Continued edema and mass effect, with
slight effacement of the fourth ventricle and right basilar
cisterns.

Since the prior exam, greater than 10 hemorrhagic metastatic lesions
have developed throughout the brain parenchyma. The largest within
the left frontal lobe measures 1.3 x 1.7 x 1.5 cm reference image
[DATE]. Mild surrounding vasogenic edema. There is no significant
midline shift or mass effect however.

The lateral ventricles are unremarkable. There are no acute
extra-axial fluid collections.

Vascular: No hyperdense vessel or unexpected calcification.

Skull: Postsurgical changes from right occipital craniotomy. No
acute or destructive bony lesions.

Sinuses/Orbits: No acute finding.

Other: None.
IMPRESSION: 1. Interval progression of intracranial metastases, with multiple
new hemorrhagic metastatic lesions throughout the bilateral cerebral
hemispheres and cerebellum. Largest metastatic lesion in the left
frontal lobe as above.
2. Postsurgical changes from right occipital hemorrhagic metastatic
deposit resection, with minimal residual blood products and
continued mass effect.

Critical Value/emergent results were called by telephone at the time
of interpretation on 11/28/2021 at [DATE] to provider EBADAT TIGER
, who verbally acknowledged these results.

## 2023-09-30 IMAGING — CR DG CHEST 2V
2 series · 2 of 2 positions shown · non-contrast
Comparison: 11/20/2021 radiographs and 11/13/2021 CT chest

CLINICAL DATA: Shortness of breath

EXAM:
CHEST - 2 VIEW

[chest lat]
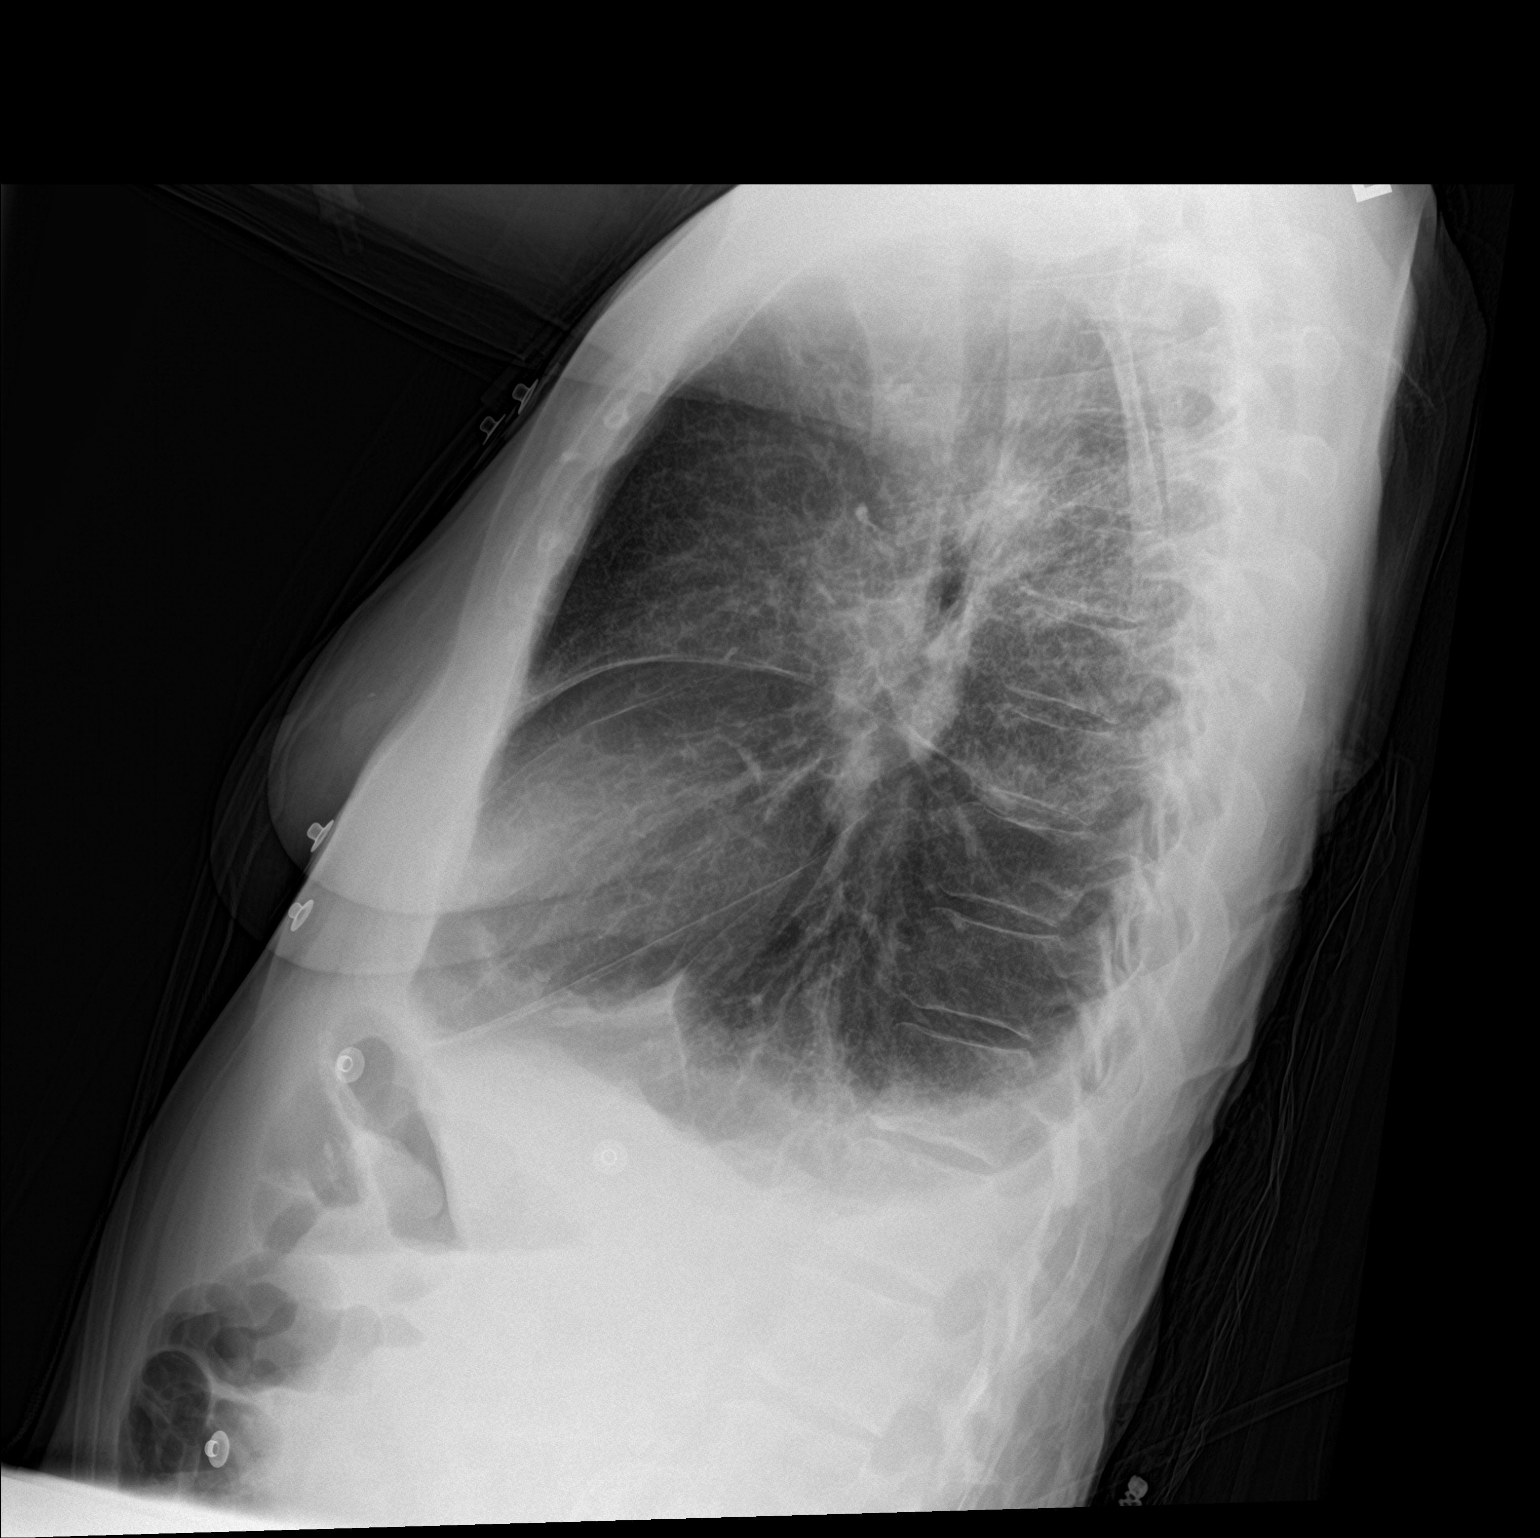

[chest ap]
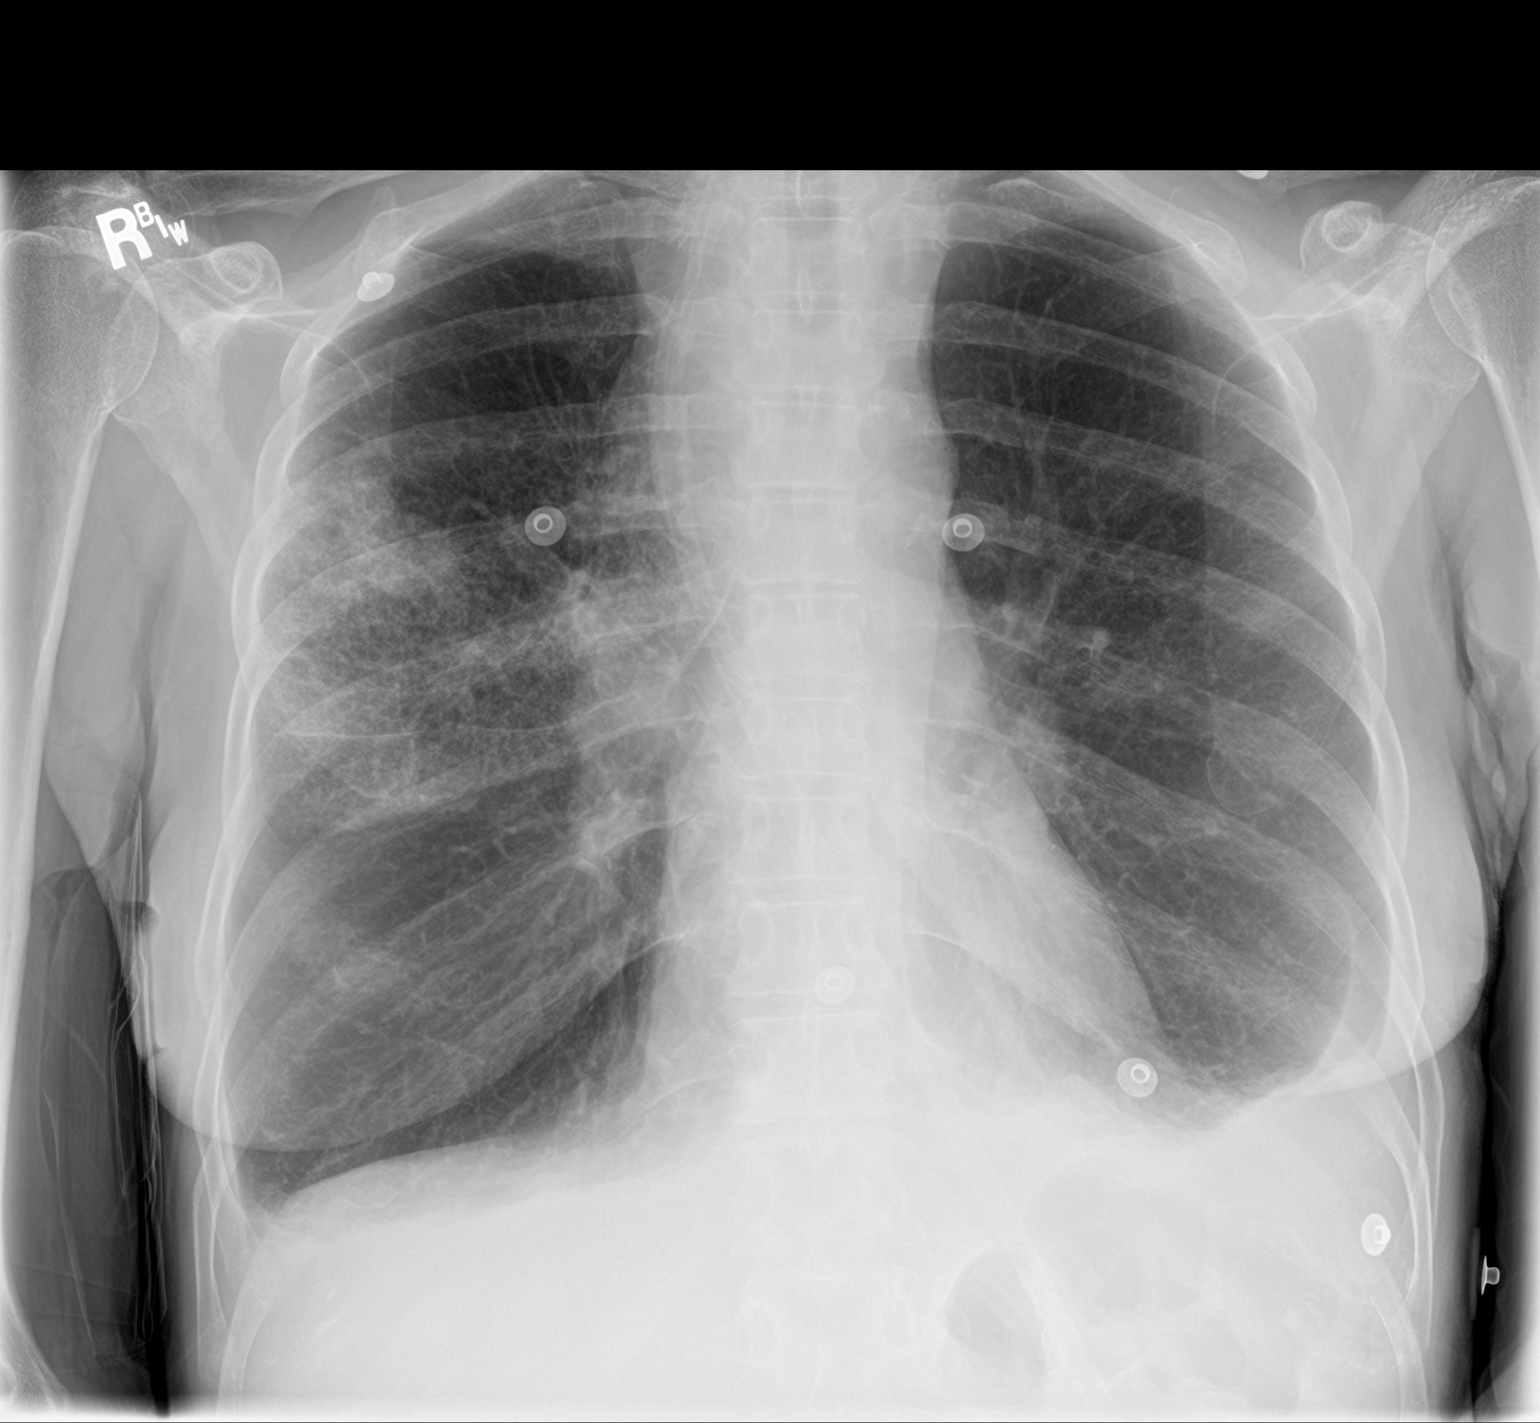

[2 of 2 positions shown; findings below may reference images not displayed]

FINDINGS: Unchanged cardiac and mediastinal contours.

Nodular opacity in the right upper lobe, which correlates with the
known right upper lobe mass as well as likely the associated
interlobular septal thickening seen on the prior CT, which has
increased since the prior exam. Increased fullness in the right
hilum. Trace right pleural effusion.

Small left pleural effusion.  The left lung is otherwise clear.

No acute osseous abnormality
IMPRESSION: 1. Redemonstrated nodular opacity in the right upper lobe,
correlating with the mass seen on the prior CT, with interval
increase in surrounding opacities, likely related to the previously
noted interlobular septal thickening.
2. Small left and trace right pleural effusions.
3. Increased right hilar fullness, likely worsening lymphadenopathy.
